# Patient Record
Sex: Female | Born: 1935 | Race: White | Hispanic: No | Smoking: Never smoker
Health system: Southern US, Community
[De-identification: ages and names within clinical notes are randomized; demographics above are authoritative.]

## PROBLEM LIST (undated history)

## (undated) DIAGNOSIS — F32A Depression, unspecified: Secondary | ICD-10-CM

## (undated) DIAGNOSIS — E785 Hyperlipidemia, unspecified: Secondary | ICD-10-CM

## (undated) DIAGNOSIS — F329 Major depressive disorder, single episode, unspecified: Secondary | ICD-10-CM

## (undated) DIAGNOSIS — I10 Essential (primary) hypertension: Secondary | ICD-10-CM

## (undated) DIAGNOSIS — S72002A Fracture of unspecified part of neck of left femur, initial encounter for closed fracture: Secondary | ICD-10-CM

## (undated) DIAGNOSIS — E119 Type 2 diabetes mellitus without complications: Secondary | ICD-10-CM

## (undated) DIAGNOSIS — F0390 Unspecified dementia without behavioral disturbance: Secondary | ICD-10-CM

## (undated) DIAGNOSIS — I509 Heart failure, unspecified: Secondary | ICD-10-CM

## (undated) DIAGNOSIS — D649 Anemia, unspecified: Secondary | ICD-10-CM

## (undated) DIAGNOSIS — I4891 Unspecified atrial fibrillation: Secondary | ICD-10-CM

## (undated) HISTORY — DX: Type 2 diabetes mellitus without complications: E11.9

## (undated) HISTORY — DX: Fracture of unspecified part of neck of left femur, initial encounter for closed fracture: S72.002A

## (undated) HISTORY — DX: Major depressive disorder, single episode, unspecified: F32.9

## (undated) HISTORY — DX: Unspecified atrial fibrillation: I48.91

## (undated) HISTORY — PX: ABDOMINAL HYSTERECTOMY: SHX81

## (undated) HISTORY — PX: INTERTROCHANTERIC HIP FRACTURE SURGERY: SHX681

## (undated) HISTORY — DX: Anemia, unspecified: D64.9

## (undated) HISTORY — DX: Hyperlipidemia, unspecified: E78.5

## (undated) HISTORY — DX: Depression, unspecified: F32.A

---

## 2001-09-23 HISTORY — PX: COLONOSCOPY: SHX174

## 2004-05-25 ENCOUNTER — Ambulatory Visit (HOSPITAL_COMMUNITY): Admission: RE | Admit: 2004-05-25 | Discharge: 2004-05-25 | Payer: Self-pay | Admitting: Family Medicine

## 2004-09-23 DIAGNOSIS — S72002A Fracture of unspecified part of neck of left femur, initial encounter for closed fracture: Secondary | ICD-10-CM

## 2004-09-23 HISTORY — DX: Fracture of unspecified part of neck of left femur, initial encounter for closed fracture: S72.002A

## 2005-05-28 ENCOUNTER — Encounter: Payer: Self-pay | Admitting: Emergency Medicine

## 2005-05-29 ENCOUNTER — Ambulatory Visit: Payer: Self-pay | Admitting: Physical Medicine & Rehabilitation

## 2005-05-29 ENCOUNTER — Inpatient Hospital Stay (HOSPITAL_COMMUNITY): Admission: EM | Admit: 2005-05-29 | Discharge: 2005-06-05 | Payer: Self-pay | Admitting: Emergency Medicine

## 2005-06-05 ENCOUNTER — Inpatient Hospital Stay
Admission: RE | Admit: 2005-06-05 | Discharge: 2005-06-13 | Payer: Self-pay | Admitting: Physical Medicine & Rehabilitation

## 2006-03-06 ENCOUNTER — Ambulatory Visit (HOSPITAL_COMMUNITY): Admission: RE | Admit: 2006-03-06 | Discharge: 2006-03-06 | Payer: Self-pay | Admitting: Family Medicine

## 2011-06-06 ENCOUNTER — Encounter: Payer: Self-pay | Admitting: *Deleted

## 2011-06-06 ENCOUNTER — Emergency Department (HOSPITAL_COMMUNITY)
Admission: EM | Admit: 2011-06-06 | Discharge: 2011-06-07 | Disposition: A | Discharge status: Home / Self Care | Payer: Medicare Other | Attending: Emergency Medicine | Admitting: Emergency Medicine

## 2011-06-06 DIAGNOSIS — I10 Essential (primary) hypertension: Secondary | ICD-10-CM | POA: Insufficient documentation

## 2011-06-06 DIAGNOSIS — E119 Type 2 diabetes mellitus without complications: Secondary | ICD-10-CM | POA: Insufficient documentation

## 2011-06-06 DIAGNOSIS — L0390 Cellulitis, unspecified: Secondary | ICD-10-CM

## 2011-06-06 DIAGNOSIS — T148 Other injury of unspecified body region: Secondary | ICD-10-CM | POA: Insufficient documentation

## 2011-06-06 DIAGNOSIS — W57XXXA Bitten or stung by nonvenomous insect and other nonvenomous arthropods, initial encounter: Secondary | ICD-10-CM | POA: Insufficient documentation

## 2011-06-06 HISTORY — DX: Essential (primary) hypertension: I10

## 2011-06-06 MED ORDER — DOXYCYCLINE HYCLATE 100 MG PO TABS
100.0000 mg | ORAL_TABLET | Freq: Once | ORAL | Status: AC
  Administered 2011-06-06: 100 mg via ORAL
  Filled 2011-06-06: qty 1

## 2011-06-06 MED ORDER — DOXYCYCLINE HYCLATE 100 MG PO CAPS: 100.0000 mg | ORAL_CAPSULE | Freq: Two times a day (BID) | ORAL | Status: AC

## 2011-06-06 MED ORDER — ACETAMINOPHEN 500 MG PO TABS
1000.0000 mg | ORAL_TABLET | Freq: Once | ORAL | Status: AC
  Administered 2011-06-07: 1000 mg via ORAL
  Filled 2011-06-06: qty 2

## 2011-06-06 NOTE — ED Provider Notes (Signed)
Medical screening examination/treatment/procedure(s) were conducted as a shared visit with non-physician practitioner(s) and myself.  I personally evaluated the patient during the encounter  Several days of worsening erythema to the right antecubital fossa, right shoulder. This is warm, associated with fevers and associated with insect bites. Symptoms are mild, persistent nothing makes better or worse  Physical exam: Well appearing, no distress, erythematous rash consistent with cellulitis to the right antecubital fossa and the right posterior shoulder. Lungs and heart was normal exam, mentation normal, gait normal  Assessment, cellulitis of multiple sites, benign-appearing without any toxic appearance,  Plan: Antibiotics, outlined area with marker, followup with family doctor in one to 2 days.  Vida Roller, MD 06/07/11 0000

## 2011-06-06 NOTE — ED Provider Notes (Signed)
History     CSN: [id number] Arrival date & time: 06/06/2011 11:25 PM   Chief Complaint  Patient presents with  . Rash     (Include location/radiation/quality/duration/timing/severity/associated sxs/prior treatment) HPI Comments: Pt describes deer ticks biting her.  Estimates size ~ 1/16th inch.  Patient is a 77 y.o. female presenting with rash. The history is provided by the spouse, a relative and the patient. No language interpreter was used.  Rash  This is a new problem. The current episode started yesterday. The problem has been gradually worsening. The problem is associated with an insect bite/sting (tick bites). There has been no fever. The rash is present on the right arm and back (also midline upper thoracic area.). The patient is experiencing no pain. Associated symptoms include itching. She has tried nothing for the symptoms.     Past Medical History  Diagnosis Date  . Hypertension   . Diabetes mellitus      Past Surgical History  Procedure Date  . Abdominal hysterectomy   . Hip fracture surgery     History reviewed. No pertinent family history.  History  Substance Use Topics  . Smoking status: Never Smoker   . Smokeless tobacco: Not on file  . Alcohol Use: No    OB History    Grav Para Term Preterm Abortions TAB SAB Ect Mult Living                  Review of Systems  Constitutional: Negative for fever and chills.  Skin: Positive for itching and rash.  All other systems reviewed and are negative.    Allergies  Codeine and Merthiolate (new formula)  Home Medications   Current Outpatient Rx  Name Route Sig Dispense Refill  . AMLODIPINE BESYLATE 10 MG PO TABS Oral Take 10 mg by mouth daily.      . ASPIRIN 81 MG PO TABS Oral Take 81 mg by mouth daily.      . ATORVASTATIN CALCIUM 40 MG PO TABS Oral Take 40 mg by mouth daily.      . GLYBURIDE-METFORMIN 5-500 MG PO TABS Oral Take 1 tablet by mouth daily with breakfast.      . PAROXETINE HCL 20 MG PO  TABS Oral Take 20 mg by mouth every morning.      Marland Kitchen RAMIPRIL 10 MG PO TABS Oral Take 10 mg by mouth daily.      Marland Kitchen SITAGLIPTIN PHOSPHATE 100 MG PO TABS Oral Take 100 mg by mouth daily.        Physical Exam    BP 159/76  Pulse 81  Temp(Src) 100.3 F (37.9 C) (Oral)  Resp 18  Wt 225 lb (102.059 kg)  SpO2 96%  Physical Exam  Nursing note and vitals reviewed. Constitutional: She is oriented to person, place, and time. Vital signs are normal. She appears well-developed and well-nourished. No distress.  HENT:  Head: Normocephalic and atraumatic.  Right Ear: External ear normal.  Left Ear: External ear normal.  Nose: Nose normal.  Mouth/Throat: No oropharyngeal exudate.  Eyes: Conjunctivae and EOM are normal. Pupils are equal, round, and reactive to light. Right eye exhibits no discharge. Left eye exhibits no discharge. No scleral icterus.  Neck: Normal range of motion. Neck supple. No JVD present. No tracheal deviation present. No thyromegaly present.  Cardiovascular: Normal rate, regular rhythm, normal heart sounds, intact distal pulses and normal pulses.  Exam reveals no gallop and no friction rub.   No murmur heard. Pulmonary/Chest: Effort normal and  breath sounds normal. No stridor. No respiratory distress. She has no wheezes. She has no rales. She exhibits no tenderness.  Abdominal: Soft. Normal appearance and bowel sounds are normal. She exhibits no distension and no mass. There is no tenderness. There is no rebound and no guarding.  Musculoskeletal: Normal range of motion. She exhibits no edema and no tenderness.  Lymphadenopathy:    She has no cervical adenopathy.  Neurological: She is alert and oriented to person, place, and time. She has normal reflexes. No cranial nerve deficit. Coordination normal. GCS eye subscore is 4. GCS verbal subscore is 5. GCS motor subscore is 6.  Reflex Scores:      Tricep reflexes are 2+ on the right side and 2+ on the left side.      Bicep reflexes  are 2+ on the right side and 2+ on the left side.      Brachioradialis reflexes are 2+ on the right side and 2+ on the left side.      Patellar reflexes are 2+ on the right side and 2+ on the left side.      Achilles reflexes are 2+ on the right side and 2+ on the left side. Skin: Skin is warm and dry. Rash noted. No petechiae noted. Rash is macular. Rash is not pustular and not urticarial. She is not diaphoretic. There is erythema.     Psychiatric: She has a normal mood and affect. Her speech is normal and behavior is normal. Judgment and thought content normal. Cognition and memory are normal.    ED Course  Procedures  No results found for this or any previous visit. No results found.   No diagnosis found.   MDM        Worthy Rancher, PA 06/06/11 [phone]

## 2011-06-06 NOTE — ED Notes (Signed)
Rash to rt upper arm and rt scapular region

## 2012-06-18 ENCOUNTER — Encounter: Payer: Self-pay | Admitting: *Deleted

## 2012-06-18 ENCOUNTER — Encounter: Payer: Self-pay | Admitting: Cardiology

## 2012-06-18 ENCOUNTER — Ambulatory Visit (INDEPENDENT_AMBULATORY_CARE_PROVIDER_SITE_OTHER): Payer: Medicare Other | Admitting: Cardiology

## 2012-06-18 VITALS — BP 143/82 | HR 87 | Ht 66.0 in | Wt 199.1 lb

## 2012-06-18 DIAGNOSIS — E119 Type 2 diabetes mellitus without complications: Secondary | ICD-10-CM | POA: Insufficient documentation

## 2012-06-18 DIAGNOSIS — E785 Hyperlipidemia, unspecified: Secondary | ICD-10-CM

## 2012-06-18 DIAGNOSIS — I10 Essential (primary) hypertension: Secondary | ICD-10-CM | POA: Insufficient documentation

## 2012-06-18 DIAGNOSIS — I4891 Unspecified atrial fibrillation: Secondary | ICD-10-CM

## 2012-06-18 DIAGNOSIS — R079 Chest pain, unspecified: Secondary | ICD-10-CM

## 2012-06-18 MED ORDER — METOPROLOL TARTRATE 25 MG PO TABS: 25.0000 mg | ORAL_TABLET | Freq: Two times a day (BID) | ORAL | Status: DC

## 2012-06-18 NOTE — Progress Notes (Signed)
Patient ID: Glenda Dickson, female   DOB: [birth year], 78 y.o.   MRN: [id number]  HPI: Initial Cardiology evaluation performed at the kind request of Dr. Renard Matter for evaluation of atrial arrhythmias associated with decreased exercise tolerance, dyspnea and chest discomfort.  The patient has enjoyed generally good health, but noted malaise, weakness and exercise intolerance a few weeks ago prompting assessment by Dr. Renard Matter.  EKG showed predominantly atrial fibrillation.  Symptoms had resolved by the time she was seen in the office, and she has felt fine since.  On her best days, ambulation is quite limited due to exercise related fatigue.  She has been overweight for some time, but weight has been decreasing since she started on a diabetic diet.  She has no known cardiac or pulmonary disease, has not previously been evaluated by cardiologist and has not undergone any significant cardiac testing.  Current Outpatient Prescriptions on File Prior to Visit  Medication Sig Dispense Refill  . amLODipine (NORVASC) 10 MG tablet Take 10 mg by mouth daily.        Marland Kitchen aspirin 81 MG tablet Take 81 mg by mouth daily.        Marland Kitchen atorvastatin (LIPITOR) 40 MG tablet Take 40 mg by mouth daily.        . Calcium Citrate-Vitamin D (CITRACAL + D PO) Take by mouth. D3      . glyBURIDE-metformin (GLUCOVANCE) 5-500 MG per tablet Take 1 tablet by mouth 2 (two) times daily with a meal. Pt. Takes 2 Tablets in the morning and 2 tablets at night      . PARoxetine (PAXIL) 20 MG tablet Take 20 mg by mouth every morning.        . ramipril (ALTACE) 10 MG tablet Take 10 mg by mouth daily.        Marland Kitchen tolterodine (DETROL LA) 4 MG 24 hr capsule Take 4 mg by mouth daily.      . metoprolol tartrate (LOPRESSOR) 25 MG tablet Take 1 tablet (25 mg total) by mouth 2 (two) times daily.  60 tablet  11   Allergies  Allergen Reactions  . Benzalkonium Chloride-Alcohol   . Codeine     Past Medical History  Diagnosis Date  . Hypertension   . Diabetes  mellitus type II     no insulin  . Fracture of left hip 2006    intertrochanteric; ORIF  . Hyperlipidemia   . Depression   . Atrial fibrillation     Past Surgical History  Procedure Date  . Abdominal hysterectomy   . Intertrochanteric hip fracture surgery     Fixation    Family History  Problem Relation Age of Onset  . Heart attack Father   . Hypertension Father   . Hypertension Mother   . Heart failure Mother   . Hypertension Sister     Atrial Fib  . Hypertension Brother     4/6 brothers with htn    History   Social History  . Marital Status: Married    Spouse Name: N/A    Number of Children: N/A  . Years of Education: N/A   Occupational History  . Not on file.   Social History Main Topics  . Smoking status: Never Smoker   . Smokeless tobacco: Never Used  . Alcohol Use: No  . Drug Use: No  . Sexually Active: Not on file   Other Topics Concern  . Not on file   Social History Narrative  . No narrative on  file    ROS: Lack of energy, intermittent diarrhea, negative colonoscopy in 2003, occasional diplopia and dizziness, insomnia, cold intolerance, easy bruising.  All other systems reviewed and are negative.  PHYSICAL EXAM: BP 143/82  Pulse 87  Ht 5\' 6"  (1.676 m)  Wt 90.32 kg (199 lb 1.9 oz)  BMI 32.14 kg/m2  SpO2 98% ;  Repeat blood pressure-155/70 General-Well-developed; no acute distress; vague Body Habitus-Moderately overweight HEENT-[place]/AT; PERRL; EOM intact; conjunctiva and lids nl Neck-No JVD; no carotid bruits Endocrine-No thyromegaly Lungs-Clear lung fields; resonant percussion; normal I-to-E ratio Cardiovascular- normal PMI; normal S1 and S2; regular rhythm Abdomen-BS normal; soft and non-tender without masses or organomegaly Musculoskeletal-No deformities, cyanosis or clubbing Neurologic-Nl cranial nerves; symmetric strength and tone; slow and slightly halting speech Skin- Warm, no significant lesions Extremities-Nl distal pulses; no  edema  EKG:  Tracing performed 06/02/2012 obtained and reviewed.  SVT, likely atrial fibrillation, interrupted by occasional sinus beats; nonspecific ST-T wave abnormalities; delayed R-wave progression; voltage criteria for LVH.   Rhythm Strip:  Normal sinus rhythm  ASSESSMENT AND PLAN:  [place] Bing, MD 06/18/2012 2:38 PM

## 2012-06-18 NOTE — Assessment & Plan Note (Signed)
Blood pressure suboptimal in the office today and not routinely checked at home.  Metoprolol will be added both for better control blood pressure and for control of atrial arrhythmias.  Patient will collect additional blood pressure measurements at a local pharmacy, which will be reviewed at her next visit.

## 2012-06-18 NOTE — Patient Instructions (Addendum)
Your physician recommends that you schedule a follow-up appointment in: 3 weeks  Your physician has requested that you have a lexiscan myoview. For further information please visit [email or web address] Please follow instruction sheet, as given.  Your physician has requested that you have an echocardiogram. Echocardiography is a painless test that uses sound waves to create images of your heart. It provides your doctor with information about the size and shape of your heart and how well your heart's chambers and valves are working. This procedure takes approximately one hour. There are no restrictions for this procedure.  Your physician recommends that you return for lab work in: Within the week  Your physician has recommended you make the following change in your medication:  1 - START Metoprolol 25 mg twice a day  Your physician has requested that you regularly monitor and record your blood pressure readings at home. Please use the same machine at the same time of day to check your readings and record them to bring to your follow-up visit.

## 2012-06-18 NOTE — Progress Notes (Deleted)
Name: Glenda Dickson    DOB: [birth year]  Age: 78 y.o.  MR#: [id number]       PCP:  Alice Reichert, MD      Insurance: @PAYORNAME @   CC:   No chief complaint on file.   VS BP 186/84  Pulse 80  Ht 5\' 6"  (1.676 m)  Wt 199 lb 1.9 oz (90.32 kg)  BMI 32.14 kg/m2  SpO2 98%  Weights Current Weight  06/18/12 199 lb 1.9 oz (90.32 kg)  06/06/11 225 lb (102.059 kg)    Blood Pressure  BP Readings from Last 3 Encounters:  06/18/12 186/84  06/06/11 159/76     Admit date:  (Not on file) Last encounter with RMR:  Visit date not found   Allergy Allergies  Allergen Reactions  . Benzalkonium Chloride-Alcohol   . Codeine     Current Outpatient Prescriptions  Medication Sig Dispense Refill  . amLODipine (NORVASC) 10 MG tablet Take 10 mg by mouth daily.        Marland Kitchen aspirin 81 MG tablet Take 81 mg by mouth daily.        Marland Kitchen atorvastatin (LIPITOR) 40 MG tablet Take 40 mg by mouth daily.        . Calcium Citrate-Vitamin D (CITRACAL + D PO) Take by mouth. D3      . glyBURIDE-metformin (GLUCOVANCE) 5-500 MG per tablet Take 1 tablet by mouth 2 (two) times daily with a meal. Pt. Takes 2 Tablets in the morning and 2 tablets at night      . Multiple Vitamin (MULTIVITAMIN) tablet Take 1 tablet by mouth daily.      Marland Kitchen PARoxetine (PAXIL) 20 MG tablet Take 20 mg by mouth every morning.        . ramipril (ALTACE) 10 MG tablet Take 10 mg by mouth daily.        Marland Kitchen tolterodine (DETROL LA) 4 MG 24 hr capsule Take 4 mg by mouth daily.        Discontinued Meds:    Medications Discontinued During This Encounter  Medication Reason  . sitaGLIPtin (JANUVIA) 100 MG tablet Error    Patient Active Problem List  Diagnosis  . Hypertension  . Diabetes mellitus type II  . Hyperlipidemia  . Atrial fibrillation    LABS No results found for any previous visit.   Results for this Opt Visit:    No results found for this or any previous visit.  EKG No orders found for this or any previous visit.   Prior  Assessment and Plan Problem List as of 06/18/2012            Cardiology Problems   Hypertension   Hyperlipidemia   Atrial fibrillation     Other   Diabetes mellitus type II       Imaging: No results found.   FRS Calculation: Score not calculated

## 2012-06-18 NOTE — Assessment & Plan Note (Addendum)
Newly diagnosed atrial arrhythmias.  In the absence of a sustained and prolonged event, I am not inclined to start anticoagulation at present.  She can continue low-dose aspirin.  No aspect of her presentation suggests a significant underlying cause for AF.  An echocardiogram and TSH level will be obtained as well as basic laboratory studies.  Beta blocker will be added to her antihypertensive medications in an attempt to decrease the likelihood of atrial arrhythmias.  Since she developed chest discomfort somewhat worrisome for ischemia around the time of her arrhythmia, we will proceed with a pharmacologic stress nuclear study as well.

## 2012-06-18 NOTE — Assessment & Plan Note (Addendum)
No prior lipid profile available.  One will be obtained.

## 2012-06-23 ENCOUNTER — Ambulatory Visit (HOSPITAL_COMMUNITY)
Admission: RE | Admit: 2012-06-23 | Discharge: 2012-06-23 | Disposition: A | Discharge status: Home / Self Care | Payer: Medicare Other | Source: Ambulatory Visit | Attending: Cardiology | Admitting: Cardiology

## 2012-06-23 ENCOUNTER — Encounter (HOSPITAL_COMMUNITY)
Admission: RE | Admit: 2012-06-23 | Discharge: 2012-06-23 | Disposition: A | Discharge status: Home / Self Care | Payer: Medicare Other | Source: Ambulatory Visit | Attending: Cardiology | Admitting: Cardiology

## 2012-06-23 ENCOUNTER — Encounter (HOSPITAL_COMMUNITY): Payer: Self-pay | Admitting: Cardiology

## 2012-06-23 ENCOUNTER — Encounter (HOSPITAL_COMMUNITY): Payer: Self-pay

## 2012-06-23 ENCOUNTER — Encounter: Payer: Self-pay | Admitting: *Deleted

## 2012-06-23 DIAGNOSIS — R079 Chest pain, unspecified: Secondary | ICD-10-CM

## 2012-06-23 DIAGNOSIS — I4891 Unspecified atrial fibrillation: Secondary | ICD-10-CM

## 2012-06-23 DIAGNOSIS — I369 Nonrheumatic tricuspid valve disorder, unspecified: Secondary | ICD-10-CM

## 2012-06-23 DIAGNOSIS — I10 Essential (primary) hypertension: Secondary | ICD-10-CM | POA: Insufficient documentation

## 2012-06-23 DIAGNOSIS — I2589 Other forms of chronic ischemic heart disease: Secondary | ICD-10-CM

## 2012-06-23 DIAGNOSIS — R0609 Other forms of dyspnea: Secondary | ICD-10-CM | POA: Insufficient documentation

## 2012-06-23 DIAGNOSIS — E119 Type 2 diabetes mellitus without complications: Secondary | ICD-10-CM | POA: Insufficient documentation

## 2012-06-23 DIAGNOSIS — E785 Hyperlipidemia, unspecified: Secondary | ICD-10-CM | POA: Insufficient documentation

## 2012-06-23 DIAGNOSIS — R0989 Other specified symptoms and signs involving the circulatory and respiratory systems: Secondary | ICD-10-CM | POA: Insufficient documentation

## 2012-06-23 MED ORDER — REGADENOSON 0.4 MG/5ML IV SOLN
INTRAVENOUS | Status: AC
  Administered 2012-06-23: 0.4 mg via INTRAVENOUS
  Filled 2012-06-23: qty 5

## 2012-06-23 MED ORDER — SODIUM CHLORIDE 0.9 % IJ SOLN
INTRAMUSCULAR | Status: AC
  Administered 2012-06-23: 10 mL via INTRAVENOUS
  Filled 2012-06-23: qty 10

## 2012-06-23 MED ORDER — TECHNETIUM TC 99M SESTAMIBI - CARDIOLITE
10.0000 | Freq: Once | INTRAVENOUS | Status: AC | PRN
  Administered 2012-06-23: 10 via INTRAVENOUS

## 2012-06-23 MED ORDER — TECHNETIUM TC 99M SESTAMIBI - CARDIOLITE
30.0000 | Freq: Once | INTRAVENOUS | Status: AC | PRN
  Administered 2012-06-23: 32 via INTRAVENOUS

## 2012-06-23 NOTE — Progress Notes (Signed)
*  PRELIMINARY RESULTS* Echocardiogram 2D Echocardiogram has been performed.  Glenda Dickson M 06/23/2012, 11:50 AM

## 2012-06-23 NOTE — Progress Notes (Signed)
Stress Lab Nurses Notes - [facility]  KAEDEN DEPAZ 06/23/2012 Reason for doing test: Afib, chest pain & Dyspnea Type of test: Marlane Hatcher Nurse performing test: Parke Poisson, RN Nuclear Medicine Tech: Lou Cal Echo Tech: Not Applicable MD performing test: Ival Bible Family MD: [facility] Test explained and consent signed: yes IV started: 22g jelco, Saline lock flushed, No redness or edema and Saline lock started in radiology Symptoms: dizziness ( "Funny feeling in head) Treatment/Intervention: None Reason test stopped: protocol completed After recovery IV was: Discontinued via X-ray tech and No redness or edema Patient to return to Nuc. Med at : 12:00 Patient discharged: Home Patient's Condition upon discharge was: stable Comments; During test BP 113/44 & HR 85.  Recovery BP 128/48 7 HR 78.  Symptoms resolved in recovery. Erskine Speed T

## 2012-06-25 ENCOUNTER — Other Ambulatory Visit: Payer: Self-pay | Admitting: Cardiology

## 2012-06-25 LAB — COMPREHENSIVE METABOLIC PANEL
AST: 11 U/L (ref 0–37)
Albumin: 4.1 g/dL (ref 3.5–5.2)
Calcium: 9.5 mg/dL (ref 8.4–10.5)
Potassium: 4.2 mEq/L (ref 3.5–5.3)
Total Bilirubin: 0.9 mg/dL (ref 0.3–1.2)
Total Protein: 6.6 g/dL (ref 6.0–8.3)

## 2012-06-25 LAB — CBC
HCT: 33.7 % — ABNORMAL LOW (ref 36.0–46.0)
Hemoglobin: 11.2 g/dL — ABNORMAL LOW (ref 12.0–15.0)
MCV: 84 fL (ref 78.0–100.0)
Platelets: 165 10*3/uL (ref 150–400)
RBC: 4.01 MIL/uL (ref 3.87–5.11)
WBC: 5 10*3/uL (ref 4.0–10.5)

## 2012-06-25 LAB — LIPID PANEL
Total CHOL/HDL Ratio: 3.7 Ratio
Triglycerides: 105 mg/dL (ref <150)

## 2012-06-26 ENCOUNTER — Encounter: Payer: Self-pay | Admitting: *Deleted

## 2012-06-26 ENCOUNTER — Encounter: Payer: Self-pay | Admitting: Cardiology

## 2012-06-26 DIAGNOSIS — D649 Anemia, unspecified: Secondary | ICD-10-CM

## 2012-06-26 HISTORY — DX: Anemia, unspecified: D64.9

## 2012-07-20 ENCOUNTER — Ambulatory Visit (INDEPENDENT_AMBULATORY_CARE_PROVIDER_SITE_OTHER): Payer: Medicare Other | Admitting: Cardiology

## 2012-07-20 ENCOUNTER — Encounter: Payer: Self-pay | Admitting: Cardiology

## 2012-07-20 VITALS — BP 185/78 | HR 72 | Ht 66.0 in | Wt 202.4 lb

## 2012-07-20 DIAGNOSIS — I10 Essential (primary) hypertension: Secondary | ICD-10-CM

## 2012-07-20 DIAGNOSIS — R0989 Other specified symptoms and signs involving the circulatory and respiratory systems: Secondary | ICD-10-CM

## 2012-07-20 DIAGNOSIS — I4891 Unspecified atrial fibrillation: Secondary | ICD-10-CM

## 2012-07-20 DIAGNOSIS — D649 Anemia, unspecified: Secondary | ICD-10-CM

## 2012-07-20 DIAGNOSIS — I679 Cerebrovascular disease, unspecified: Secondary | ICD-10-CM | POA: Insufficient documentation

## 2012-07-20 MED ORDER — METOPROLOL TARTRATE 25 MG PO TABS: 25.0000 mg | ORAL_TABLET | Freq: Two times a day (BID) | ORAL | Status: DC

## 2012-07-20 MED ORDER — CHLORTHALIDONE 25 MG PO TABS: 12.5000 mg | ORAL_TABLET | Freq: Every day | ORAL | Status: DC

## 2012-07-20 MED ORDER — METOPROLOL TARTRATE 50 MG PO TABS: 25.0000 mg | ORAL_TABLET | Freq: Two times a day (BID) | ORAL | Status: DC

## 2012-07-20 NOTE — Patient Instructions (Addendum)
Your physician recommends that you schedule a follow-up appointment in: 1 month  Your physician has requested that you regularly monitor and record your blood pressure readings at home. Please use the same machine at the same time of day to check your readings and record them to bring to your follow-up visit.  Your physician has requested that you have a carotid duplex. This test is an ultrasound of the carotid arteries in your neck. It looks at blood flow through these arteries that supply the brain with blood. Allow one hour for this exam. There are no restrictions or special instructions.  Your physician recommends that you return for lab work in: 3 weeks (you will receive a letter)  Your physician has recommended you make the following change in your medication:  1 - Chlorthalidone 12.5 mg daily

## 2012-07-20 NOTE — Progress Notes (Signed)
Patient ID: CHALESE Dickson, female   DOB: 1927-11-11, 78 y.o.   MRN: [id number]  HPI: Scheduled return visit for this nice woman with paroxysmal atrial fibrillation.  Since her initial episode, she has had no recurrent symptoms suggestive of arrhythmia.  She remains physically inactive and admits that she has not been perfectly compliant with her medication.  She has easy bruising, which she now notes creates more of a reddish color than deep purple.  Prior to Admission medications   Medication Sig Start Date End Date Taking? Authorizing Provider  amLODipine (NORVASC) 10 MG tablet Take 10 mg by mouth daily.     Yes Historical Provider, MD  aspirin 81 MG tablet Take 81 mg by mouth daily.     Yes Historical Provider, MD  atorvastatin (LIPITOR) 40 MG tablet Take 40 mg by mouth daily.     Yes Historical Provider, MD  Calcium Citrate-Vitamin D (CITRACAL + D PO) Take by mouth. D3   Yes Historical Provider, MD  glyBURIDE-metformin (GLUCOVANCE) 5-500 MG per tablet Take 1 tablet by mouth 2 (two) times daily with a meal. Pt. Takes 2 Tablets in the morning and 2 tablets at night   Yes Historical Provider, MD  metoprolol tartrate (LOPRESSOR) 25 MG tablet Take 1 tablet (25 mg total) by mouth 2 (two) times daily. 06/18/12  Yes Kathlen Brunswick, MD  Multiple Vitamin (MULTIVITAMIN) tablet Take 1 tablet by mouth daily.   Yes Historical Provider, MD  PARoxetine (PAXIL) 20 MG tablet Take 20 mg by mouth every morning.     Yes Historical Provider, MD  ramipril (ALTACE) 10 MG tablet Take 10 mg by mouth daily.     Yes Historical Provider, MD  tolterodine (DETROL LA) 4 MG 24 hr capsule Take 4 mg by mouth daily.   Yes Historical Provider, MD   Allergies  Allergen Reactions  . Benzalkonium Chloride-Alcohol   . Codeine      Past medical history, social history, and family history reviewed and updated.  ROS: Denies palpitations, lightheadedness or syncope.  No chest discomfort or dyspnea with a sedentary lifestyle.  All  other systems reviewed and are negative.  PHYSICAL EXAM: BP 185/78  Pulse 72  Ht 5\' 6"  (1.676 m)  Wt 91.808 kg (202 lb 6.4 oz)  BMI 32.67 kg/m2  General-Well developed; no acute distress Body habitus-Overweight Neck-No JVD; Left carotid bruit Lungs-clear lung fields; resonant to percussion Cardiovascular-normal PMI; normal S1 and S2; regular rhythm; grade 1/6 basilar systolic ejection murmur Abdomen-normal bowel sounds; soft and non-tender without masses or organomegaly Musculoskeletal-No deformities, no cyanosis or clubbing Neurologic-Normal cranial nerves; symmetric strength and tone Skin-Warm, no significant lesions Extremities-distal pulses intact; 1+ edema  ASSESSMENT AND PLAN:   Bing, MD 07/20/2012 2:14 PM

## 2012-07-20 NOTE — Assessment & Plan Note (Addendum)
Optimal control of hypertension will be pursued aggressively.  Chlorthalidone 12.5 mg q.d will be added to her medical regime.  Family is reluctant to advance drugs more decisively, as she failed to take her medications this morning.  Improved compliance emphasized.  Home blood pressure recordings requested.  Electrolytes and renal function will be monitored

## 2012-07-20 NOTE — Assessment & Plan Note (Addendum)
Mild anemia; Dr. Renard Matter may wish to pursue additional testing, if not previously obtained.

## 2012-07-20 NOTE — Progress Notes (Deleted)
Name: Glenda Dickson    DOB: 05-30-1928  Age: 78 y.o.  MR#: [id number]       PCP:  Alice Reichert, MD      Insurance: @PAYORNAME @   CC:   No chief complaint on file.  MEDICATION LIST  VS BP 185/78  Pulse 72  Ht 5\' 6"  (1.676 m)  Wt 202 lb 6.4 oz (91.808 kg)  BMI 32.67 kg/m2  Weights Current Weight  07/20/12 202 lb 6.4 oz (91.808 kg)  06/18/12 199 lb 1.9 oz (90.32 kg)  06/06/11 225 lb (102.059 kg)    Blood Pressure  BP Readings from Last 3 Encounters:  07/20/12 185/78  06/18/12 143/82  06/06/11 159/76     Admit date:  (Not on file) Last encounter with RMR:  06/26/2012   Allergy Allergies  Allergen Reactions  . Benzalkonium Chloride-Alcohol   . Codeine     Current Outpatient Prescriptions  Medication Sig Dispense Refill  . amLODipine (NORVASC) 10 MG tablet Take 10 mg by mouth daily.        Marland Kitchen aspirin 81 MG tablet Take 81 mg by mouth daily.        Marland Kitchen atorvastatin (LIPITOR) 40 MG tablet Take 40 mg by mouth daily.        . Calcium Citrate-Vitamin D (CITRACAL + D PO) Take by mouth. D3      . glyBURIDE-metformin (GLUCOVANCE) 5-500 MG per tablet Take 1 tablet by mouth 2 (two) times daily with a meal. Pt. Takes 2 Tablets in the morning and 2 tablets at night      . metoprolol tartrate (LOPRESSOR) 25 MG tablet Take 1 tablet (25 mg total) by mouth 2 (two) times daily.  60 tablet  11  . Multiple Vitamin (MULTIVITAMIN) tablet Take 1 tablet by mouth daily.      Marland Kitchen PARoxetine (PAXIL) 20 MG tablet Take 20 mg by mouth every morning.        . ramipril (ALTACE) 10 MG tablet Take 10 mg by mouth daily.        Marland Kitchen tolterodine (DETROL LA) 4 MG 24 hr capsule Take 4 mg by mouth daily.        Discontinued Meds:   There are no discontinued medications.  Patient Active Problem List  Diagnosis  . Hypertension  . Diabetes mellitus type II  . Hyperlipidemia  . Atrial fibrillation  . Anemia    LABS Orders Only on 06/25/2012  Component Date Value  . Sodium 06/25/2012 142   . Potassium  06/25/2012 4.2   . Chloride 06/25/2012 105   . CO2 06/25/2012 25   . Glucose, Bld 06/25/2012 78   . BUN 06/25/2012 32*  . Creat 06/25/2012 1.20*  . Total Bilirubin 06/25/2012 0.9   . Alkaline Phosphatase 06/25/2012 82   . AST 06/25/2012 11   . ALT 06/25/2012 8   . Total Protein 06/25/2012 6.6   . Albumin 06/25/2012 4.1   . Calcium 06/25/2012 9.5   Office Visit on 06/18/2012  Component Date Value  . WBC 06/25/2012 5.0   . RBC 06/25/2012 4.01   . Hemoglobin 06/25/2012 11.2*  . HCT 06/25/2012 33.7*  . MCV 06/25/2012 84.0   . MCH 06/25/2012 27.9   . MCHC 06/25/2012 33.2   . RDW 06/25/2012 14.8   . Platelets 06/25/2012 165   . TSH 06/25/2012 5.046*  . Cholesterol 06/25/2012 156   . Triglycerides 06/25/2012 105   . HDL 06/25/2012 42   . Total CHOL/HDL Ratio 06/25/2012 3.7   .  VLDL 06/25/2012 21   . LDL Cholesterol 06/25/2012 93      Results for this Opt Visit:     Results for orders placed in visit on 06/25/12  COMPREHENSIVE METABOLIC PANEL      Component Value Range   Sodium 142  135 - 145 mEq/L   Potassium 4.2  3.5 - 5.3 mEq/L   Chloride 105  96 - 112 mEq/L   CO2 25  19 - 32 mEq/L   Glucose, Bld 78  70 - 99 mg/dL   BUN 32 (*) 6 - 23 mg/dL   Creat 1.61 (*) 0.96 - 1.10 mg/dL   Total Bilirubin 0.9  0.3 - 1.2 mg/dL   Alkaline Phosphatase 82  39 - 117 U/L   AST 11  0 - 37 U/L   ALT 8  0 - 35 U/L   Total Protein 6.6  6.0 - 8.3 g/dL   Albumin 4.1  3.5 - 5.2 g/dL   Calcium 9.5  8.4 - 04.5 mg/dL    EKG No orders found for this or any previous visit.   Prior Assessment and Plan Problem List as of 07/20/2012            Cardiology Problems   Hypertension   Last Assessment & Plan Note   06/18/2012 Office Visit Signed 06/18/2012  2:49 PM by Kathlen Brunswick, MD    Blood pressure suboptimal in the office today and not routinely checked at home.  Metoprolol will be added both for better control blood pressure and for control of atrial arrhythmias.  Patient will collect  additional blood pressure measurements at a local pharmacy, which will be reviewed at her next visit.    Hyperlipidemia   Last Assessment & Plan Note   06/18/2012 Office Visit Addendum 06/18/2012  2:47 PM by Kathlen Brunswick, MD    No prior lipid profile available.  One will be obtained.    Atrial fibrillation   Last Assessment & Plan Note   06/18/2012 Office Visit Addendum 06/23/2012  9:21 PM by Kathlen Brunswick, MD    Newly diagnosed atrial arrhythmias.  In the absence of a sustained and prolonged event, I am not inclined to start anticoagulation at present.  She can continue low-dose aspirin.  No aspect of her presentation suggests a significant underlying cause for AF.  An echocardiogram and TSH level will be obtained as well as basic laboratory studies.  Beta blocker will be added to her antihypertensive medications in an attempt to decrease the likelihood of atrial arrhythmias.  Since she developed chest discomfort somewhat worrisome for ischemia around the time of her arrhythmia, we will proceed with a pharmacologic stress nuclear study as well.      Other   Diabetes mellitus type II   Anemia       Imaging: Nm Myocar Single W/spect W/wall Motion And Ef  06/23/2012  Ordering Physician: Danielson Bing  Reading Physician: Jonelle Sidle  Clinical Data: 78 year old woman with history of hypertension, diabetes, hyperlipidemia, and atrial fibrillation, now referred for the assessment of underlying ischemia.  NUCLEAR MEDICINE STRESS MYOVIEW STUDY WITH SPECT AND LEFT VENTRICULAR EJECTION FRACTION  Radionuclide Data: One-day rest/stress protocol performed with 10/30 mCi of Tc-63m Myoview.  Stress Data: Lexican bolus was given in standard fashion.  Heart rate increased from 68 beats per minute up to 85 beats per minute, and blood pressure remained stable 139/66.  The patient tolerated infusion well.  There were no diagnostic ST-segment abnormalities. Rare PVC  was noted.  No arrhythmias.  EKG:  Baseline tracing shows sinus rhythm at 66 beats per minute, decreased R-wave progression.  Scintigraphic Data: Analysis of the raw perfusion data finds significant breast attenuation.  Tomographic views were obtained using the short axis, vertical long axis, and horizontal long axis planes.  There are mild to moderate intensity defects noted within the apical anteroseptal wall as well as mid to basal inferolateral wall.  These regions are fixed, actually more prominent at rest, and suggestive of attenuation artifact rather than scar.  No large ischemic zones are noted.  Gated imaging reveals an EDV of 80, ESV of 26, T I D ratio 0.96, and LVEF of 68%.  IMPRESSION: Low risk Lexiscan Myoview.  No diagnostic ST-segment abnormalities were noted.  There is evidence of soft tissue/breast attenuation affecting the apical anteroseptal wall and mid to basal inferolateral wall, without definite evidence of scar or ischemia. LVEF is 68%.   Original Report Authenticated By: Sena Hitch Calculation: Score not calculated

## 2012-07-20 NOTE — Assessment & Plan Note (Addendum)
No predisposing factors to atrial fibrillation other than age and presence of hypertension.  Although she has multiple risk factors for thromboembolism, I am not inclined to commit her to this for one brief documented episode of atrial fibrillation.  Improved control of hypertension may reduce the likelihood of recurrence.

## 2012-07-20 NOTE — Assessment & Plan Note (Signed)
Carotid bruit present.  Carotid ultrasound study will be performed.

## 2012-07-24 ENCOUNTER — Ambulatory Visit (HOSPITAL_COMMUNITY)
Admission: RE | Admit: 2012-07-24 | Discharge: 2012-07-24 | Disposition: A | Discharge status: Home / Self Care | Payer: Medicare Other | Source: Ambulatory Visit | Attending: Cardiology | Admitting: Cardiology

## 2012-07-24 DIAGNOSIS — R0989 Other specified symptoms and signs involving the circulatory and respiratory systems: Secondary | ICD-10-CM | POA: Insufficient documentation

## 2012-07-24 DIAGNOSIS — I6529 Occlusion and stenosis of unspecified carotid artery: Secondary | ICD-10-CM | POA: Insufficient documentation

## 2012-07-24 DIAGNOSIS — I658 Occlusion and stenosis of other precerebral arteries: Secondary | ICD-10-CM | POA: Insufficient documentation

## 2012-08-24 ENCOUNTER — Ambulatory Visit (INDEPENDENT_AMBULATORY_CARE_PROVIDER_SITE_OTHER): Payer: Medicare Other | Admitting: Cardiology

## 2012-08-24 ENCOUNTER — Encounter: Payer: Self-pay | Admitting: Cardiology

## 2012-08-24 VITALS — BP 160/78 | HR 72 | Ht 66.0 in | Wt 202.0 lb

## 2012-08-24 DIAGNOSIS — I4891 Unspecified atrial fibrillation: Secondary | ICD-10-CM

## 2012-08-24 DIAGNOSIS — N189 Chronic kidney disease, unspecified: Secondary | ICD-10-CM

## 2012-08-24 DIAGNOSIS — D649 Anemia, unspecified: Secondary | ICD-10-CM

## 2012-08-24 DIAGNOSIS — N184 Chronic kidney disease, stage 4 (severe): Secondary | ICD-10-CM | POA: Insufficient documentation

## 2012-08-24 DIAGNOSIS — I10 Essential (primary) hypertension: Secondary | ICD-10-CM

## 2012-08-24 DIAGNOSIS — I679 Cerebrovascular disease, unspecified: Secondary | ICD-10-CM

## 2012-08-24 DIAGNOSIS — E785 Hyperlipidemia, unspecified: Secondary | ICD-10-CM

## 2012-08-24 NOTE — Progress Notes (Deleted)
Name: Glenda Dickson    DOB: Sep 20, 1928  Age: 78 y.o.  MR#: [id number]       PCP:  Alice Reichert, MD      Insurance: @PAYORNAME @   CC:   No chief complaint on file.  MEDICATION BOTTLES BP LIST CUS 07/24/12  VS BP 160/78  Pulse 72  Ht 5\' 6"  (1.676 m)  Wt 202 lb (91.627 kg)  BMI 32.60 kg/m2  SpO2 94%  Weights Current Weight  08/24/12 202 lb (91.627 kg)  07/20/12 202 lb 6.4 oz (91.808 kg)  06/18/12 199 lb 1.9 oz (90.32 kg)    Blood Pressure  BP Readings from Last 3 Encounters:  08/24/12 160/78  07/20/12 185/78  06/18/12 143/82     Admit date:  (Not on file) Last encounter with RMR:  07/20/2012   Allergy Allergies  Allergen Reactions  . Benzalkonium Chloride-Alcohol   . Codeine     Current Outpatient Prescriptions  Medication Sig Dispense Refill  . amLODipine (NORVASC) 10 MG tablet Take 10 mg by mouth daily.        Marland Kitchen aspirin 81 MG tablet Take 81 mg by mouth daily.        Marland Kitchen atorvastatin (LIPITOR) 40 MG tablet Take 40 mg by mouth daily.        . Calcium Citrate-Vitamin D (CITRACAL + D PO) Take by mouth. D3      . chlorthalidone (HYGROTON) 25 MG tablet Take 0.5 tablets (12.5 mg total) by mouth daily.  30 tablet  6  . glyBURIDE-metformin (GLUCOVANCE) 5-500 MG per tablet Take 1 tablet by mouth 2 (two) times daily with a meal. Pt. Takes 2 Tablets in the morning and 2 tablets at night      . metoprolol tartrate (LOPRESSOR) 25 MG tablet Take 1 tablet (25 mg total) by mouth 2 (two) times daily.  60 tablet  11  . Multiple Vitamin (MULTIVITAMIN) tablet Take 1 tablet by mouth daily.      Marland Kitchen PARoxetine (PAXIL) 20 MG tablet Take 20 mg by mouth every morning.        . ramipril (ALTACE) 10 MG tablet Take 10 mg by mouth daily.        Marland Kitchen tolterodine (DETROL LA) 4 MG 24 hr capsule Take 4 mg by mouth daily.        Discontinued Meds:   There are no discontinued medications.  Patient Active Problem List  Diagnosis  . Hypertension  . Diabetes mellitus type II  . Hyperlipidemia  .  Atrial fibrillation  . Anemia  . Cerebrovascular disease    LABS Orders Only on 06/25/2012  Component Date Value  . Sodium 06/25/2012 142   . Potassium 06/25/2012 4.2   . Chloride 06/25/2012 105   . CO2 06/25/2012 25   . Glucose, Bld 06/25/2012 78   . BUN 06/25/2012 32*  . Creat 06/25/2012 1.20*  . Total Bilirubin 06/25/2012 0.9   . Alkaline Phosphatase 06/25/2012 82   . AST 06/25/2012 11   . ALT 06/25/2012 8   . Total Protein 06/25/2012 6.6   . Albumin 06/25/2012 4.1   . Calcium 06/25/2012 9.5   Office Visit on 06/18/2012  Component Date Value  . WBC 06/25/2012 5.0   . RBC 06/25/2012 4.01   . Hemoglobin 06/25/2012 11.2*  . HCT 06/25/2012 33.7*  . MCV 06/25/2012 84.0   . MCH 06/25/2012 27.9   . MCHC 06/25/2012 33.2   . RDW 06/25/2012 14.8   . Platelets 06/25/2012 165   .  TSH 06/25/2012 5.046*  . Cholesterol 06/25/2012 156   . Triglycerides 06/25/2012 105   . HDL 06/25/2012 42   . Total CHOL/HDL Ratio 06/25/2012 3.7   . VLDL 06/25/2012 21   . LDL Cholesterol 06/25/2012 93      Results for this Opt Visit:     Results for orders placed in visit on 06/25/12  COMPREHENSIVE METABOLIC PANEL      Component Value Range   Sodium 142  135 - 145 mEq/L   Potassium 4.2  3.5 - 5.3 mEq/L   Chloride 105  96 - 112 mEq/L   CO2 25  19 - 32 mEq/L   Glucose, Bld 78  70 - 99 mg/dL   BUN 32 (*) 6 - 23 mg/dL   Creat 1.61 (*) 0.96 - 1.10 mg/dL   Total Bilirubin 0.9  0.3 - 1.2 mg/dL   Alkaline Phosphatase 82  39 - 117 U/L   AST 11  0 - 37 U/L   ALT 8  0 - 35 U/L   Total Protein 6.6  6.0 - 8.3 g/dL   Albumin 4.1  3.5 - 5.2 g/dL   Calcium 9.5  8.4 - 04.5 mg/dL    EKG No orders found for this or any previous visit.   Prior Assessment and Plan Problem List as of 08/24/2012            Cardiology Problems   Hypertension   Last Assessment & Plan Note   07/20/2012 Office Visit Addendum 07/25/2012 12:19 PM by Kathlen Brunswick, MD    Optimal control of hypertension will be pursued  aggressively.  Chlorthalidone 12.5 mg q.d will be added to her medical regime.  Family is reluctant to advance drugs more decisively, as she failed to take her medications this morning.  Improved compliance emphasized.  Home blood pressure recordings requested.  Electrolytes and renal function will be monitored    Hyperlipidemia   Last Assessment & Plan Note   06/18/2012 Office Visit Addendum 06/18/2012  2:47 PM by Kathlen Brunswick, MD    No prior lipid profile available.  One will be obtained.    Atrial fibrillation   Last Assessment & Plan Note   07/20/2012 Office Visit Addendum 07/25/2012 12:19 PM by Kathlen Brunswick, MD    No predisposing factors to atrial fibrillation other than age and presence of hypertension.  Although she has multiple risk factors for thromboembolism, I am not inclined to commit her to this for one brief documented episode of atrial fibrillation.  Improved control of hypertension may reduce the likelihood of recurrence.    Cerebrovascular disease   Last Assessment & Plan Note   07/20/2012 Office Visit Signed 07/20/2012  7:57 PM by Kathlen Brunswick, MD    Carotid bruit present.  Carotid ultrasound study will be performed.      Other   Diabetes mellitus type II   Anemia   Last Assessment & Plan Note   07/20/2012 Office Visit Addendum 07/20/2012  8:05 PM by Kathlen Brunswick, MD    Mild anemia; Dr. Renard Matter may wish to pursue additional testing, if not previously obtained.        Imaging: No results found.   FRS Calculation: Score not calculated

## 2012-08-24 NOTE — Patient Instructions (Addendum)
Your physician recommends that you schedule a follow-up appointment in: 9 months  Stool Cards x 3 and return asap  Your physician has requested that you regularly monitor and record your blood pressure readings at home. Please use the same machine at the same time of day to check your readings and record them to bring to your follow-up visit.  Call us if upper number is > 140  Your physician recommends that you return for lab work in: 1 month (you will receive a letter)

## 2012-08-24 NOTE — Assessment & Plan Note (Signed)
No symptoms to suggest recurrent atrial fibrillation.  Current management does not include chronic anticoagulation.

## 2012-08-24 NOTE — Assessment & Plan Note (Addendum)
Patient has significant anemia.  Stool for Hemoccult testing will be obtained as well as repeat CBC and iron studies.  The patient advised to schedule a return visit to Dr. Renard Matter for additional assessment and treatment.  Colonoscopy might be worthwhile, as she appears to be overdue for testing, to identify occult disease of the colon.

## 2012-08-24 NOTE — Progress Notes (Signed)
Patient ID: Glenda Dickson, female   DOB: 1928-05-20, 78 y.o.   MRN: [id number]  HPI: Scheduled return visit for this lovely older woman with paroxysmal atrial fibrillation, hypertension and hyperlipidemia.  Since her last visit, she has been generally well.  She was unwilling to start treatment with chlorthalidone, preferring to obtain additional baseline blood pressure measurements.  She has had no problem with anticoagulation.  Colonoscopy was performed many years ago.  She did not antihypertensive medication yesterday, she was staying with relatives and did not have her medication.  A log of BP results shows approximately half of 8 BP measurements with systolics between 140-150.    Prior to Admission medications   Medication Sig Start Date End Date Taking? Authorizing Provider  amLODipine (NORVASC) 10 MG tablet Take 10 mg by mouth daily.     Yes Historical Provider, MD  aspirin 81 MG tablet Take 81 mg by mouth daily.     Yes Historical Provider, MD  atorvastatin (LIPITOR) 40 MG tablet Take 40 mg by mouth daily.     Yes Historical Provider, MD  Calcium Citrate-Vitamin D (CITRACAL + D PO) Take by mouth. D3   Yes Historical Provider, MD  chlorthalidone (HYGROTON) 25 MG tablet Take 0.5 tablets (12.5 mg total) by mouth daily. 07/20/12  Yes Kathlen Brunswick, MD  glyBURIDE-metformin (GLUCOVANCE) 5-500 MG per tablet Take 1 tablet by mouth 2 (two) times daily with a meal. Pt. Takes 2 Tablets in the morning and 2 tablets at night   Yes Historical Provider, MD  metoprolol tartrate (LOPRESSOR) 25 MG tablet Take 1 tablet (25 mg total) by mouth 2 (two) times daily. 07/20/12 07/20/13 Yes Kathlen Brunswick, MD  Multiple Vitamin (MULTIVITAMIN) tablet Take 1 tablet by mouth daily.   Yes Historical Provider, MD  PARoxetine (PAXIL) 20 MG tablet Take 20 mg by mouth every morning.     Yes Historical Provider, MD  ramipril (ALTACE) 10 MG tablet Take 10 mg by mouth daily.     Yes Historical Provider, MD  tolterodine  (DETROL LA) 4 MG 24 hr capsule Take 4 mg by mouth daily.   Yes Historical Provider, MD   Allergies  Allergen Reactions  . Benzalkonium Chloride-Alcohol   . Codeine   Past medical history, social history, and family history reviewed and updated.  ROS: Denies chest pain, dyspnea, orthopnea, PND, lightheadedness or syncope.  No hematemesis, hematochezia or melena.  All other systems reviewed and are negative.  PHYSICAL EXAM: BP 160/78  Pulse 72  Ht 5\' 6"  (1.676 m)  Wt 91.627 kg (202 lb)  BMI 32.60 kg/m2  SpO2 94% ; repeat blood pressure 165/75 in the right arm sitting General-Well developed; no acute distress Body habitus-proportionate weight and height Neck-No JVD; Right carotid bruit Lungs-clear lung fields; resonant to percussion Cardiovascular-normal PMI; normal S1 and S2; + S4, grade 1/6 systolic ejection murmur at the cardiac base Abdomen-normal bowel sounds; soft and non-tender without masses or organomegaly Musculoskeletal-No deformities, no cyanosis or clubbing Neurologic-Normal cranial nerves; symmetric strength and tone Skin-Warm, no significant lesions Extremities-distal pulses intact; 1+ edema on the left, 1/2+ on right  ASSESSMENT AND PLAN:  [place] Bing, MD 08/24/2012 2:05 PM

## 2012-08-24 NOTE — Assessment & Plan Note (Signed)
Good lipid profile on moderate dose atorvastatin, which will be continued.

## 2012-08-24 NOTE — Assessment & Plan Note (Signed)
Blood pressure control is suboptimal.  Patient will begin a thiazide diuretic as previously ordered and attempt to monitor blood pressure.  She will report systolic greater than 140 If these occur more than occasionally.

## 2012-08-24 NOTE — Assessment & Plan Note (Addendum)
Cerebrovascular disease is mild and asymptomatic, but there has been progression.  Aspirin and lipid lowering therapy represent adequate treatment.

## 2012-08-25 ENCOUNTER — Encounter: Payer: Self-pay | Admitting: *Deleted

## 2012-09-09 ENCOUNTER — Ambulatory Visit (INDEPENDENT_AMBULATORY_CARE_PROVIDER_SITE_OTHER): Payer: Medicare Other | Admitting: *Deleted

## 2012-09-09 DIAGNOSIS — I4891 Unspecified atrial fibrillation: Secondary | ICD-10-CM

## 2012-09-29 ENCOUNTER — Telehealth: Payer: Self-pay | Admitting: Cardiology

## 2012-09-29 NOTE — Telephone Encounter (Signed)
Patient has questions regarding lab work. / tgs

## 2012-09-29 NOTE — Telephone Encounter (Signed)
Verified lab work date

## 2012-10-01 ENCOUNTER — Other Ambulatory Visit: Payer: Self-pay | Admitting: Cardiology

## 2012-10-01 LAB — IRON AND TIBC
TIBC: 330 ug/dL (ref 250–470)
UIBC: 272 ug/dL (ref 125–400)

## 2012-10-01 LAB — CBC
HCT: 30.6 % — ABNORMAL LOW (ref 36.0–46.0)
Hemoglobin: 10.4 g/dL — ABNORMAL LOW (ref 12.0–15.0)
MCH: 29 pg (ref 26.0–34.0)
MCHC: 34 g/dL (ref 30.0–36.0)
MCV: 85.2 fL (ref 78.0–100.0)
Platelets: 146 10*3/uL — ABNORMAL LOW (ref 150–400)
RBC: 3.59 MIL/uL — ABNORMAL LOW (ref 3.87–5.11)
RDW: 16 % — ABNORMAL HIGH (ref 11.5–15.5)
WBC: 5.3 10*3/uL (ref 4.0–10.5)

## 2012-10-01 LAB — FERRITIN: Ferritin: 83 ng/mL (ref 10–291)

## 2012-10-01 LAB — BASIC METABOLIC PANEL
CO2: 28 mEq/L (ref 19–32)
Chloride: 103 mEq/L (ref 96–112)
Sodium: 142 mEq/L (ref 135–145)

## 2012-10-04 ENCOUNTER — Encounter: Payer: Self-pay | Admitting: Cardiology

## 2012-10-05 ENCOUNTER — Encounter: Payer: Self-pay | Admitting: *Deleted

## 2012-10-05 ENCOUNTER — Other Ambulatory Visit: Payer: Self-pay | Admitting: *Deleted

## 2012-10-05 MED ORDER — METOPROLOL TARTRATE 25 MG PO TABS: 25.0000 mg | ORAL_TABLET | Freq: Two times a day (BID) | ORAL | Status: DC

## 2013-05-18 ENCOUNTER — Ambulatory Visit (INDEPENDENT_AMBULATORY_CARE_PROVIDER_SITE_OTHER): Payer: Medicare Other | Admitting: Cardiovascular Disease

## 2013-05-18 ENCOUNTER — Encounter: Payer: Self-pay | Admitting: Cardiovascular Disease

## 2013-05-18 VITALS — BP 149/72 | HR 52 | Ht 66.0 in | Wt 205.0 lb

## 2013-05-18 DIAGNOSIS — I10 Essential (primary) hypertension: Secondary | ICD-10-CM

## 2013-05-18 DIAGNOSIS — E785 Hyperlipidemia, unspecified: Secondary | ICD-10-CM

## 2013-05-18 DIAGNOSIS — I679 Cerebrovascular disease, unspecified: Secondary | ICD-10-CM

## 2013-05-18 DIAGNOSIS — I4891 Unspecified atrial fibrillation: Secondary | ICD-10-CM

## 2013-05-18 MED ORDER — METOPROLOL TARTRATE 25 MG PO TABS: 12.5000 mg | ORAL_TABLET | Freq: Two times a day (BID) | ORAL | Status: DC

## 2013-05-18 NOTE — Progress Notes (Signed)
Patient ID: Glenda Dickson, female   DOB: 1927-10-26, 79 y.o.   MRN: [id number]    SUBJECTIVE: Glenda Dickson has a h/o paroxysmal atrial fibrillation, HTN, and hyperlipidemia. According to previous office visits, she had not had any recurrences for a long period of time, and is thus not on chronic anticoagulation. She has a h/o anemia as well. She's been having inner ear problems which have been causing her some dizziness for the past 2-3 weeks. She uses a cane to walk. She's here with her daugher, Glenda Dickson.  She denies chest pain and palpitations. She's been having indigestion which is unusual for her. Her daughter was concerned about this. This episode lasted an hour. She took Ranitidine which helped. Her daughter gave her some mint and a glass of Coke. She occasionally gets short of breath.  She does not exercise much.  She lost her husband in 2006, and they had been married since she was 71. She has a son and a daughter and 4 grandchildren.  BP: 149/72 Pulse: 52    PHYSICAL EXAM General: NAD Neck: No JVD, no thyromegaly or thyroid nodule.  Lungs: Clear to auscultation bilaterally with normal respiratory effort. CV: Nondisplaced PMI.  Heart regular rhythm, bradycardic, normal S1/S2, no S3/S4, soft I/VI ejection systolic murmur at RUSB.  No peripheral edema.  No carotid bruit.  Normal pedal pulses.  Abdomen: Soft, nontender, no hepatosplenomegaly, no distention.  Neurologic: Alert and oriented x 3.  Psych: Normal affect. Extremities: No clubbing or cyanosis.     LABS: Basic Metabolic Panel: No results found for this basename: NA, K, CL, CO2, GLUCOSE, BUN, CREATININE, CALCIUM, MG, PHOS,  in the last 72 hours Liver Function Tests: No results found for this basename: AST, ALT, ALKPHOS, BILITOT, PROT, ALBUMIN,  in the last 72 hours No results found for this basename: LIPASE, AMYLASE,  in the last 72 hours CBC: No results found for this basename: WBC, NEUTROABS, HGB, HCT, MCV, PLT,  in  the last 72 hours Cardiac Enzymes: No results found for this basename: CKTOTAL, CKMB, CKMBINDEX, TROPONINI,  in the last 72 hours BNP: No components found with this basename: POCBNP,  D-Dimer: No results found for this basename: DDIMER,  in the last 72 hours Hemoglobin A1C: No results found for this basename: HGBA1C,  in the last 72 hours Fasting Lipid Panel: No results found for this basename: CHOL, HDL, LDLCALC, TRIG, CHOLHDL, LDLDIRECT,  in the last 72 hours Thyroid Function Tests: No results found for this basename: TSH, T4TOTAL, FREET3, T3FREE, THYROIDAB,  in the last 72 hours Anemia Panel: No results found for this basename: VITAMINB12, FOLATE, FERRITIN, TIBC, IRON, RETICCTPCT,  in the last 72 hours  ECG: sinus rhythm, 60 bpm    ASSESSMENT AND PLAN: 1. Paroxysmal atrial fibrillation: bradycardic today. Will reduce Metoprolol to 12.5 mg bid. 2. HTN: slightly elevated but given her age and most recent guidelines, this is reasonable. She is on HCTZ 12.5 mg bid and Metoprolol 25 mg bid, as well as Amlodipine 10 mg daily and Ramipril 10 mg daily. 3. Hyperlipidemia: will obtain results of her lipid panel, checked by PCP.   Prentice Docker, M.D., F.A.C.C.

## 2013-05-18 NOTE — Patient Instructions (Addendum)
Your physician recommends that you schedule a follow-up appointment in: 1 year You will receive a reminder letter two months in advance reminding you to call and schedule your appointment. If you don't receive this letter, please contact our office.  Your physician has recommended you make the following change in your medication:  1. Decreased Metoprolol to 12.5 mg twice a day.

## 2013-10-03 ENCOUNTER — Emergency Department (HOSPITAL_COMMUNITY)
Admission: EM | Admit: 2013-10-03 | Discharge: 2013-10-04 | Disposition: A | Discharge status: Home / Self Care | Payer: Medicare Other | Attending: Emergency Medicine | Admitting: Emergency Medicine

## 2013-10-03 ENCOUNTER — Encounter (HOSPITAL_COMMUNITY): Payer: Self-pay | Admitting: Emergency Medicine

## 2013-10-03 DIAGNOSIS — Z862 Personal history of diseases of the blood and blood-forming organs and certain disorders involving the immune mechanism: Secondary | ICD-10-CM | POA: Insufficient documentation

## 2013-10-03 DIAGNOSIS — Z8781 Personal history of (healed) traumatic fracture: Secondary | ICD-10-CM | POA: Insufficient documentation

## 2013-10-03 DIAGNOSIS — E119 Type 2 diabetes mellitus without complications: Secondary | ICD-10-CM | POA: Diagnosis not present

## 2013-10-03 DIAGNOSIS — F3289 Other specified depressive episodes: Secondary | ICD-10-CM | POA: Insufficient documentation

## 2013-10-03 DIAGNOSIS — Z79899 Other long term (current) drug therapy: Secondary | ICD-10-CM | POA: Insufficient documentation

## 2013-10-03 DIAGNOSIS — Z7982 Long term (current) use of aspirin: Secondary | ICD-10-CM | POA: Diagnosis not present

## 2013-10-03 DIAGNOSIS — I10 Essential (primary) hypertension: Secondary | ICD-10-CM | POA: Diagnosis not present

## 2013-10-03 DIAGNOSIS — J111 Influenza due to unidentified influenza virus with other respiratory manifestations: Secondary | ICD-10-CM | POA: Insufficient documentation

## 2013-10-03 DIAGNOSIS — F329 Major depressive disorder, single episode, unspecified: Secondary | ICD-10-CM | POA: Diagnosis not present

## 2013-10-03 DIAGNOSIS — R69 Illness, unspecified: Secondary | ICD-10-CM

## 2013-10-03 DIAGNOSIS — R1110 Vomiting, unspecified: Secondary | ICD-10-CM | POA: Diagnosis not present

## 2013-10-03 DIAGNOSIS — E785 Hyperlipidemia, unspecified: Secondary | ICD-10-CM | POA: Diagnosis not present

## 2013-10-03 LAB — GLUCOSE, CAPILLARY: Glucose-Capillary: 170 mg/dL — ABNORMAL HIGH (ref 70–99)

## 2013-10-03 MED ORDER — ONDANSETRON 4 MG PO TBDP: 4.0000 mg | ORAL_TABLET | Freq: Three times a day (TID) | ORAL | Status: DC | PRN

## 2013-10-03 MED ORDER — KETOROLAC TROMETHAMINE 30 MG/ML IJ SOLN
30.0000 mg | Freq: Once | INTRAMUSCULAR | Status: AC
  Administered 2013-10-03: 30 mg via INTRAVENOUS
  Filled 2013-10-03: qty 1

## 2013-10-03 MED ORDER — NAPROXEN 500 MG PO TABS: 500.0000 mg | ORAL_TABLET | Freq: Two times a day (BID) | ORAL | Status: DC

## 2013-10-03 MED ORDER — ACETAMINOPHEN 325 MG PO TABS
650.0000 mg | ORAL_TABLET | Freq: Once | ORAL | Status: AC
  Administered 2013-10-03: 650 mg via ORAL
  Filled 2013-10-03: qty 2

## 2013-10-03 MED ORDER — SODIUM CHLORIDE 0.9 % IV BOLUS (SEPSIS)
500.0000 mL | Freq: Once | INTRAVENOUS | Status: AC
  Administered 2013-10-03: 500 mL via INTRAVENOUS

## 2013-10-03 MED ORDER — OSELTAMIVIR PHOSPHATE 75 MG PO CAPS: 75.0000 mg | ORAL_CAPSULE | Freq: Two times a day (BID) | ORAL | Status: DC

## 2013-10-03 MED ORDER — ONDANSETRON HCL 4 MG/2ML IJ SOLN
4.0000 mg | Freq: Once | INTRAMUSCULAR | Status: AC
  Administered 2013-10-03: 4 mg via INTRAVENOUS
  Filled 2013-10-03: qty 2

## 2013-10-03 NOTE — Discharge Instructions (Signed)
Take the following medications:  #1 Tamiflu one pill twice a day for the next 5 days  #2 Naprosyn 1 pill twice a day as needed for body aches  #3 Zofran 1 pill every 6-8 hours as needed for nausea or vomiting.  You do have a fever, this fever can be treated by alternating Tylenol and either Naprosyn or Motrin. Please read the attached instructions regarding your flu like illness

## 2013-10-03 NOTE — ED Notes (Signed)
Patient complaining of body aches and vomiting starting approximately 2-3 hours ago.

## 2013-10-03 NOTE — ED Provider Notes (Signed)
CSN: [id number]     Arrival date & time 10/03/13  2236 History   First MD Initiated Contact with Patient 10/03/13 2254     Chief Complaint  Patient presents with  . Generalized Body Aches  . Emesis   (Consider location/radiation/quality/duration/timing/severity/associated sxs/prior Treatment) HPI Comments: 80 year old female, presents with a one day complaint of fevers chills and body aches. This was gradual onset, earlier in the day, has been persistent, mild to moderate, now is associated with vomiting that started several hours ago. Her first symptom after the fevers and chills was a nonproductive cough which has been persistent throughout the afternoon as well has diffuse body myalgias. She has had no medication prior to arrival. She did not get a flu shot this year, she does have diabetes and hypertension but does not get frequent infections and has no sick contacts. She denies dysuria, diarrhea, rash, swelling, chest pain, abdominal pain or back pain. She has no headache, no runny or stuffy nose and no sore throat. She describes the cough as nonproductive  The history is provided by the patient and a relative.    Past Medical History  Diagnosis Date  . Hypertension   . Diabetes mellitus type II     no insulin  . Fracture of left hip 2006    intertrochanteric; ORIF  . Hyperlipidemia   . Depression   . Atrial fibrillation   . Anemia, normocytic normochromic 06/26/2012    H&H-11.2/33.7, normal MCV in 06/2012    Past Surgical History  Procedure Laterality Date  . Abdominal hysterectomy    . Intertrochanteric hip fracture surgery      Fixation  . Colonoscopy  2003    Negative screening study.   Family History  Problem Relation Age of Onset  . Heart attack Father   . Hypertension Father   . Hypertension Mother   . Heart failure Mother   . Hypertension Sister     Atrial Fib  . Hypertension Brother     4/6 brothers with htn   History  Substance Use Topics  . Smoking  status: Never Smoker   . Smokeless tobacco: Never Used  . Alcohol Use: No   OB History   Grav Para Term Preterm Abortions TAB SAB Ect Mult Living                 Review of Systems  All other systems reviewed and are negative.    Allergies  Benzalkonium chloride-alcohol and Codeine  Home Medications   Current Outpatient Rx  Name  Route  Sig  Dispense  Refill  . amLODipine (NORVASC) 10 MG tablet   Oral   Take 10 mg by mouth daily.           Marland Kitchen. aspirin 81 MG tablet   Oral   Take 81 mg by mouth daily.           Marland Kitchen. atorvastatin (LIPITOR) 40 MG tablet   Oral   Take 40 mg by mouth daily.           . Calcium Citrate-Vitamin D (CITRACAL + D PO)   Oral   Take by mouth. D3         . chlorthalidone (HYGROTON) 25 MG tablet   Oral   Take 0.5 tablets (12.5 mg total) by mouth daily.   30 tablet   6   . glyBURIDE-metformin (GLUCOVANCE) 5-500 MG per tablet   Oral   Take 1 tablet by mouth 2 (two) times daily with  a meal. Pt. Takes 2 Tablets in the morning and 2 tablets at night         . hydrOXYzine (ATARAX/VISTARIL) 50 MG tablet               . metoprolol tartrate (LOPRESSOR) 25 MG tablet   Oral   Take 0.5 tablets (12.5 mg total) by mouth 2 (two) times daily.   60 tablet   11   . Multiple Vitamin (MULTIVITAMIN) tablet   Oral   Take 1 tablet by mouth daily.         . naproxen (NAPROSYN) 500 MG tablet   Oral   Take 1 tablet (500 mg total) by mouth 2 (two) times daily with a meal.   20 tablet   0   . ondansetron (ZOFRAN ODT) 4 MG disintegrating tablet   Oral   Take 1 tablet (4 mg total) by mouth every 8 (eight) hours as needed for nausea.   10 tablet   0   . ondansetron (ZOFRAN) 4 MG tablet               . oseltamivir (TAMIFLU) 75 MG capsule   Oral   Take 1 capsule (75 mg total) by mouth every 12 (twelve) hours.   10 capsule   0   . PARoxetine (PAXIL) 20 MG tablet   Oral   Take 20 mg by mouth every morning.           . ramipril  (ALTACE) 10 MG tablet   Oral   Take 10 mg by mouth daily.           Marland Kitchen tolterodine (DETROL LA) 4 MG 24 hr capsule   Oral   Take 4 mg by mouth daily.          BP 182/57  Pulse 97  Temp(Src) 100.7 F (38.2 C) (Oral)  Resp 16  Ht 5' 5.5" (1.664 m)  Wt 180 lb (81.647 kg)  BMI 29.49 kg/m2  SpO2 100% Physical Exam  Nursing note and vitals reviewed. Constitutional: She appears well-developed and well-nourished. No distress.  HENT:  Head: Normocephalic and atraumatic.  Mouth/Throat: No oropharyngeal exudate.  Slight erythema in the posterior pharynx, mucous membranes are moist, tympanic membranes are clear bilaterally, nasal passages are clear, no swollen turbinates or discharge  Eyes: Conjunctivae and EOM are normal. Pupils are equal, round, and reactive to light. Right eye exhibits no discharge. Left eye exhibits no discharge. No scleral icterus.  Neck: Normal range of motion. Neck supple. No JVD present. No thyromegaly present.  No lymphadenopathy  Cardiovascular: Normal rate, regular rhythm, normal heart sounds and intact distal pulses.  Exam reveals no gallop and no friction rub.   No murmur heard. Normal heart rate, normal pulses at the radial arteries bilaterally, normal capillary refill and no JVD  Pulmonary/Chest: Effort normal and breath sounds normal. No respiratory distress. She has no wheezes. She has no rales.  Clear lung sounds, speaks in full sentences, no respiratory distress and no increased work of breathing  Abdominal: Soft. Bowel sounds are normal. She exhibits no distension and no mass. There is no tenderness.  Musculoskeletal: Normal range of motion. She exhibits no edema and no tenderness.  Lymphadenopathy:    She has no cervical adenopathy.  Neurological: She is alert. Coordination normal.  Skin: Skin is warm and dry. No rash noted. No erythema.  Psychiatric: She has a normal mood and affect. Her behavior is normal.    ED Course  Procedures (  including  critical care time) Labs Review Labs Reviewed  GLUCOSE, CAPILLARY - Abnormal; Notable for the following:    Glucose-Capillary 170 (*)    All other components within normal limits   Imaging Review No results found.  EKG Interpretation   None       MDM   1. Influenza-like illness    The patient is febrile, she is not hypoxic, coughs occasionally during the exam but has no rales or wheezing. I suspect that with her myalgias, upper respiratory symptoms with a fever she likely has the flu, she has not vomited here, she will be given Zofran, a small IV fluid bolus and will be discharged with Tamiflu prescription. Tylenol for fever. The patient is in agreement, family members at the bedside expressed understanding to the indications for return.  CBG 170, meds given as below, family given Tamiflu Rx should they become symptomatic  Meds given in ED:  Medications  sodium chloride 0.9 % bolus 500 mL (500 mLs Intravenous New Bag/Given 10/03/13 2314)  ondansetron (ZOFRAN) injection 4 mg (4 mg Intravenous Given 10/03/13 2315)  ketorolac (TORADOL) 30 MG/ML injection 30 mg (30 mg Intravenous Given 10/03/13 2315)  acetaminophen (TYLENOL) tablet 650 mg (650 mg Oral Given 10/03/13 2324)    New Prescriptions   NAPROXEN (NAPROSYN) 500 MG TABLET    Take 1 tablet (500 mg total) by mouth 2 (two) times daily with a meal.   ONDANSETRON (ZOFRAN ODT) 4 MG DISINTEGRATING TABLET    Take 1 tablet (4 mg total) by mouth every 8 (eight) hours as needed for nausea.   OSELTAMIVIR (TAMIFLU) 75 MG CAPSULE    Take 1 capsule (75 mg total) by mouth every 12 (twelve) hours.        Vida Roller, MD 10/03/13 [phone]

## 2013-10-06 DIAGNOSIS — E161 Other hypoglycemia: Secondary | ICD-10-CM | POA: Diagnosis not present

## 2013-10-06 DIAGNOSIS — R7301 Impaired fasting glucose: Secondary | ICD-10-CM | POA: Diagnosis not present

## 2014-03-15 DIAGNOSIS — E785 Hyperlipidemia, unspecified: Secondary | ICD-10-CM | POA: Diagnosis not present

## 2014-03-15 DIAGNOSIS — E119 Type 2 diabetes mellitus without complications: Secondary | ICD-10-CM | POA: Diagnosis not present

## 2014-03-15 DIAGNOSIS — Z79899 Other long term (current) drug therapy: Secondary | ICD-10-CM | POA: Diagnosis not present

## 2014-05-28 ENCOUNTER — Encounter (HOSPITAL_COMMUNITY): Payer: Self-pay | Admitting: Emergency Medicine

## 2014-05-28 ENCOUNTER — Emergency Department (HOSPITAL_COMMUNITY): Payer: Medicare Other

## 2014-05-28 ENCOUNTER — Emergency Department (HOSPITAL_COMMUNITY)
Admission: EM | Admit: 2014-05-28 | Discharge: 2014-05-28 | Disposition: A | Discharge status: Home / Self Care | Payer: Medicare Other | Attending: Emergency Medicine | Admitting: Emergency Medicine

## 2014-05-28 DIAGNOSIS — Y9389 Activity, other specified: Secondary | ICD-10-CM | POA: Insufficient documentation

## 2014-05-28 DIAGNOSIS — I10 Essential (primary) hypertension: Secondary | ICD-10-CM | POA: Diagnosis not present

## 2014-05-28 DIAGNOSIS — Y9289 Other specified places as the place of occurrence of the external cause: Secondary | ICD-10-CM | POA: Insufficient documentation

## 2014-05-28 DIAGNOSIS — Z7982 Long term (current) use of aspirin: Secondary | ICD-10-CM | POA: Insufficient documentation

## 2014-05-28 DIAGNOSIS — Z8781 Personal history of (healed) traumatic fracture: Secondary | ICD-10-CM | POA: Insufficient documentation

## 2014-05-28 DIAGNOSIS — E119 Type 2 diabetes mellitus without complications: Secondary | ICD-10-CM | POA: Diagnosis not present

## 2014-05-28 DIAGNOSIS — S298XXA Other specified injuries of thorax, initial encounter: Secondary | ICD-10-CM | POA: Insufficient documentation

## 2014-05-28 DIAGNOSIS — W1809XA Striking against other object with subsequent fall, initial encounter: Secondary | ICD-10-CM | POA: Diagnosis not present

## 2014-05-28 DIAGNOSIS — R0781 Pleurodynia: Secondary | ICD-10-CM

## 2014-05-28 DIAGNOSIS — Z862 Personal history of diseases of the blood and blood-forming organs and certain disorders involving the immune mechanism: Secondary | ICD-10-CM | POA: Diagnosis not present

## 2014-05-28 DIAGNOSIS — E785 Hyperlipidemia, unspecified: Secondary | ICD-10-CM | POA: Diagnosis not present

## 2014-05-28 DIAGNOSIS — Z79899 Other long term (current) drug therapy: Secondary | ICD-10-CM | POA: Diagnosis not present

## 2014-05-28 DIAGNOSIS — R079 Chest pain, unspecified: Secondary | ICD-10-CM | POA: Diagnosis not present

## 2014-05-28 DIAGNOSIS — IMO0002 Reserved for concepts with insufficient information to code with codable children: Secondary | ICD-10-CM | POA: Diagnosis not present

## 2014-05-28 MED ORDER — NYSTATIN 100000 UNIT/GM EX CREA: TOPICAL_CREAM | CUTANEOUS | Status: DC

## 2014-05-28 MED ORDER — TRAMADOL HCL 50 MG PO TABS: 50.0000 mg | ORAL_TABLET | Freq: Four times a day (QID) | ORAL | Status: DC | PRN

## 2014-05-28 MED ORDER — NAPROXEN 500 MG PO TABS: 500.0000 mg | ORAL_TABLET | Freq: Two times a day (BID) | ORAL | Status: DC

## 2014-05-28 NOTE — ED Provider Notes (Signed)
Pt fell last week, no focal injuries, healed well and then today when trying to get off the commode she felt a pop in the L ribs.  On exam has some ttp in the L ribs, normal lung sounds, xrays without frx - mild effusion - pt informed  Incentive spirometry nsaids Home with f/u - pt in agreement.  Medical screening examination/treatment/procedure(s) were conducted as a shared visit with non-physician practitioner(s) and myself.  I personally evaluated the patient during the encounter.  Clinical Impression: pain in left rib      Vida Roller, MD 05/29/14 [phone]

## 2014-05-28 NOTE — ED Provider Notes (Signed)
CSN: [id number]     Arrival date & time 05/28/14  1231 History  This chart was scribed for non-physician practitioner working with Vida Roller, MD, by Roxy Cedar ED Scribe. This patient was seen in room APA19/APA19 and the patient's care was started at 12:55 PM   Chief Complaint  Patient presents with  . Fall   Patient is a 80 y.o. female presenting with fall. The history is provided by the patient. No language interpreter was used.  Fall This is a new problem. The current episode started yesterday. Associated symptoms include shortness of breath. Pertinent negatives include no chest pain and no abdominal pain. The symptoms are aggravated by walking, standing and exertion. The symptoms are relieved by relaxation and rest.   HPI Comments: Glenda Dickson is a 80 y.o. female who presents to the Emergency Department complaining of sharp, stabbing pain in her left ribs directly under her left breast due to a fall that occurred yesterday. Patient initially fell backwards on her patio and landed on her back and hit her head 1 week ago. Patient had another fall while getting into the shower yesterday. Patient was in the process of getting up off the toilet and getting into the shower. She pulled on the hand rails for support to get up when she felt onset of pain to left rib area under her left breast. She states she sat back down on the toilet and was not able to stand up again for an hour. She states she "felt something move as if she had pulled a muscle". She states that she went and sat on a chair afterwards and felt like she "couldn't get back up again". Patient states she feels mild relief of pain when sitting. Patient also complains of associated nausea that began yesterday. Patient denies vomiting, abdominal pain, fevers, chills or cough. Patient states she currently takes Caltrate tablets.  Past Medical History  Diagnosis Date  . Hypertension   . Diabetes mellitus type II     no insulin  .  Fracture of left hip 2006    intertrochanteric; ORIF  . Hyperlipidemia   . Depression   . Atrial fibrillation   . Anemia, normocytic normochromic 06/26/2012    H&H-11.2/33.7, normal MCV in 06/2012    Past Surgical History  Procedure Laterality Date  . Abdominal hysterectomy    . Intertrochanteric hip fracture surgery      Fixation  . Colonoscopy  2003    Negative screening study.   Family History  Problem Relation Age of Onset  . Heart attack Father   . Hypertension Father   . Hypertension Mother   . Heart failure Mother   . Hypertension Sister     Atrial Fib  . Hypertension Brother     4/6 brothers with htn   History  Substance Use Topics  . Smoking status: Never Smoker   . Smokeless tobacco: Never Used  . Alcohol Use: No   OB History   Grav Para Term Preterm Abortions TAB SAB Ect Mult Living                 Review of Systems  Constitutional: Negative for fever and chills.  HENT: Negative for rhinorrhea and sore throat.   Eyes: Negative for visual disturbance.  Respiratory: Positive for shortness of breath. Negative for cough.   Cardiovascular: Negative for chest pain.  Gastrointestinal: Positive for nausea. Negative for vomiting and abdominal pain.  Genitourinary: Negative for dysuria.  Musculoskeletal: Positive for  back pain.       Pain in left flank below breast  Skin: Positive for rash.  Neurological: Negative for dizziness, syncope and numbness.  Psychiatric/Behavioral: Negative for confusion.  All other systems reviewed and are negative.  Allergies  Benzalkonium chloride-alcohol and Codeine  Home Medications   Prior to Admission medications   Medication Sig Start Date End Date Taking? Authorizing Provider  amLODipine (NORVASC) 10 MG tablet Take 10 mg by mouth daily.      Historical Provider, MD  aspirin 81 MG tablet Take 81 mg by mouth daily.      Historical Provider, MD  atorvastatin (LIPITOR) 40 MG tablet Take 40 mg by mouth daily.      Historical  Provider, MD  Calcium Citrate-Vitamin D (CITRACAL + D PO) Take by mouth. D3    Historical Provider, MD  chlorthalidone (HYGROTON) 25 MG tablet Take 0.5 tablets (12.5 mg total) by mouth daily. 07/20/12   Kathlen Brunswick, MD  glyBURIDE-metformin (GLUCOVANCE) 5-500 MG per tablet Take 1 tablet by mouth 2 (two) times daily with a meal. Pt. Takes 2 Tablets in the morning and 2 tablets at night    Historical Provider, MD  hydrOXYzine (ATARAX/VISTARIL) 50 MG tablet  04/15/13   Historical Provider, MD  Multiple Vitamin (MULTIVITAMIN) tablet Take 1 tablet by mouth daily.    Historical Provider, MD  naproxen (NAPROSYN) 500 MG tablet Take 1 tablet (500 mg total) by mouth 2 (two) times daily with a meal. 10/03/13   Vida Roller, MD  ondansetron (ZOFRAN ODT) 4 MG disintegrating tablet Take 1 tablet (4 mg total) by mouth every 8 (eight) hours as needed for nausea. 10/03/13   Vida Roller, MD  ondansetron [facility]) 4 MG tablet  04/15/13   Historical Provider, MD  oseltamivir (TAMIFLU) 75 MG capsule Take 1 capsule (75 mg total) by mouth every 12 (twelve) hours. 10/03/13   Vida Roller, MD  PARoxetine (PAXIL) 20 MG tablet Take 20 mg by mouth every morning.      Historical Provider, MD  ramipril (ALTACE) 10 MG tablet Take 10 mg by mouth daily.      Historical Provider, MD  tolterodine (DETROL LA) 4 MG 24 hr capsule Take 4 mg by mouth daily.    Historical Provider, MD   Triage Vitals: BP 164/64  Pulse 70  Temp(Src) 99.3 F (37.4 C) (Oral)  Resp 20  Ht  (1.676 m)  Wt 180 lb (81.647 kg)  BMI 29.07 kg/m2  SpO2 100%  Physical Exam  Nursing note and vitals reviewed. Constitutional: She appears well-developed and well-nourished. No distress.  HENT:  Head: Normocephalic and atraumatic.  Mouth/Throat: Oropharynx is clear and moist. No oropharyngeal exudate.  Eyes: Conjunctivae and EOM are normal. Pupils are equal, round, and reactive to light. Right eye exhibits no discharge. Left eye exhibits no discharge.  No scleral icterus.  Neck: Normal range of motion. Neck supple. No JVD present. No thyromegaly present.  Cardiovascular: Normal rate, regular rhythm, S1 normal, S2 normal, normal heart sounds and intact distal pulses.  Exam reveals no gallop and no friction rub.   No murmur heard. Pulmonary/Chest: Effort normal and breath sounds normal. No respiratory distress. She has no decreased breath sounds. She has no wheezes. She has no rales.    Abdominal: Soft. Bowel sounds are normal. She exhibits no distension and no mass. There is no tenderness.  Musculoskeletal: Normal range of motion. She exhibits no edema and no tenderness.  Cervical back: Normal. She exhibits no bony tenderness.       Thoracic back: Normal. She exhibits no bony tenderness.       Lumbar back: Normal. She exhibits no bony tenderness.  No bondy tenderness in cervical pine, thoracic spine, or lumbar spine.  Lymphadenopathy:    She has no cervical adenopathy.  Neurological: She is alert. Coordination normal.  Skin: Skin is warm and dry. Rash noted. There is erythema.  0.5 cm brown-colored mole on upper back with quarter of upper border is darker than the rest of it.  Excoriated reddened skin under left breast.  Psychiatric: She has a normal mood and affect. Her behavior is normal.   ED Course  Procedures (including critical care time)  DIAGNOSTIC STUDIES: Oxygen Saturation is 100% on RA, normal by my interpretation.    COORDINATION OF CARE: 12:53 PM- Discussed plans to order diagnostic CXR. Will give topical cream for yeast infection under left breast. Pt advised of plan for treatment and pt agrees.  Labs Review Labs Reviewed - No data to display  Imaging Review Dg Ribs Unilateral W/chest Left  05/28/2014   CLINICAL DATA:  Left rib pain.  Fall 6 days ago.  EXAM: LEFT RIBS AND CHEST - 3+ VIEW  COMPARISON:  05/28/2005 chest radiograph  FINDINGS: Trace blunting of the left costophrenic angle. No pleural effusion or  pulmonary contusion. Atherosclerotic aortic arch.  Mildly enlarged cardiopericardial silhouette.  The BB marker is over the patient' s left anterior costochondral junction. No well-defined rib fracture is radiographically apparent.  IMPRESSION: 1. Trace left pleural effusion could be a secondary sign of occult rib fracture, but I do not observe obvious rib discontinuity. 2. Mild cardiomegaly.   Electronically Signed   By: Herbie Baltimore M.D.   On: 05/28/2014 13:45     EKG Interpretation None      MDM   Final diagnoses:  Rib pain on left side   TTP over left anterior and lateral ribs.  Xray of left ribs, does not show obvious fracture, but some trace pleural effusion.  Pt given incentive spirometer with instructions and return demonstration in the ED.  Pt appears safe to go home.  Discharge instructions include symptomatic management of pain, incentive spirometer use and follow-up with primary care provider.  Return precautions provided.  Pt and family agreeable with plan.      Filed Vitals:   05/28/14 1241  BP: 164/64  Pulse: 70  Temp: 99.3 F (37.4 C)  TempSrc: Oral  Resp: 20  Height:  (1.676 m)  Weight: 180 lb (81.647 kg)  SpO2: 100%   Meds given in ED:  Medications - No data to display  New Prescriptions   NAPROXEN (NAPROSYN) 500 MG TABLET    Take 1 tablet (500 mg total) by mouth 2 (two) times daily with a meal.   NYSTATIN CREAM (MYCOSTATIN)    Apply to affected area 2 times daily   TRAMADOL (ULTRAM) 50 MG TABLET    Take 1 tablet (50 mg total) by mouth every 6 (six) hours as needed.     I personally performed the services described in this documentation, which was scribed in my presence. The recorded information has been reviewed and is accurate.       Harle Battiest, NP 05/28/14 1501

## 2014-05-28 NOTE — ED Notes (Signed)
Patient with no complaints at this time. Respirations even and unlabored. Skin warm/dry. Discharge instructions reviewed with patient at this time. Patient given opportunity to voice concerns/ask questions. Patient discharged at this time and left Emergency Department with steady gait.   

## 2014-05-28 NOTE — ED Notes (Signed)
PT fell backwards on a wooden deck and hit the back of her head and upper back. PT c/o left rib pain today. PT denies any SOB.

## 2014-05-28 NOTE — Discharge Instructions (Signed)
xrays without fracture - small amount of fluid in your chest - have your doctor recheck you in one week  Use the breathing machine every 30 minutes  Please call your doctor for a followup appointment within 24-48 hours. When you talk to your doctor please let them know that you were seen in the emergency department and have them acquire all of your records so that they can discuss the findings with you and formulate a treatment plan to fully care for your new and ongoing problems.

## 2014-05-29 NOTE — ED Provider Notes (Signed)
Medical screening examination/treatment/procedure(s) were conducted as a shared visit with non-physician practitioner(s) and myself.  I personally evaluated the patient during the encounter  Please see my separate respective documentation pertaining to this patient encounter   Vida Roller, MD 05/29/14 [phone]

## 2014-10-04 DIAGNOSIS — E785 Hyperlipidemia, unspecified: Secondary | ICD-10-CM | POA: Diagnosis not present

## 2014-10-04 DIAGNOSIS — Z79899 Other long term (current) drug therapy: Secondary | ICD-10-CM | POA: Diagnosis not present

## 2014-10-04 DIAGNOSIS — E1121 Type 2 diabetes mellitus with diabetic nephropathy: Secondary | ICD-10-CM | POA: Diagnosis not present

## 2014-11-23 DIAGNOSIS — E1121 Type 2 diabetes mellitus with diabetic nephropathy: Secondary | ICD-10-CM | POA: Diagnosis not present

## 2014-11-23 DIAGNOSIS — E119 Type 2 diabetes mellitus without complications: Secondary | ICD-10-CM | POA: Diagnosis not present

## 2014-11-23 DIAGNOSIS — Z79899 Other long term (current) drug therapy: Secondary | ICD-10-CM | POA: Diagnosis not present

## 2015-07-18 DIAGNOSIS — E785 Hyperlipidemia, unspecified: Secondary | ICD-10-CM | POA: Diagnosis not present

## 2015-07-18 DIAGNOSIS — Z79899 Other long term (current) drug therapy: Secondary | ICD-10-CM | POA: Diagnosis not present

## 2015-07-18 DIAGNOSIS — E119 Type 2 diabetes mellitus without complications: Secondary | ICD-10-CM | POA: Diagnosis not present

## 2015-09-13 DIAGNOSIS — E119 Type 2 diabetes mellitus without complications: Secondary | ICD-10-CM | POA: Diagnosis not present

## 2015-09-13 DIAGNOSIS — H43813 Vitreous degeneration, bilateral: Secondary | ICD-10-CM | POA: Diagnosis not present

## 2015-10-23 ENCOUNTER — Other Ambulatory Visit (HOSPITAL_COMMUNITY): Payer: Self-pay | Admitting: Internal Medicine

## 2015-10-23 ENCOUNTER — Ambulatory Visit (HOSPITAL_COMMUNITY)
Admission: RE | Admit: 2015-10-23 | Discharge: 2015-10-23 | Disposition: A | Discharge status: Home / Self Care | Payer: Medicare Other | Source: Ambulatory Visit | Attending: Internal Medicine | Admitting: Internal Medicine

## 2015-10-23 DIAGNOSIS — I10 Essential (primary) hypertension: Secondary | ICD-10-CM | POA: Diagnosis not present

## 2015-10-23 DIAGNOSIS — R0600 Dyspnea, unspecified: Secondary | ICD-10-CM | POA: Diagnosis not present

## 2015-10-23 DIAGNOSIS — E782 Mixed hyperlipidemia: Secondary | ICD-10-CM | POA: Diagnosis not present

## 2015-10-23 DIAGNOSIS — R0602 Shortness of breath: Secondary | ICD-10-CM | POA: Diagnosis not present

## 2015-10-23 DIAGNOSIS — E119 Type 2 diabetes mellitus without complications: Secondary | ICD-10-CM | POA: Diagnosis not present

## 2015-10-23 LAB — COMPREHENSIVE METABOLIC PANEL
ALT: 13 U/L — ABNORMAL LOW (ref 14–54)
ANION GAP: 11 (ref 5–15)
AST: 18 U/L (ref 15–41)
Albumin: 3.8 g/dL (ref 3.5–5.0)
Alkaline Phosphatase: 85 U/L (ref 38–126)
BUN: 42 mg/dL — ABNORMAL HIGH (ref 6–20)
CALCIUM: 9.2 mg/dL (ref 8.9–10.3)
CHLORIDE: 109 mmol/L (ref 101–111)
CO2: 23 mmol/L (ref 22–32)
Creatinine, Ser: 1.79 mg/dL — ABNORMAL HIGH (ref 0.44–1.00)
GFR, EST AFRICAN AMERICAN: 29 mL/min — AB (ref >60)
GFR, EST NON AFRICAN AMERICAN: 25 mL/min — AB (ref >60)
Glucose, Bld: 144 mg/dL — ABNORMAL HIGH (ref 65–99)
Potassium: 4.4 mmol/L (ref 3.5–5.1)
Sodium: 143 mmol/L (ref 135–145)
Total Bilirubin: 1.9 mg/dL — ABNORMAL HIGH (ref 0.3–1.2)
Total Protein: 6.5 g/dL (ref 6.5–8.1)

## 2015-10-23 LAB — LIPID PANEL
CHOLESTEROL: 101 mg/dL (ref 0–200)
HDL: 30 mg/dL — AB (ref >40)
LDL CALC: 52 mg/dL (ref 0–99)
TRIGLYCERIDES: 96 mg/dL (ref <150)
Total CHOL/HDL Ratio: 3.4 RATIO
VLDL: 19 mg/dL (ref 0–40)

## 2015-10-23 LAB — CBC WITH DIFFERENTIAL/PLATELET
BASOS PCT: 0 %
Basophils Absolute: 0 10*3/uL (ref 0.0–0.1)
Eosinophils Absolute: 0.1 10*3/uL (ref 0.0–0.7)
Eosinophils Relative: 1 %
HCT: 31.8 % — ABNORMAL LOW (ref 36.0–46.0)
Hemoglobin: 10.2 g/dL — ABNORMAL LOW (ref 12.0–15.0)
LYMPHS PCT: 17 %
Lymphs Abs: 1 10*3/uL (ref 0.7–4.0)
MCH: 29.8 pg (ref 26.0–34.0)
MCHC: 32.1 g/dL (ref 30.0–36.0)
MCV: 93 fL (ref 78.0–100.0)
MONO ABS: 0.4 10*3/uL (ref 0.1–1.0)
MONOS PCT: 6 %
Neutro Abs: 4.6 10*3/uL (ref 1.7–7.7)
Neutrophils Relative %: 76 %
Platelets: 148 10*3/uL — ABNORMAL LOW (ref 150–400)
RBC: 3.42 MIL/uL — ABNORMAL LOW (ref 3.87–5.11)
RDW: 16.2 % — ABNORMAL HIGH (ref 11.5–15.5)
WBC: 6 10*3/uL (ref 4.0–10.5)

## 2015-10-24 ENCOUNTER — Other Ambulatory Visit (HOSPITAL_COMMUNITY)
Admission: RE | Admit: 2015-10-24 | Discharge: 2015-10-24 | Disposition: A | Discharge status: Home / Self Care | Payer: Medicare Other | Source: Ambulatory Visit | Attending: Internal Medicine | Admitting: Internal Medicine

## 2015-10-26 DIAGNOSIS — R0600 Dyspnea, unspecified: Secondary | ICD-10-CM | POA: Diagnosis not present

## 2015-10-26 DIAGNOSIS — I480 Paroxysmal atrial fibrillation: Secondary | ICD-10-CM | POA: Diagnosis not present

## 2015-10-30 ENCOUNTER — Ambulatory Visit (INDEPENDENT_AMBULATORY_CARE_PROVIDER_SITE_OTHER): Payer: Medicare Other | Admitting: Cardiovascular Disease

## 2015-10-30 VITALS — BP 122/84 | HR 79 | Ht 65.0 in | Wt 215.0 lb

## 2015-10-30 DIAGNOSIS — E785 Hyperlipidemia, unspecified: Secondary | ICD-10-CM

## 2015-10-30 DIAGNOSIS — R0609 Other forms of dyspnea: Secondary | ICD-10-CM | POA: Diagnosis not present

## 2015-10-30 DIAGNOSIS — R600 Localized edema: Secondary | ICD-10-CM

## 2015-10-30 DIAGNOSIS — I10 Essential (primary) hypertension: Secondary | ICD-10-CM

## 2015-10-30 DIAGNOSIS — I4891 Unspecified atrial fibrillation: Secondary | ICD-10-CM | POA: Diagnosis not present

## 2015-10-30 DIAGNOSIS — Z7189 Other specified counseling: Secondary | ICD-10-CM

## 2015-10-30 MED ORDER — METOPROLOL TARTRATE 25 MG PO TABS: 25.0000 mg | ORAL_TABLET | Freq: Two times a day (BID) | ORAL | Status: DC

## 2015-10-30 MED ORDER — WARFARIN SODIUM 2.5 MG PO TABS: 2.5000 mg | ORAL_TABLET | Freq: Every day | ORAL | Status: DC

## 2015-10-30 MED ORDER — FUROSEMIDE 20 MG PO TABS: 20.0000 mg | ORAL_TABLET | Freq: Every day | ORAL | Status: DC

## 2015-10-30 NOTE — Patient Instructions (Addendum)
Your physician recommends that you schedule a follow-up appointment in: 3 weeks with Dr Purvis Sheffield    STOP Aspirin   STOP Coreg   START Lopressor 25 mg twice a day   START Warfarin (Coumadin) 2.5 mg at dinner.See Vashti Hey RN,Coumadin Nurse on Monday   START Lasix 20 mg daily  GET labs on Thursday 11/02/15   Your physician has requested that you have an echocardiogram. Echocardiography is a painless test that uses sound waves to create images of your heart. It provides your doctor with information about the size and shape of your heart and how well your heart's chambers and valves are working. This procedure takes approximately one hour. There are no restrictions for this procedure.       Thank you for choosing [facility] !

## 2015-10-30 NOTE — Progress Notes (Signed)
Patient ID: Glenda Dickson, female   DOB: 1927-11-18, 82 y.o.   MRN: [id number]      SUBJECTIVE: Glenda Dickson is an 82 yr old woman who presents for the evaluation of dyspnea. I have not seen her since 04/2013. She has a history of paroxysmal atrial fibrillation, hypertension, and hyperlipidemia. According to previous office visits, she had not had any recurrences for a long period of time, and was not placed on chronic anticoagulation. She has a history of anemia as well.  Echocardiogram on 06/23/12 showed normal LV systolic function, EF 60-65%, mild LVH, and grade 1 diastolic dysfunction.  She has been more short of breath with minimal exertion since Christmas. Describes significant dyspnea when walking from bedroom to bathroom. No exertional chest tightness. Has had progressive abdominal distention and leg swelling as well. Denies syncope. Denies hematochezia, melena, and hematuria.  ECG today shows rapid atrial fibrillation, HR 106 bpm, with a nonspecific T wave abnormality.  Chest xray on 10/13/15 showed atelectasis with "tiny bilateral effusions".  1/20 labs: BUN 42, creatinine 1.79, Hgb 10.2, LDL 52, TC 101.   Here with daughter and son in law.   Review of Systems: As per "subjective", otherwise negative.  Allergies  Allergen Reactions  . Benzalkonium Chloride-Alcohol   . Codeine     Current Outpatient Prescriptions  Medication Sig Dispense Refill  . amLODipine (NORVASC) 10 MG tablet Take 10 mg by mouth daily.    Marland Kitchen atorvastatin (LIPITOR) 40 MG tablet Take 40 mg by mouth daily.      . Calcium Citrate-Vitamin D (CITRACAL + D PO) Take 1 tablet by mouth daily. D3    . carvedilol (COREG) 3.125 MG tablet TK 1 T PO BID FOR HYPERTENSION  0  . GLIPIZIDE XL 5 MG 24 hr tablet TK 1 T PO  QPM WITH EVENING MEAL  2  . Multiple Vitamin (MULTIVITAMIN) tablet Take 1 tablet by mouth daily.    Marland Kitchen PARoxetine (PAXIL) 20 MG tablet Take 20 mg by mouth every morning.      . ramipril (ALTACE) 5 MG  capsule Take 5 mg by mouth daily.    Marland Kitchen aspirin EC 81 MG tablet Take 81 mg by mouth daily.    Marland Kitchen FLOVENT HFA 110 MCG/ACT inhaler INHALE 1 PUFF PO BID  0  . levofloxacin (LEVAQUIN) 500 MG tablet TK 1 T PO D  0   No current facility-administered medications for this visit.    Past Medical History  Diagnosis Date  . Hypertension   . Diabetes mellitus type II     no insulin  . Fracture of left hip 2006    intertrochanteric; ORIF  . Hyperlipidemia   . Depression   . Atrial fibrillation   . Anemia, normocytic normochromic 06/26/2012    H&H-11.2/33.7, normal MCV in 06/2012     Past Surgical History  Procedure Laterality Date  . Abdominal hysterectomy    . Intertrochanteric hip fracture surgery      Fixation  . Colonoscopy  2003    Negative screening study.    Social History   Social History  . Marital Status: Widowed    Spouse Name: N/A  . Number of Children: N/A  . Years of Education: N/A   Occupational History  . Not on file.   Social History Main Topics  . Smoking status: Never Smoker   . Smokeless tobacco: Never Used  . Alcohol Use: No  . Drug Use: No  . Sexual Activity: Not on file  Other Topics Concern  . Not on file   Social History Narrative  . No narrative on file     Filed Vitals:   10/30/15 1140  BP: 122/84  Pulse: 79  Height:  (1.651 m)  Weight: 215 lb (97.523 kg)  SpO2: 95%    PHYSICAL EXAM General: NAD HEENT: Normal. Neck: No JVD, no thyromegaly. Lungs: Clear to auscultation bilaterally with normal respiratory effort. CV: Tachycardic, irregular rhythm, normal S1/S2, no S3, no murmur. 1+ pitting pretibial edema bilaterally.   Abdomen: Soft, nontender.  Neurologic: Alert and oriented.  Psych: Normal affect. Skin: Normal. Musculoskeletal: No gross deformities.  ECG: Most recent ECG reviewed.      ASSESSMENT AND PLAN: 1. Exertional dyspnea with rapid atrial fibrillation: Will switch Coreg to metoprolol tartrate 25 mg bid for  more optimal HR control. CHA2DS2Vasc score 6, thus high thromboembolic risk. Given CKD, will start warfarin and enroll in anticoagulation clinic for INR monitoring. Informed about monitoring for development of bleeding problems. I will order a 2-D echocardiogram with Doppler to evaluate cardiac structure, function, and regional wall motion.  2. Bilateral leg edema: Likely related to rapid atrial fibrillation and diastolic heart failure. Will start Lasix 20 mg daily. I will order a 2-D echocardiogram with Doppler to evaluate cardiac structure, function, and regional wall motion. Will check BMET 2/9.  3. Essential HTN: Controlled. Med changes as noted above.  4. Hyperlipidemia: Lipids reviewed above. Contineu Lipitor 40 mg.  Dispo: f/u 3 weeks.   Time spent: 40 minutes, of which greater than 50% was spent reviewing symptoms, relevant blood tests and studies, and discussing management plan with the patient.   Prentice Docker, M.D., F.A.C.C.

## 2015-11-01 DIAGNOSIS — D509 Iron deficiency anemia, unspecified: Secondary | ICD-10-CM | POA: Diagnosis not present

## 2015-11-01 DIAGNOSIS — I10 Essential (primary) hypertension: Secondary | ICD-10-CM | POA: Diagnosis not present

## 2015-11-01 DIAGNOSIS — D649 Anemia, unspecified: Secondary | ICD-10-CM | POA: Diagnosis not present

## 2015-11-01 DIAGNOSIS — I480 Paroxysmal atrial fibrillation: Secondary | ICD-10-CM | POA: Diagnosis not present

## 2015-11-01 DIAGNOSIS — E785 Hyperlipidemia, unspecified: Secondary | ICD-10-CM | POA: Diagnosis not present

## 2015-11-01 DIAGNOSIS — E119 Type 2 diabetes mellitus without complications: Secondary | ICD-10-CM | POA: Diagnosis not present

## 2015-11-01 DIAGNOSIS — E782 Mixed hyperlipidemia: Secondary | ICD-10-CM | POA: Diagnosis not present

## 2015-11-02 ENCOUNTER — Ambulatory Visit (HOSPITAL_COMMUNITY): Payer: Medicare Other

## 2015-11-02 ENCOUNTER — Other Ambulatory Visit (HOSPITAL_COMMUNITY): Payer: PRIVATE HEALTH INSURANCE

## 2015-11-06 ENCOUNTER — Ambulatory Visit (INDEPENDENT_AMBULATORY_CARE_PROVIDER_SITE_OTHER): Payer: Medicare Other | Admitting: *Deleted

## 2015-11-06 ENCOUNTER — Ambulatory Visit (HOSPITAL_COMMUNITY)
Admission: RE | Admit: 2015-11-06 | Discharge: 2015-11-06 | Disposition: A | Discharge status: Home / Self Care | Payer: Medicare Other | Source: Ambulatory Visit | Attending: Cardiovascular Disease | Admitting: Cardiovascular Disease

## 2015-11-06 DIAGNOSIS — I5189 Other ill-defined heart diseases: Secondary | ICD-10-CM | POA: Insufficient documentation

## 2015-11-06 DIAGNOSIS — I358 Other nonrheumatic aortic valve disorders: Secondary | ICD-10-CM | POA: Diagnosis not present

## 2015-11-06 DIAGNOSIS — Z5181 Encounter for therapeutic drug level monitoring: Secondary | ICD-10-CM | POA: Diagnosis not present

## 2015-11-06 DIAGNOSIS — I4891 Unspecified atrial fibrillation: Secondary | ICD-10-CM | POA: Diagnosis not present

## 2015-11-06 DIAGNOSIS — I10 Essential (primary) hypertension: Secondary | ICD-10-CM | POA: Diagnosis not present

## 2015-11-06 DIAGNOSIS — E785 Hyperlipidemia, unspecified: Secondary | ICD-10-CM | POA: Insufficient documentation

## 2015-11-06 DIAGNOSIS — I071 Rheumatic tricuspid insufficiency: Secondary | ICD-10-CM | POA: Insufficient documentation

## 2015-11-06 DIAGNOSIS — I517 Cardiomegaly: Secondary | ICD-10-CM | POA: Diagnosis not present

## 2015-11-06 DIAGNOSIS — E119 Type 2 diabetes mellitus without complications: Secondary | ICD-10-CM | POA: Insufficient documentation

## 2015-11-06 DIAGNOSIS — I340 Nonrheumatic mitral (valve) insufficiency: Secondary | ICD-10-CM | POA: Insufficient documentation

## 2015-11-06 DIAGNOSIS — Z7189 Other specified counseling: Secondary | ICD-10-CM | POA: Insufficient documentation

## 2015-11-06 DIAGNOSIS — I313 Pericardial effusion (noninflammatory): Secondary | ICD-10-CM | POA: Insufficient documentation

## 2015-11-06 DIAGNOSIS — I480 Paroxysmal atrial fibrillation: Secondary | ICD-10-CM

## 2015-11-06 LAB — POCT INR: INR: 1.7

## 2015-11-13 ENCOUNTER — Ambulatory Visit (INDEPENDENT_AMBULATORY_CARE_PROVIDER_SITE_OTHER): Payer: Medicare Other | Admitting: *Deleted

## 2015-11-13 DIAGNOSIS — Z5181 Encounter for therapeutic drug level monitoring: Secondary | ICD-10-CM

## 2015-11-13 DIAGNOSIS — I480 Paroxysmal atrial fibrillation: Secondary | ICD-10-CM

## 2015-11-13 DIAGNOSIS — I4891 Unspecified atrial fibrillation: Secondary | ICD-10-CM | POA: Diagnosis not present

## 2015-11-13 LAB — POCT INR: INR: 1.9

## 2015-11-14 DIAGNOSIS — R062 Wheezing: Secondary | ICD-10-CM | POA: Diagnosis not present

## 2015-11-14 DIAGNOSIS — R0602 Shortness of breath: Secondary | ICD-10-CM | POA: Diagnosis not present

## 2015-11-22 ENCOUNTER — Ambulatory Visit (INDEPENDENT_AMBULATORY_CARE_PROVIDER_SITE_OTHER): Payer: Medicare Other | Admitting: *Deleted

## 2015-11-22 DIAGNOSIS — I4891 Unspecified atrial fibrillation: Secondary | ICD-10-CM | POA: Diagnosis not present

## 2015-11-22 DIAGNOSIS — Z5181 Encounter for therapeutic drug level monitoring: Secondary | ICD-10-CM

## 2015-11-22 DIAGNOSIS — I480 Paroxysmal atrial fibrillation: Secondary | ICD-10-CM

## 2015-11-22 LAB — POCT INR: INR: 3.2

## 2015-11-22 MED ORDER — RAMIPRIL 5 MG PO CAPS: 5.0000 mg | ORAL_CAPSULE | Freq: Every day | ORAL | Status: DC

## 2015-11-27 ENCOUNTER — Ambulatory Visit (INDEPENDENT_AMBULATORY_CARE_PROVIDER_SITE_OTHER): Payer: Medicare Other | Admitting: Cardiovascular Disease

## 2015-11-27 ENCOUNTER — Encounter: Payer: Self-pay | Admitting: Cardiovascular Disease

## 2015-11-27 VITALS — BP 122/68 | HR 110 | Ht 65.0 in | Wt 203.0 lb

## 2015-11-27 DIAGNOSIS — R600 Localized edema: Secondary | ICD-10-CM | POA: Diagnosis not present

## 2015-11-27 DIAGNOSIS — R0609 Other forms of dyspnea: Secondary | ICD-10-CM

## 2015-11-27 DIAGNOSIS — I10 Essential (primary) hypertension: Secondary | ICD-10-CM | POA: Diagnosis not present

## 2015-11-27 DIAGNOSIS — E785 Hyperlipidemia, unspecified: Secondary | ICD-10-CM

## 2015-11-27 DIAGNOSIS — I4891 Unspecified atrial fibrillation: Secondary | ICD-10-CM

## 2015-11-27 MED ORDER — METOPROLOL TARTRATE 50 MG PO TABS: 50.0000 mg | ORAL_TABLET | Freq: Two times a day (BID) | ORAL | Status: DC

## 2015-11-27 NOTE — Patient Instructions (Signed)
Your physician recommends that you schedule a follow-up appointment in: 3 months with Dr Purvis SheffieldKoneswaran    INCREASE Metoprolol to 50 mg twice a day    If you need a refill on your cardiac medications before your next appointment, please call your pharmacy.     Thank you for choosing [facility] !

## 2015-11-27 NOTE — Progress Notes (Signed)
Patient ID: Glenda Dickson, female   DOB: 07-20-1928, 82 y.o.   MRN: [id number]      SUBJECTIVE: The patient presents for follow up of atrial fibrillation. Echocardiogram on 11/06/15 demonstrated normal left ventricular systolic function, LVEF 50-55%, mild left atrial enlargement , and mild to moderate tricuspid regurgitation.  She is dealing with an upper respiratory infection and is going to see her PCP after seeing me. She feels better with respect to her dyspnea than when she first saw me.   Review of Systems: As per "subjective", otherwise negative.  Allergies  Allergen Reactions  . Benzalkonium Chloride-Alcohol   . Codeine     Current Outpatient Prescriptions  Medication Sig Dispense Refill  . amLODipine (NORVASC) 10 MG tablet Take 10 mg by mouth daily.    Marland Kitchen atorvastatin (LIPITOR) 40 MG tablet Take 40 mg by mouth daily.      . Calcium Citrate-Vitamin D (CITRACAL + D PO) Take 1 tablet by mouth daily. D3    . FLOVENT HFA 110 MCG/ACT inhaler INHALE 1 PUFF PO BID  0  . furosemide (LASIX) 20 MG tablet Take 1 tablet (20 mg total) by mouth daily. 90 tablet 3  . GLIPIZIDE XL 5 MG 24 hr tablet TK 1 T PO  QPM WITH EVENING MEAL  2  . metoprolol tartrate (LOPRESSOR) 25 MG tablet Take 1 tablet (25 mg total) by mouth 2 (two) times daily. 180 tablet 3  . Multiple Vitamin (MULTIVITAMIN) tablet Take 1 tablet by mouth daily.    Marland Kitchen PARoxetine (PAXIL) 20 MG tablet Take 20 mg by mouth every morning.      . ramipril (ALTACE) 5 MG capsule Take 1 capsule (5 mg total) by mouth daily. 30 capsule 3  . warfarin (COUMADIN) 2.5 MG tablet Take 1 tablet (2.5 mg total) by mouth daily. 60 tablet 3   No current facility-administered medications for this visit.    Past Medical History  Diagnosis Date  . Hypertension   . Diabetes mellitus type II     no insulin  . Fracture of left hip (HCC) 2006    intertrochanteric; ORIF  . Hyperlipidemia   . Depression   . Atrial fibrillation (HCC)   . Anemia,  normocytic normochromic 06/26/2012    H&H-11.2/33.7, normal MCV in 06/2012     Past Surgical History  Procedure Laterality Date  . Abdominal hysterectomy    . Intertrochanteric hip fracture surgery      Fixation  . Colonoscopy  2003    Negative screening study.    Social History   Social History  . Marital Status: Widowed    Spouse Name: N/A  . Number of Children: N/A  . Years of Education: N/A   Occupational History  . Not on file.   Social History Main Topics  . Smoking status: Never Smoker   . Smokeless tobacco: Never Used  . Alcohol Use: No  . Drug Use: No  . Sexual Activity: Not on file   Other Topics Concern  . Not on file   Social History Narrative     Filed Vitals:   11/27/15 1341  BP: 122/68  Pulse: 110  Height:  (1.651 m)  Weight: 203 lb (92.08 kg)  SpO2: 95%    PHYSICAL EXAM General: NAD HEENT: Normal. Neck: No JVD, no thyromegaly. Lungs: Clear to auscultation bilaterally with normal respiratory effort. CV: Tachycardic, irregular rhythm, normal S1/S2, no S3, no murmur. Trivial pretibial edema bilaterally.  Abdomen: Soft, nontender.  Neurologic: Alert  and oriented.  Psych: Normal affect. Skin: Normal. Musculoskeletal: No gross deformities.  ECG: Most recent ECG reviewed.      ASSESSMENT AND PLAN: 1. Exertional dyspnea with rapid atrial fibrillation: Symptomatically improved with suboptimal HR control. Will increase metoprolol tartrate to 50 mg bid. CHA2DS2Vasc score 6, thus high thromboembolic risk. Given CKD, will continue warfarin. 2-D echocardiogram reviewed above.  2. Chronic diastolic heart failure/bilateral leg edema: Euvolemic and likely related to rapid atrial fibrillation. Will continue Lasix 20 mg daily.   3. Essential HTN: Controlled. Monitor given increase in metoprolol dose.  4. Hyperlipidemia: Lipids previously reviewed. Continue Lipitor 40 mg.  Dispo: f/u 3 months.  Prentice DockerSuresh Koneswaran, M.D., F.A.C.C.

## 2015-12-04 DIAGNOSIS — E46 Unspecified protein-calorie malnutrition: Secondary | ICD-10-CM | POA: Diagnosis not present

## 2015-12-04 DIAGNOSIS — E119 Type 2 diabetes mellitus without complications: Secondary | ICD-10-CM | POA: Diagnosis not present

## 2015-12-04 DIAGNOSIS — I10 Essential (primary) hypertension: Secondary | ICD-10-CM | POA: Diagnosis not present

## 2015-12-04 DIAGNOSIS — D509 Iron deficiency anemia, unspecified: Secondary | ICD-10-CM | POA: Diagnosis not present

## 2015-12-04 DIAGNOSIS — R944 Abnormal results of kidney function studies: Secondary | ICD-10-CM | POA: Diagnosis not present

## 2015-12-04 DIAGNOSIS — I482 Chronic atrial fibrillation: Secondary | ICD-10-CM | POA: Diagnosis not present

## 2015-12-04 DIAGNOSIS — E782 Mixed hyperlipidemia: Secondary | ICD-10-CM | POA: Diagnosis not present

## 2015-12-20 ENCOUNTER — Ambulatory Visit (INDEPENDENT_AMBULATORY_CARE_PROVIDER_SITE_OTHER): Payer: Medicare Other | Admitting: *Deleted

## 2015-12-20 DIAGNOSIS — I480 Paroxysmal atrial fibrillation: Secondary | ICD-10-CM | POA: Diagnosis not present

## 2015-12-20 DIAGNOSIS — Z5181 Encounter for therapeutic drug level monitoring: Secondary | ICD-10-CM | POA: Diagnosis not present

## 2015-12-20 DIAGNOSIS — I4891 Unspecified atrial fibrillation: Secondary | ICD-10-CM | POA: Diagnosis not present

## 2015-12-20 LAB — POCT INR: INR: 1.3

## 2015-12-27 ENCOUNTER — Ambulatory Visit (INDEPENDENT_AMBULATORY_CARE_PROVIDER_SITE_OTHER): Payer: Medicare Other | Admitting: *Deleted

## 2015-12-27 DIAGNOSIS — I480 Paroxysmal atrial fibrillation: Secondary | ICD-10-CM | POA: Diagnosis not present

## 2015-12-27 DIAGNOSIS — Z5181 Encounter for therapeutic drug level monitoring: Secondary | ICD-10-CM

## 2015-12-27 DIAGNOSIS — I4891 Unspecified atrial fibrillation: Secondary | ICD-10-CM

## 2015-12-27 LAB — POCT INR: INR: 1.9

## 2016-01-10 ENCOUNTER — Ambulatory Visit (INDEPENDENT_AMBULATORY_CARE_PROVIDER_SITE_OTHER): Payer: Medicare Other | Admitting: *Deleted

## 2016-01-10 DIAGNOSIS — Z5181 Encounter for therapeutic drug level monitoring: Secondary | ICD-10-CM

## 2016-01-10 DIAGNOSIS — I480 Paroxysmal atrial fibrillation: Secondary | ICD-10-CM

## 2016-01-10 DIAGNOSIS — I4891 Unspecified atrial fibrillation: Secondary | ICD-10-CM | POA: Diagnosis not present

## 2016-01-10 LAB — POCT INR: INR: 1.8

## 2016-01-24 ENCOUNTER — Ambulatory Visit (INDEPENDENT_AMBULATORY_CARE_PROVIDER_SITE_OTHER): Payer: Medicare Other | Admitting: *Deleted

## 2016-01-24 ENCOUNTER — Telehealth: Payer: Self-pay | Admitting: *Deleted

## 2016-01-24 DIAGNOSIS — R000 Tachycardia, unspecified: Secondary | ICD-10-CM

## 2016-01-24 DIAGNOSIS — I480 Paroxysmal atrial fibrillation: Secondary | ICD-10-CM | POA: Diagnosis not present

## 2016-01-24 DIAGNOSIS — I4891 Unspecified atrial fibrillation: Secondary | ICD-10-CM

## 2016-01-24 DIAGNOSIS — Z5181 Encounter for therapeutic drug level monitoring: Secondary | ICD-10-CM

## 2016-01-24 LAB — POCT INR: INR: 2.6

## 2016-01-24 NOTE — Telephone Encounter (Signed)
Agree with daily weights. When I evaluated her in March, HR was elevated and I increased metoprolol dose. Can have her wear a 24 hour Holter to monitor HR to see if she continues to have tachycardia, which can also contribute to shortness of breath.

## 2016-01-24 NOTE — Telephone Encounter (Signed)
Patient in office today for INR check. Pt c/o SOB. Pt states she has SOB when walking. Pt denies SOB when at rest. Son states pt has been c/o SOB since Dec 2016. He states that it has not changed from then. Patient was given daily wt log and told to weight daily. Please advise.

## 2016-01-25 NOTE — Telephone Encounter (Signed)
Patient notified and voiced understanding.

## 2016-01-29 ENCOUNTER — Ambulatory Visit (HOSPITAL_COMMUNITY)
Admission: RE | Admit: 2016-01-29 | Discharge: 2016-01-29 | Disposition: A | Discharge status: Home / Self Care | Payer: Medicare Other | Source: Ambulatory Visit | Attending: Cardiovascular Disease | Admitting: Cardiovascular Disease

## 2016-01-29 DIAGNOSIS — R000 Tachycardia, unspecified: Secondary | ICD-10-CM | POA: Insufficient documentation

## 2016-02-01 ENCOUNTER — Telehealth: Payer: Self-pay

## 2016-02-01 NOTE — Telephone Encounter (Signed)
PT was calling back to be sure she heard the results correctly from her holter monitor. I reviewed them with her again and she asked me to mail her a copy so that she could let her children see them.

## 2016-02-12 ENCOUNTER — Ambulatory Visit (INDEPENDENT_AMBULATORY_CARE_PROVIDER_SITE_OTHER): Payer: Medicare Other | Admitting: *Deleted

## 2016-02-12 DIAGNOSIS — Z5181 Encounter for therapeutic drug level monitoring: Secondary | ICD-10-CM

## 2016-02-12 DIAGNOSIS — I480 Paroxysmal atrial fibrillation: Secondary | ICD-10-CM | POA: Diagnosis not present

## 2016-02-12 DIAGNOSIS — I4891 Unspecified atrial fibrillation: Secondary | ICD-10-CM | POA: Diagnosis not present

## 2016-02-12 LAB — POCT INR: INR: 2.7

## 2016-02-28 ENCOUNTER — Ambulatory Visit (INDEPENDENT_AMBULATORY_CARE_PROVIDER_SITE_OTHER): Payer: Medicare Other | Admitting: Cardiovascular Disease

## 2016-02-28 ENCOUNTER — Encounter: Payer: Self-pay | Admitting: Cardiovascular Disease

## 2016-02-28 VITALS — BP 136/76 | HR 65 | Ht 67.0 in | Wt 204.0 lb

## 2016-02-28 DIAGNOSIS — I10 Essential (primary) hypertension: Secondary | ICD-10-CM

## 2016-02-28 DIAGNOSIS — R0609 Other forms of dyspnea: Secondary | ICD-10-CM

## 2016-02-28 DIAGNOSIS — I4891 Unspecified atrial fibrillation: Secondary | ICD-10-CM | POA: Diagnosis not present

## 2016-02-28 DIAGNOSIS — R600 Localized edema: Secondary | ICD-10-CM

## 2016-02-28 DIAGNOSIS — E785 Hyperlipidemia, unspecified: Secondary | ICD-10-CM | POA: Diagnosis not present

## 2016-02-28 NOTE — Progress Notes (Signed)
Patient ID: Glenda SowSarah T Viana, female   DOB: 05/15/1928, 82 y.o.   MRN: [id number]      SUBJECTIVE: The patient presents for follow up of atrial fibrillation. Echocardiogram on 11/06/15 demonstrated normal left ventricular systolic function, LVEF 50-55%, mild left atrial enlargement , and mild to moderate tricuspid regurgitation.  Her daughter-in-law tells me that she does not get much activity. The patient says she knows she should walk more. She has chronic exertional dyspnea which has improved since I increased her metoprolol at her last visit. Her daughter in law also feels that the patient's symptoms have improved.   Review of Systems: As per "subjective", otherwise negative.  Allergies  Allergen Reactions  . Benzalkonium Chloride-Alcohol   . Codeine     Current Outpatient Prescriptions  Medication Sig Dispense Refill  . amLODipine (NORVASC) 10 MG tablet Take 10 mg by mouth daily.    Marland Kitchen. atorvastatin (LIPITOR) 40 MG tablet Take 40 mg by mouth daily.      . Calcium Citrate-Vitamin D (CITRACAL + D PO) Take 1 tablet by mouth daily. D3    . furosemide (LASIX) 20 MG tablet Take 1 tablet (20 mg total) by mouth daily. 90 tablet 3  . GLIPIZIDE XL 5 MG 24 hr tablet TK 1 T PO  QPM WITH EVENING MEAL  2  . metoprolol (LOPRESSOR) 50 MG tablet Take 1 tablet (50 mg total) by mouth 2 (two) times daily. 180 tablet 3  . Multiple Vitamin (MULTIVITAMIN) tablet Take 1 tablet by mouth daily.    Marland Kitchen. PARoxetine (PAXIL) 20 MG tablet Take 20 mg by mouth every morning.      . ramipril (ALTACE) 5 MG capsule Take 1 capsule (5 mg total) by mouth daily. 30 capsule 3  . warfarin (COUMADIN) 2.5 MG tablet Take 1 tablet (2.5 mg total) by mouth daily. 60 tablet 3   No current facility-administered medications for this visit.    Past Medical History  Diagnosis Date  . Hypertension   . Diabetes mellitus type II     no insulin  . Fracture of left hip (HCC) 2006    intertrochanteric; ORIF  . Hyperlipidemia   .  Depression   . Atrial fibrillation (HCC)   . Anemia, normocytic normochromic 06/26/2012    H&H-11.2/33.7, normal MCV in 06/2012     Past Surgical History  Procedure Laterality Date  . Abdominal hysterectomy    . Intertrochanteric hip fracture surgery      Fixation  . Colonoscopy  2003    Negative screening study.    Social History   Social History  . Marital Status: Widowed    Spouse Name: N/A  . Number of Children: N/A  . Years of Education: N/A   Occupational History  . Not on file.   Social History Main Topics  . Smoking status: Never Smoker   . Smokeless tobacco: Never Used  . Alcohol Use: No  . Drug Use: No  . Sexual Activity: Not on file   Other Topics Concern  . Not on file   Social History Narrative     Filed Vitals:   02/28/16 1100  BP: 136/76  Pulse: 65  Height: 5\' 7"  (1.702 m)  Weight: 204 lb (92.534 kg)  SpO2: 90%    PHYSICAL EXAM General: NAD HEENT: Normal. Neck: No JVD, no thyromegaly. Lungs: Clear to auscultation bilaterally with normal respiratory effort. CV: Regular rate, irregular rhythm, normal S1/S2, no S3, no murmur. Trivial to 1+ pitting pretibial edema bilaterally.  Abdomen: Soft, nontender.  Neurologic: Alert and oriented.  Psych: Normal affect. Skin: Normal. Musculoskeletal: No gross deformities.   ECG: Most recent ECG reviewed.      ASSESSMENT AND PLAN: 1. Permanent atrial fibrillation: Stable. Continue metoprolol tartrate 50 mg bid. CHA2DS2Vasc score 6, thus high thromboembolic risk. Given CKD, will continue warfarin.   2. Chronic diastolic heart failure/bilateral leg edema: Euvolemic and likely related to atrial fibrillation. Will continue Lasix 20 mg daily. Instructed to take an extra 20 mg as needed  3. Essential HTN: Controlled. No changes.  4. Hyperlipidemia: Continue Lipitor 40 mg.  Dispo: f/u 6 months.   Prentice Docker, M.D., F.A.C.C.

## 2016-02-28 NOTE — Patient Instructions (Signed)
Medication Instructions:  YOU MAY TAKE AN EXTRA 20 MG OF LASIX DAILY AS NEEDED FOR LEG SWELLING   Labwork: NONE  Testing/Procedures: NONE  Follow-Up: Your physician wants you to follow-up in: 6 MONTHS .  You will receive a reminder letter in the mail two months in advance. If you don't receive a letter, please call our office to schedule the follow-up appointment.   Any Other Special Instructions Will Be Listed Below (If Applicable).     If you need a refill on your cardiac medications before your next appointment, please call your pharmacy.

## 2016-03-11 ENCOUNTER — Ambulatory Visit (INDEPENDENT_AMBULATORY_CARE_PROVIDER_SITE_OTHER): Payer: Medicare Other | Admitting: *Deleted

## 2016-03-11 DIAGNOSIS — Z5181 Encounter for therapeutic drug level monitoring: Secondary | ICD-10-CM | POA: Diagnosis not present

## 2016-03-11 DIAGNOSIS — I4891 Unspecified atrial fibrillation: Secondary | ICD-10-CM | POA: Diagnosis not present

## 2016-03-11 DIAGNOSIS — D509 Iron deficiency anemia, unspecified: Secondary | ICD-10-CM | POA: Diagnosis not present

## 2016-03-11 DIAGNOSIS — I480 Paroxysmal atrial fibrillation: Secondary | ICD-10-CM | POA: Diagnosis not present

## 2016-03-11 DIAGNOSIS — E782 Mixed hyperlipidemia: Secondary | ICD-10-CM | POA: Diagnosis not present

## 2016-03-11 DIAGNOSIS — E119 Type 2 diabetes mellitus without complications: Secondary | ICD-10-CM | POA: Diagnosis not present

## 2016-03-11 LAB — POCT INR: INR: 1.7

## 2016-03-13 DIAGNOSIS — R0602 Shortness of breath: Secondary | ICD-10-CM | POA: Diagnosis not present

## 2016-03-13 DIAGNOSIS — E441 Mild protein-calorie malnutrition: Secondary | ICD-10-CM | POA: Diagnosis not present

## 2016-03-13 DIAGNOSIS — E782 Mixed hyperlipidemia: Secondary | ICD-10-CM | POA: Diagnosis not present

## 2016-03-13 DIAGNOSIS — E119 Type 2 diabetes mellitus without complications: Secondary | ICD-10-CM | POA: Diagnosis not present

## 2016-03-13 DIAGNOSIS — I10 Essential (primary) hypertension: Secondary | ICD-10-CM | POA: Diagnosis not present

## 2016-03-13 DIAGNOSIS — E876 Hypokalemia: Secondary | ICD-10-CM | POA: Diagnosis not present

## 2016-03-13 DIAGNOSIS — R944 Abnormal results of kidney function studies: Secondary | ICD-10-CM | POA: Diagnosis not present

## 2016-04-08 ENCOUNTER — Ambulatory Visit (INDEPENDENT_AMBULATORY_CARE_PROVIDER_SITE_OTHER): Payer: Medicare Other | Admitting: *Deleted

## 2016-04-08 ENCOUNTER — Encounter: Payer: Self-pay | Admitting: *Deleted

## 2016-04-08 DIAGNOSIS — I10 Essential (primary) hypertension: Secondary | ICD-10-CM | POA: Diagnosis not present

## 2016-04-08 DIAGNOSIS — I4891 Unspecified atrial fibrillation: Secondary | ICD-10-CM | POA: Diagnosis not present

## 2016-04-08 DIAGNOSIS — Z5181 Encounter for therapeutic drug level monitoring: Secondary | ICD-10-CM

## 2016-04-08 DIAGNOSIS — I480 Paroxysmal atrial fibrillation: Secondary | ICD-10-CM

## 2016-04-08 DIAGNOSIS — D649 Anemia, unspecified: Secondary | ICD-10-CM | POA: Diagnosis not present

## 2016-04-08 DIAGNOSIS — E119 Type 2 diabetes mellitus without complications: Secondary | ICD-10-CM | POA: Diagnosis not present

## 2016-04-08 DIAGNOSIS — R0602 Shortness of breath: Secondary | ICD-10-CM | POA: Diagnosis not present

## 2016-04-08 DIAGNOSIS — D509 Iron deficiency anemia, unspecified: Secondary | ICD-10-CM | POA: Diagnosis not present

## 2016-04-08 DIAGNOSIS — R944 Abnormal results of kidney function studies: Secondary | ICD-10-CM | POA: Diagnosis not present

## 2016-04-08 DIAGNOSIS — R5383 Other fatigue: Secondary | ICD-10-CM | POA: Diagnosis not present

## 2016-04-08 DIAGNOSIS — E876 Hypokalemia: Secondary | ICD-10-CM | POA: Diagnosis not present

## 2016-04-08 DIAGNOSIS — I482 Chronic atrial fibrillation: Secondary | ICD-10-CM | POA: Diagnosis not present

## 2016-04-08 LAB — POCT INR: INR: 3.3

## 2016-04-15 ENCOUNTER — Other Ambulatory Visit (HOSPITAL_COMMUNITY): Payer: Self-pay | Admitting: Internal Medicine

## 2016-04-15 DIAGNOSIS — K224 Dyskinesia of esophagus: Secondary | ICD-10-CM

## 2016-04-17 ENCOUNTER — Other Ambulatory Visit: Payer: Self-pay

## 2016-04-17 DIAGNOSIS — E782 Mixed hyperlipidemia: Secondary | ICD-10-CM | POA: Diagnosis not present

## 2016-04-17 DIAGNOSIS — D509 Iron deficiency anemia, unspecified: Secondary | ICD-10-CM | POA: Diagnosis not present

## 2016-04-17 DIAGNOSIS — Z9181 History of falling: Secondary | ICD-10-CM | POA: Diagnosis not present

## 2016-04-17 DIAGNOSIS — M6281 Muscle weakness (generalized): Secondary | ICD-10-CM | POA: Diagnosis not present

## 2016-04-17 DIAGNOSIS — I482 Chronic atrial fibrillation: Secondary | ICD-10-CM | POA: Diagnosis not present

## 2016-04-17 DIAGNOSIS — E119 Type 2 diabetes mellitus without complications: Secondary | ICD-10-CM | POA: Diagnosis not present

## 2016-04-17 DIAGNOSIS — Z7901 Long term (current) use of anticoagulants: Secondary | ICD-10-CM | POA: Diagnosis not present

## 2016-04-17 DIAGNOSIS — I10 Essential (primary) hypertension: Secondary | ICD-10-CM | POA: Diagnosis not present

## 2016-04-17 DIAGNOSIS — Z7984 Long term (current) use of oral hypoglycemic drugs: Secondary | ICD-10-CM | POA: Diagnosis not present

## 2016-04-17 DIAGNOSIS — Z7409 Other reduced mobility: Secondary | ICD-10-CM | POA: Diagnosis not present

## 2016-04-17 MED ORDER — RAMIPRIL 5 MG PO CAPS: 5.0000 mg | ORAL_CAPSULE | Freq: Every day | ORAL | 3 refills | Status: DC

## 2016-04-22 ENCOUNTER — Ambulatory Visit (HOSPITAL_COMMUNITY): Payer: PRIVATE HEALTH INSURANCE

## 2016-04-25 DIAGNOSIS — E119 Type 2 diabetes mellitus without complications: Secondary | ICD-10-CM | POA: Diagnosis not present

## 2016-04-25 DIAGNOSIS — Z7409 Other reduced mobility: Secondary | ICD-10-CM | POA: Diagnosis not present

## 2016-04-25 DIAGNOSIS — M6281 Muscle weakness (generalized): Secondary | ICD-10-CM | POA: Diagnosis not present

## 2016-04-25 DIAGNOSIS — I10 Essential (primary) hypertension: Secondary | ICD-10-CM | POA: Diagnosis not present

## 2016-04-25 DIAGNOSIS — Z7901 Long term (current) use of anticoagulants: Secondary | ICD-10-CM | POA: Diagnosis not present

## 2016-04-25 DIAGNOSIS — I482 Chronic atrial fibrillation: Secondary | ICD-10-CM | POA: Diagnosis not present

## 2016-05-02 DIAGNOSIS — E119 Type 2 diabetes mellitus without complications: Secondary | ICD-10-CM | POA: Diagnosis not present

## 2016-05-02 DIAGNOSIS — I10 Essential (primary) hypertension: Secondary | ICD-10-CM | POA: Diagnosis not present

## 2016-05-02 DIAGNOSIS — I482 Chronic atrial fibrillation: Secondary | ICD-10-CM | POA: Diagnosis not present

## 2016-05-02 DIAGNOSIS — M6281 Muscle weakness (generalized): Secondary | ICD-10-CM | POA: Diagnosis not present

## 2016-05-02 DIAGNOSIS — Z7901 Long term (current) use of anticoagulants: Secondary | ICD-10-CM | POA: Diagnosis not present

## 2016-05-02 DIAGNOSIS — Z7409 Other reduced mobility: Secondary | ICD-10-CM | POA: Diagnosis not present

## 2016-05-03 DIAGNOSIS — I482 Chronic atrial fibrillation: Secondary | ICD-10-CM | POA: Diagnosis not present

## 2016-05-03 DIAGNOSIS — M6281 Muscle weakness (generalized): Secondary | ICD-10-CM | POA: Diagnosis not present

## 2016-05-03 DIAGNOSIS — E119 Type 2 diabetes mellitus without complications: Secondary | ICD-10-CM | POA: Diagnosis not present

## 2016-05-03 DIAGNOSIS — Z7901 Long term (current) use of anticoagulants: Secondary | ICD-10-CM | POA: Diagnosis not present

## 2016-05-03 DIAGNOSIS — I10 Essential (primary) hypertension: Secondary | ICD-10-CM | POA: Diagnosis not present

## 2016-05-03 DIAGNOSIS — Z7409 Other reduced mobility: Secondary | ICD-10-CM | POA: Diagnosis not present

## 2016-05-06 ENCOUNTER — Ambulatory Visit (INDEPENDENT_AMBULATORY_CARE_PROVIDER_SITE_OTHER): Payer: Medicare Other | Admitting: *Deleted

## 2016-05-06 DIAGNOSIS — I480 Paroxysmal atrial fibrillation: Secondary | ICD-10-CM

## 2016-05-06 DIAGNOSIS — Z5181 Encounter for therapeutic drug level monitoring: Secondary | ICD-10-CM

## 2016-05-06 DIAGNOSIS — I4891 Unspecified atrial fibrillation: Secondary | ICD-10-CM | POA: Diagnosis not present

## 2016-05-06 LAB — POCT INR: INR: 6

## 2016-05-07 DIAGNOSIS — I10 Essential (primary) hypertension: Secondary | ICD-10-CM | POA: Diagnosis not present

## 2016-05-07 DIAGNOSIS — E119 Type 2 diabetes mellitus without complications: Secondary | ICD-10-CM | POA: Diagnosis not present

## 2016-05-07 DIAGNOSIS — Z7409 Other reduced mobility: Secondary | ICD-10-CM | POA: Diagnosis not present

## 2016-05-07 DIAGNOSIS — M6281 Muscle weakness (generalized): Secondary | ICD-10-CM | POA: Diagnosis not present

## 2016-05-07 DIAGNOSIS — I482 Chronic atrial fibrillation: Secondary | ICD-10-CM | POA: Diagnosis not present

## 2016-05-07 DIAGNOSIS — Z7901 Long term (current) use of anticoagulants: Secondary | ICD-10-CM | POA: Diagnosis not present

## 2016-05-08 DIAGNOSIS — I10 Essential (primary) hypertension: Secondary | ICD-10-CM | POA: Diagnosis not present

## 2016-05-08 DIAGNOSIS — I482 Chronic atrial fibrillation: Secondary | ICD-10-CM | POA: Diagnosis not present

## 2016-05-08 DIAGNOSIS — Z7901 Long term (current) use of anticoagulants: Secondary | ICD-10-CM | POA: Diagnosis not present

## 2016-05-08 DIAGNOSIS — Z7409 Other reduced mobility: Secondary | ICD-10-CM | POA: Diagnosis not present

## 2016-05-08 DIAGNOSIS — M6281 Muscle weakness (generalized): Secondary | ICD-10-CM | POA: Diagnosis not present

## 2016-05-08 DIAGNOSIS — E119 Type 2 diabetes mellitus without complications: Secondary | ICD-10-CM | POA: Diagnosis not present

## 2016-05-10 DIAGNOSIS — Z7409 Other reduced mobility: Secondary | ICD-10-CM | POA: Diagnosis not present

## 2016-05-10 DIAGNOSIS — Z7901 Long term (current) use of anticoagulants: Secondary | ICD-10-CM | POA: Diagnosis not present

## 2016-05-10 DIAGNOSIS — M6281 Muscle weakness (generalized): Secondary | ICD-10-CM | POA: Diagnosis not present

## 2016-05-10 DIAGNOSIS — E119 Type 2 diabetes mellitus without complications: Secondary | ICD-10-CM | POA: Diagnosis not present

## 2016-05-10 DIAGNOSIS — I482 Chronic atrial fibrillation: Secondary | ICD-10-CM | POA: Diagnosis not present

## 2016-05-10 DIAGNOSIS — I10 Essential (primary) hypertension: Secondary | ICD-10-CM | POA: Diagnosis not present

## 2016-05-14 DIAGNOSIS — E119 Type 2 diabetes mellitus without complications: Secondary | ICD-10-CM | POA: Diagnosis not present

## 2016-05-14 DIAGNOSIS — M6281 Muscle weakness (generalized): Secondary | ICD-10-CM | POA: Diagnosis not present

## 2016-05-14 DIAGNOSIS — I482 Chronic atrial fibrillation: Secondary | ICD-10-CM | POA: Diagnosis not present

## 2016-05-14 DIAGNOSIS — Z7901 Long term (current) use of anticoagulants: Secondary | ICD-10-CM | POA: Diagnosis not present

## 2016-05-14 DIAGNOSIS — Z7409 Other reduced mobility: Secondary | ICD-10-CM | POA: Diagnosis not present

## 2016-05-14 DIAGNOSIS — I10 Essential (primary) hypertension: Secondary | ICD-10-CM | POA: Diagnosis not present

## 2016-05-16 DIAGNOSIS — Z7409 Other reduced mobility: Secondary | ICD-10-CM | POA: Diagnosis not present

## 2016-05-16 DIAGNOSIS — I10 Essential (primary) hypertension: Secondary | ICD-10-CM | POA: Diagnosis not present

## 2016-05-16 DIAGNOSIS — M6281 Muscle weakness (generalized): Secondary | ICD-10-CM | POA: Diagnosis not present

## 2016-05-16 DIAGNOSIS — I482 Chronic atrial fibrillation: Secondary | ICD-10-CM | POA: Diagnosis not present

## 2016-05-16 DIAGNOSIS — Z7901 Long term (current) use of anticoagulants: Secondary | ICD-10-CM | POA: Diagnosis not present

## 2016-05-16 DIAGNOSIS — E119 Type 2 diabetes mellitus without complications: Secondary | ICD-10-CM | POA: Diagnosis not present

## 2016-05-17 DIAGNOSIS — Z7901 Long term (current) use of anticoagulants: Secondary | ICD-10-CM | POA: Diagnosis not present

## 2016-05-17 DIAGNOSIS — Z7409 Other reduced mobility: Secondary | ICD-10-CM | POA: Diagnosis not present

## 2016-05-17 DIAGNOSIS — I10 Essential (primary) hypertension: Secondary | ICD-10-CM | POA: Diagnosis not present

## 2016-05-17 DIAGNOSIS — E119 Type 2 diabetes mellitus without complications: Secondary | ICD-10-CM | POA: Diagnosis not present

## 2016-05-17 DIAGNOSIS — I482 Chronic atrial fibrillation: Secondary | ICD-10-CM | POA: Diagnosis not present

## 2016-05-17 DIAGNOSIS — M6281 Muscle weakness (generalized): Secondary | ICD-10-CM | POA: Diagnosis not present

## 2016-05-20 ENCOUNTER — Ambulatory Visit (INDEPENDENT_AMBULATORY_CARE_PROVIDER_SITE_OTHER): Payer: Medicare Other | Admitting: *Deleted

## 2016-05-20 DIAGNOSIS — Z5181 Encounter for therapeutic drug level monitoring: Secondary | ICD-10-CM | POA: Diagnosis not present

## 2016-05-20 DIAGNOSIS — I4891 Unspecified atrial fibrillation: Secondary | ICD-10-CM

## 2016-05-20 DIAGNOSIS — I480 Paroxysmal atrial fibrillation: Secondary | ICD-10-CM | POA: Diagnosis not present

## 2016-05-20 LAB — POCT INR: INR: 1.9

## 2016-05-21 DIAGNOSIS — E119 Type 2 diabetes mellitus without complications: Secondary | ICD-10-CM | POA: Diagnosis not present

## 2016-05-21 DIAGNOSIS — Z7901 Long term (current) use of anticoagulants: Secondary | ICD-10-CM | POA: Diagnosis not present

## 2016-05-21 DIAGNOSIS — M6281 Muscle weakness (generalized): Secondary | ICD-10-CM | POA: Diagnosis not present

## 2016-05-21 DIAGNOSIS — I482 Chronic atrial fibrillation: Secondary | ICD-10-CM | POA: Diagnosis not present

## 2016-05-21 DIAGNOSIS — Z7409 Other reduced mobility: Secondary | ICD-10-CM | POA: Diagnosis not present

## 2016-05-21 DIAGNOSIS — I10 Essential (primary) hypertension: Secondary | ICD-10-CM | POA: Diagnosis not present

## 2016-05-28 DIAGNOSIS — I10 Essential (primary) hypertension: Secondary | ICD-10-CM | POA: Diagnosis not present

## 2016-05-28 DIAGNOSIS — E119 Type 2 diabetes mellitus without complications: Secondary | ICD-10-CM | POA: Diagnosis not present

## 2016-05-28 DIAGNOSIS — I482 Chronic atrial fibrillation: Secondary | ICD-10-CM | POA: Diagnosis not present

## 2016-05-28 DIAGNOSIS — Z7901 Long term (current) use of anticoagulants: Secondary | ICD-10-CM | POA: Diagnosis not present

## 2016-05-28 DIAGNOSIS — Z7409 Other reduced mobility: Secondary | ICD-10-CM | POA: Diagnosis not present

## 2016-05-28 DIAGNOSIS — M6281 Muscle weakness (generalized): Secondary | ICD-10-CM | POA: Diagnosis not present

## 2016-06-03 DIAGNOSIS — E119 Type 2 diabetes mellitus without complications: Secondary | ICD-10-CM | POA: Diagnosis not present

## 2016-06-03 DIAGNOSIS — M6281 Muscle weakness (generalized): Secondary | ICD-10-CM | POA: Diagnosis not present

## 2016-06-03 DIAGNOSIS — Z7901 Long term (current) use of anticoagulants: Secondary | ICD-10-CM | POA: Diagnosis not present

## 2016-06-03 DIAGNOSIS — I482 Chronic atrial fibrillation: Secondary | ICD-10-CM | POA: Diagnosis not present

## 2016-06-03 DIAGNOSIS — I10 Essential (primary) hypertension: Secondary | ICD-10-CM | POA: Diagnosis not present

## 2016-06-03 DIAGNOSIS — Z7409 Other reduced mobility: Secondary | ICD-10-CM | POA: Diagnosis not present

## 2016-06-06 DIAGNOSIS — I10 Essential (primary) hypertension: Secondary | ICD-10-CM | POA: Diagnosis not present

## 2016-06-06 DIAGNOSIS — Z7901 Long term (current) use of anticoagulants: Secondary | ICD-10-CM | POA: Diagnosis not present

## 2016-06-06 DIAGNOSIS — M6281 Muscle weakness (generalized): Secondary | ICD-10-CM | POA: Diagnosis not present

## 2016-06-06 DIAGNOSIS — E119 Type 2 diabetes mellitus without complications: Secondary | ICD-10-CM | POA: Diagnosis not present

## 2016-06-06 DIAGNOSIS — Z7409 Other reduced mobility: Secondary | ICD-10-CM | POA: Diagnosis not present

## 2016-06-06 DIAGNOSIS — I482 Chronic atrial fibrillation: Secondary | ICD-10-CM | POA: Diagnosis not present

## 2016-06-10 ENCOUNTER — Other Ambulatory Visit: Payer: Self-pay | Admitting: Cardiovascular Disease

## 2016-06-11 DIAGNOSIS — E119 Type 2 diabetes mellitus without complications: Secondary | ICD-10-CM | POA: Diagnosis not present

## 2016-06-11 DIAGNOSIS — Z7409 Other reduced mobility: Secondary | ICD-10-CM | POA: Diagnosis not present

## 2016-06-11 DIAGNOSIS — M6281 Muscle weakness (generalized): Secondary | ICD-10-CM | POA: Diagnosis not present

## 2016-06-11 DIAGNOSIS — I482 Chronic atrial fibrillation: Secondary | ICD-10-CM | POA: Diagnosis not present

## 2016-06-11 DIAGNOSIS — Z7901 Long term (current) use of anticoagulants: Secondary | ICD-10-CM | POA: Diagnosis not present

## 2016-06-11 DIAGNOSIS — I10 Essential (primary) hypertension: Secondary | ICD-10-CM | POA: Diagnosis not present

## 2016-06-12 ENCOUNTER — Ambulatory Visit (INDEPENDENT_AMBULATORY_CARE_PROVIDER_SITE_OTHER): Payer: Medicare Other | Admitting: *Deleted

## 2016-06-12 DIAGNOSIS — Z5181 Encounter for therapeutic drug level monitoring: Secondary | ICD-10-CM

## 2016-06-12 DIAGNOSIS — I4891 Unspecified atrial fibrillation: Secondary | ICD-10-CM

## 2016-06-12 DIAGNOSIS — I480 Paroxysmal atrial fibrillation: Secondary | ICD-10-CM | POA: Diagnosis not present

## 2016-06-12 LAB — POCT INR: INR: 1.4

## 2016-06-13 DIAGNOSIS — M6281 Muscle weakness (generalized): Secondary | ICD-10-CM | POA: Diagnosis not present

## 2016-06-13 DIAGNOSIS — E119 Type 2 diabetes mellitus without complications: Secondary | ICD-10-CM | POA: Diagnosis not present

## 2016-06-13 DIAGNOSIS — I482 Chronic atrial fibrillation: Secondary | ICD-10-CM | POA: Diagnosis not present

## 2016-06-13 DIAGNOSIS — Z7409 Other reduced mobility: Secondary | ICD-10-CM | POA: Diagnosis not present

## 2016-06-13 DIAGNOSIS — Z7901 Long term (current) use of anticoagulants: Secondary | ICD-10-CM | POA: Diagnosis not present

## 2016-06-13 DIAGNOSIS — I10 Essential (primary) hypertension: Secondary | ICD-10-CM | POA: Diagnosis not present

## 2016-06-16 DIAGNOSIS — Z7984 Long term (current) use of oral hypoglycemic drugs: Secondary | ICD-10-CM | POA: Diagnosis not present

## 2016-06-16 DIAGNOSIS — I10 Essential (primary) hypertension: Secondary | ICD-10-CM | POA: Diagnosis not present

## 2016-06-16 DIAGNOSIS — M6281 Muscle weakness (generalized): Secondary | ICD-10-CM | POA: Diagnosis not present

## 2016-06-16 DIAGNOSIS — I482 Chronic atrial fibrillation: Secondary | ICD-10-CM | POA: Diagnosis not present

## 2016-06-16 DIAGNOSIS — Z7901 Long term (current) use of anticoagulants: Secondary | ICD-10-CM | POA: Diagnosis not present

## 2016-06-16 DIAGNOSIS — E119 Type 2 diabetes mellitus without complications: Secondary | ICD-10-CM | POA: Diagnosis not present

## 2016-06-17 DIAGNOSIS — I10 Essential (primary) hypertension: Secondary | ICD-10-CM | POA: Diagnosis not present

## 2016-06-17 DIAGNOSIS — E119 Type 2 diabetes mellitus without complications: Secondary | ICD-10-CM | POA: Diagnosis not present

## 2016-06-17 DIAGNOSIS — Z7901 Long term (current) use of anticoagulants: Secondary | ICD-10-CM | POA: Diagnosis not present

## 2016-06-17 DIAGNOSIS — M6281 Muscle weakness (generalized): Secondary | ICD-10-CM | POA: Diagnosis not present

## 2016-06-17 DIAGNOSIS — Z7984 Long term (current) use of oral hypoglycemic drugs: Secondary | ICD-10-CM | POA: Diagnosis not present

## 2016-06-17 DIAGNOSIS — I482 Chronic atrial fibrillation: Secondary | ICD-10-CM | POA: Diagnosis not present

## 2016-06-20 DIAGNOSIS — I482 Chronic atrial fibrillation: Secondary | ICD-10-CM | POA: Diagnosis not present

## 2016-06-20 DIAGNOSIS — I10 Essential (primary) hypertension: Secondary | ICD-10-CM | POA: Diagnosis not present

## 2016-06-20 DIAGNOSIS — E119 Type 2 diabetes mellitus without complications: Secondary | ICD-10-CM | POA: Diagnosis not present

## 2016-06-20 DIAGNOSIS — Z7901 Long term (current) use of anticoagulants: Secondary | ICD-10-CM | POA: Diagnosis not present

## 2016-06-20 DIAGNOSIS — M6281 Muscle weakness (generalized): Secondary | ICD-10-CM | POA: Diagnosis not present

## 2016-06-20 DIAGNOSIS — Z7984 Long term (current) use of oral hypoglycemic drugs: Secondary | ICD-10-CM | POA: Diagnosis not present

## 2016-06-25 DIAGNOSIS — M6281 Muscle weakness (generalized): Secondary | ICD-10-CM | POA: Diagnosis not present

## 2016-06-25 DIAGNOSIS — Z7984 Long term (current) use of oral hypoglycemic drugs: Secondary | ICD-10-CM | POA: Diagnosis not present

## 2016-06-25 DIAGNOSIS — I10 Essential (primary) hypertension: Secondary | ICD-10-CM | POA: Diagnosis not present

## 2016-06-25 DIAGNOSIS — Z7901 Long term (current) use of anticoagulants: Secondary | ICD-10-CM | POA: Diagnosis not present

## 2016-06-25 DIAGNOSIS — I482 Chronic atrial fibrillation: Secondary | ICD-10-CM | POA: Diagnosis not present

## 2016-06-25 DIAGNOSIS — E119 Type 2 diabetes mellitus without complications: Secondary | ICD-10-CM | POA: Diagnosis not present

## 2016-06-26 ENCOUNTER — Ambulatory Visit (INDEPENDENT_AMBULATORY_CARE_PROVIDER_SITE_OTHER): Payer: Medicare Other | Admitting: *Deleted

## 2016-06-26 DIAGNOSIS — Z5181 Encounter for therapeutic drug level monitoring: Secondary | ICD-10-CM | POA: Diagnosis not present

## 2016-06-26 DIAGNOSIS — I4891 Unspecified atrial fibrillation: Secondary | ICD-10-CM

## 2016-06-26 DIAGNOSIS — I480 Paroxysmal atrial fibrillation: Secondary | ICD-10-CM

## 2016-06-26 LAB — POCT INR: INR: 1.8

## 2016-07-04 DIAGNOSIS — I10 Essential (primary) hypertension: Secondary | ICD-10-CM | POA: Diagnosis not present

## 2016-07-04 DIAGNOSIS — I482 Chronic atrial fibrillation: Secondary | ICD-10-CM | POA: Diagnosis not present

## 2016-07-04 DIAGNOSIS — E119 Type 2 diabetes mellitus without complications: Secondary | ICD-10-CM | POA: Diagnosis not present

## 2016-07-04 DIAGNOSIS — Z7901 Long term (current) use of anticoagulants: Secondary | ICD-10-CM | POA: Diagnosis not present

## 2016-07-04 DIAGNOSIS — M6281 Muscle weakness (generalized): Secondary | ICD-10-CM | POA: Diagnosis not present

## 2016-07-04 DIAGNOSIS — Z7984 Long term (current) use of oral hypoglycemic drugs: Secondary | ICD-10-CM | POA: Diagnosis not present

## 2016-07-05 DIAGNOSIS — I10 Essential (primary) hypertension: Secondary | ICD-10-CM | POA: Diagnosis not present

## 2016-07-05 DIAGNOSIS — Z7984 Long term (current) use of oral hypoglycemic drugs: Secondary | ICD-10-CM | POA: Diagnosis not present

## 2016-07-05 DIAGNOSIS — Z7901 Long term (current) use of anticoagulants: Secondary | ICD-10-CM | POA: Diagnosis not present

## 2016-07-05 DIAGNOSIS — M6281 Muscle weakness (generalized): Secondary | ICD-10-CM | POA: Diagnosis not present

## 2016-07-05 DIAGNOSIS — E119 Type 2 diabetes mellitus without complications: Secondary | ICD-10-CM | POA: Diagnosis not present

## 2016-07-05 DIAGNOSIS — I482 Chronic atrial fibrillation: Secondary | ICD-10-CM | POA: Diagnosis not present

## 2016-07-09 DIAGNOSIS — E119 Type 2 diabetes mellitus without complications: Secondary | ICD-10-CM | POA: Diagnosis not present

## 2016-07-09 DIAGNOSIS — I10 Essential (primary) hypertension: Secondary | ICD-10-CM | POA: Diagnosis not present

## 2016-07-09 DIAGNOSIS — M6281 Muscle weakness (generalized): Secondary | ICD-10-CM | POA: Diagnosis not present

## 2016-07-09 DIAGNOSIS — I482 Chronic atrial fibrillation: Secondary | ICD-10-CM | POA: Diagnosis not present

## 2016-07-09 DIAGNOSIS — Z7984 Long term (current) use of oral hypoglycemic drugs: Secondary | ICD-10-CM | POA: Diagnosis not present

## 2016-07-09 DIAGNOSIS — Z7901 Long term (current) use of anticoagulants: Secondary | ICD-10-CM | POA: Diagnosis not present

## 2016-07-11 DIAGNOSIS — Z7984 Long term (current) use of oral hypoglycemic drugs: Secondary | ICD-10-CM | POA: Diagnosis not present

## 2016-07-11 DIAGNOSIS — I482 Chronic atrial fibrillation: Secondary | ICD-10-CM | POA: Diagnosis not present

## 2016-07-11 DIAGNOSIS — E119 Type 2 diabetes mellitus without complications: Secondary | ICD-10-CM | POA: Diagnosis not present

## 2016-07-11 DIAGNOSIS — Z7901 Long term (current) use of anticoagulants: Secondary | ICD-10-CM | POA: Diagnosis not present

## 2016-07-11 DIAGNOSIS — I10 Essential (primary) hypertension: Secondary | ICD-10-CM | POA: Diagnosis not present

## 2016-07-11 DIAGNOSIS — M6281 Muscle weakness (generalized): Secondary | ICD-10-CM | POA: Diagnosis not present

## 2016-07-17 ENCOUNTER — Encounter (HOSPITAL_COMMUNITY): Payer: Self-pay | Admitting: Emergency Medicine

## 2016-07-17 ENCOUNTER — Emergency Department (HOSPITAL_COMMUNITY)
Admission: EM | Admit: 2016-07-17 | Discharge: 2016-07-17 | Disposition: A | Discharge status: Home / Self Care | Payer: Medicare Other | Attending: Emergency Medicine | Admitting: Emergency Medicine

## 2016-07-17 ENCOUNTER — Ambulatory Visit (INDEPENDENT_AMBULATORY_CARE_PROVIDER_SITE_OTHER): Payer: Medicare Other | Admitting: *Deleted

## 2016-07-17 ENCOUNTER — Emergency Department (HOSPITAL_COMMUNITY): Payer: Medicare Other

## 2016-07-17 DIAGNOSIS — I480 Paroxysmal atrial fibrillation: Secondary | ICD-10-CM | POA: Diagnosis not present

## 2016-07-17 DIAGNOSIS — Z7984 Long term (current) use of oral hypoglycemic drugs: Secondary | ICD-10-CM | POA: Diagnosis not present

## 2016-07-17 DIAGNOSIS — M10072 Idiopathic gout, left ankle and foot: Secondary | ICD-10-CM | POA: Diagnosis not present

## 2016-07-17 DIAGNOSIS — Z79899 Other long term (current) drug therapy: Secondary | ICD-10-CM | POA: Insufficient documentation

## 2016-07-17 DIAGNOSIS — N189 Chronic kidney disease, unspecified: Secondary | ICD-10-CM | POA: Insufficient documentation

## 2016-07-17 DIAGNOSIS — Z5181 Encounter for therapeutic drug level monitoring: Secondary | ICD-10-CM | POA: Diagnosis not present

## 2016-07-17 DIAGNOSIS — M109 Gout, unspecified: Secondary | ICD-10-CM | POA: Diagnosis not present

## 2016-07-17 DIAGNOSIS — I4891 Unspecified atrial fibrillation: Secondary | ICD-10-CM | POA: Diagnosis not present

## 2016-07-17 DIAGNOSIS — E1122 Type 2 diabetes mellitus with diabetic chronic kidney disease: Secondary | ICD-10-CM | POA: Diagnosis not present

## 2016-07-17 DIAGNOSIS — M79672 Pain in left foot: Secondary | ICD-10-CM | POA: Diagnosis not present

## 2016-07-17 DIAGNOSIS — I129 Hypertensive chronic kidney disease with stage 1 through stage 4 chronic kidney disease, or unspecified chronic kidney disease: Secondary | ICD-10-CM | POA: Insufficient documentation

## 2016-07-17 DIAGNOSIS — M7989 Other specified soft tissue disorders: Secondary | ICD-10-CM | POA: Diagnosis not present

## 2016-07-17 DIAGNOSIS — Z7901 Long term (current) use of anticoagulants: Secondary | ICD-10-CM | POA: Insufficient documentation

## 2016-07-17 LAB — BASIC METABOLIC PANEL
ANION GAP: 9 (ref 5–15)
BUN: 27 mg/dL — AB (ref 6–20)
CO2: 29 mmol/L (ref 22–32)
Calcium: 8.5 mg/dL — ABNORMAL LOW (ref 8.9–10.3)
Chloride: 99 mmol/L — ABNORMAL LOW (ref 101–111)
Creatinine, Ser: 1.76 mg/dL — ABNORMAL HIGH (ref 0.44–1.00)
GFR, EST AFRICAN AMERICAN: 29 mL/min — AB (ref >60)
GFR, EST NON AFRICAN AMERICAN: 25 mL/min — AB (ref >60)
Glucose, Bld: 84 mg/dL (ref 65–99)
POTASSIUM: 3.1 mmol/L — AB (ref 3.5–5.1)
SODIUM: 137 mmol/L (ref 135–145)

## 2016-07-17 LAB — CBC WITH DIFFERENTIAL/PLATELET
BASOS ABS: 0 10*3/uL (ref 0.0–0.1)
BASOS PCT: 0 %
EOS ABS: 0.2 10*3/uL (ref 0.0–0.7)
EOS PCT: 2 %
HCT: 33.5 % — ABNORMAL LOW (ref 36.0–46.0)
HEMOGLOBIN: 10.9 g/dL — AB (ref 12.0–15.0)
Lymphocytes Relative: 22 %
Lymphs Abs: 1.5 10*3/uL (ref 0.7–4.0)
MCH: 28.7 pg (ref 26.0–34.0)
MCHC: 32.5 g/dL (ref 30.0–36.0)
MCV: 88.2 fL (ref 78.0–100.0)
Monocytes Absolute: 0.5 10*3/uL (ref 0.1–1.0)
Monocytes Relative: 7 %
NEUTROS PCT: 69 %
Neutro Abs: 4.7 10*3/uL (ref 1.7–7.7)
PLATELETS: 143 10*3/uL — AB (ref 150–400)
RBC: 3.8 MIL/uL — AB (ref 3.87–5.11)
RDW: 15.2 % (ref 11.5–15.5)
WBC: 6.8 10*3/uL (ref 4.0–10.5)

## 2016-07-17 LAB — URIC ACID: URIC ACID, SERUM: 9.2 mg/dL — AB (ref 2.3–6.6)

## 2016-07-17 LAB — PROTIME-INR
INR: 2.06
PROTHROMBIN TIME: 23.5 s — AB (ref 11.4–15.2)

## 2016-07-17 LAB — SEDIMENTATION RATE: SED RATE: 42 mm/h — AB (ref 0–22)

## 2016-07-17 LAB — POCT INR: INR: 3

## 2016-07-17 LAB — APTT: APTT: 54 s — AB (ref 24–36)

## 2016-07-17 MED ORDER — PREDNISONE 20 MG PO TABS: 20.0000 mg | ORAL_TABLET | Freq: Two times a day (BID) | ORAL | 0 refills | Status: DC

## 2016-07-17 MED ORDER — PREDNISONE 20 MG PO TABS
40.0000 mg | ORAL_TABLET | Freq: Once | ORAL | Status: AC
  Administered 2016-07-17: 40 mg via ORAL
  Filled 2016-07-17: qty 2

## 2016-07-17 NOTE — ED Triage Notes (Signed)
Patient complaining of pain and redness to left foot. Patient has redness and swelling noted to top and side of left foot at triage.

## 2016-07-17 NOTE — ED Provider Notes (Signed)
[facility]-EMERGENCY DEPT Provider Note   CSN: [id number] Arrival date & time: 07/17/16  1554     History   Chief Complaint Chief Complaint  Patient presents with  . Foot Pain    HPI Glenda Dickson is a 82 y.o. female. She complains of left foot swelling intermittently for 3 weeks, without trauma. She is using "red oil", a liniment, without relief. She denies fever, chills, nausea, vomiting, weakness or dizziness. She has a history of podagra, but has not had any gout problems in quite some time. She denies chest pain, cough or shortness of breath. There are no other known modifying factors.  HPI  Past Medical History:  Diagnosis Date  . Anemia, normocytic normochromic 06/26/2012   H&H-11.2/33.7, normal MCV in 06/2012   . Atrial fibrillation (HCC)   . Depression   . Diabetes mellitus type II    no insulin  . Fracture of left hip (HCC) 2006   intertrochanteric; ORIF  . Hyperlipidemia   . Hypertension     Patient Active Problem List   Diagnosis Date Noted  . Encounter for therapeutic drug monitoring 11/06/2015  . Chronic kidney disease 08/24/2012  . Cerebrovascular disease 07/20/2012  . Anemia, normocytic normochromic 06/26/2012  . Hypertension   . Diabetes mellitus type II   . Hyperlipidemia   . Atrial fibrillation [facility]     Past Surgical History:  Procedure Laterality Date  . ABDOMINAL HYSTERECTOMY    . COLONOSCOPY  2003   Negative screening study.  Drucilla Chalet HIP FRACTURE SURGERY     Fixation    OB History    No data available       Home Medications    Prior to Admission medications   Medication Sig Start Date End Date Taking? Authorizing Provider  amLODipine (NORVASC) 10 MG tablet Take 10 mg by mouth daily.   Yes Historical Provider, MD  atorvastatin (LIPITOR) 40 MG tablet Take 40 mg by mouth daily.     Yes Historical Provider, MD  Calcium Citrate-Vitamin D (CITRACAL + D PO) Take 1 tablet by mouth daily. D3   Yes Historical Provider, MD    ferrous sulfate 325 (65 FE) MG tablet Take 325 mg by mouth daily. 06/29/16  Yes Historical Provider, MD  furosemide (LASIX) 20 MG tablet Take 1 tablet (20 mg total) by mouth daily. 10/30/15  Yes Laqueta Linden, MD  glipiZIDE (GLUCOTROL XL) 2.5 MG 24 hr tablet Take 2.5 mg by mouth daily with breakfast.   Yes Historical Provider, MD  metoprolol (LOPRESSOR) 50 MG tablet Take 1 tablet (50 mg total) by mouth 2 (two) times daily. 11/27/15  Yes Laqueta Linden, MD  Multiple Vitamin (MULTIVITAMIN) tablet Take 1 tablet by mouth daily.   Yes Historical Provider, MD  PARoxetine (PAXIL) 20 MG tablet Take 20 mg by mouth every evening.    Yes Historical Provider, MD  potassium chloride (K-DUR,KLOR-CON) 10 MEQ tablet Take 10 mEq by mouth daily. 05/28/16  Yes Historical Provider, MD  ramipril (ALTACE) 5 MG capsule Take 1 capsule (5 mg total) by mouth daily. 04/17/16  Yes Laqueta Linden, MD  warfarin (COUMADIN) 2.5 MG tablet Take 1 tablet daily except 1 1/2 tablets on Mondays Patient taking differently: Take 2.5-3.75 mg by mouth See admin instructions. Takes 2.5mg  on all days, except on Mondays, Wednesdays, and Fridays. Patient takes 3.75mg  on Mondays, Wednesdays, and Fridays 06/10/16  Yes Laqueta Linden, MD  predniSONE (DELTASONE) 20 MG tablet Take 1 tablet (20 mg total) by  mouth 2 (two) times daily. 07/17/16   Mancel Bale, MD    Family History Family History  Problem Relation Age of Onset  . Hypertension Mother   . Heart failure Mother   . Heart attack Father   . Hypertension Father   . Hypertension Sister     Atrial Fib  . Hypertension Brother     4/6 brothers with htn    Social History Social History  Substance Use Topics  . Smoking status: Never Smoker  . Smokeless tobacco: Never Used  . Alcohol use No     Allergies   Benzalkonium chloride-alcohol and Codeine   Review of Systems Review of Systems  All other systems reviewed and are negative.    Physical Exam Updated  Vital Signs BP 121/69 (BP Location: Left Arm)   Pulse 84   Temp 98.2 F (36.8 C) (Oral)   Resp 18   Ht 5\' 6"  (1.676 m)   Wt 170 lb (77.1 kg)   SpO2 99%   BMI 27.44 kg/m   Physical Exam  Constitutional: She is oriented to person, place, and time. She appears well-developed and well-nourished.  HENT:  Head: Normocephalic and atraumatic.  Eyes: Conjunctivae and EOM are normal. Pupils are equal, round, and reactive to light.  Neck: Normal range of motion and phonation normal. Neck supple.  Cardiovascular: Normal rate and regular rhythm.   Pulmonary/Chest: Effort normal and breath sounds normal. She exhibits no tenderness.  Abdominal: Soft. She exhibits no distension. There is no tenderness. There is no guarding.  Musculoskeletal:  Tender and swollen left foot with erythema, dorsally. No proximal streaking. Normal sensation and circulation of the toes. No swelling of the left lower leg, and the right lower leg appears normal.  Neurological: She is alert and oriented to person, place, and time. She exhibits normal muscle tone.  Skin: Skin is warm and dry.  Psychiatric: She has a normal mood and affect. Her behavior is normal. Judgment and thought content normal.  Nursing note and vitals reviewed.    ED Treatments / Results  Labs (all labs ordered are listed, but only abnormal results are displayed) Labs Reviewed  PROTIME-INR - Abnormal; Notable for the following:       Result Value   Prothrombin Time 23.5 (*)    All other components within normal limits  APTT - Abnormal; Notable for the following:    aPTT 54 (*)    All other components within normal limits  CBC WITH DIFFERENTIAL/PLATELET - Abnormal; Notable for the following:    RBC 3.80 (*)    Hemoglobin 10.9 (*)    HCT 33.5 (*)    Platelets 143 (*)    All other components within normal limits  BASIC METABOLIC PANEL - Abnormal; Notable for the following:    Potassium 3.1 (*)    Chloride 99 (*)    BUN 27 (*)     Creatinine, Ser 1.76 (*)    Calcium 8.5 (*)    GFR calc non Af Amer 25 (*)    GFR calc Af Amer 29 (*)    All other components within normal limits  URIC ACID - Abnormal; Notable for the following:    Uric Acid, Serum 9.2 (*)    All other components within normal limits  SEDIMENTATION RATE - Abnormal; Notable for the following:    Sed Rate 42 (*)    All other components within normal limits    EKG  EKG Interpretation None  Radiology Dg Foot Complete Left  Result Date: 07/17/2016 CLINICAL DATA:  CT pain, swelling and pain of the left foot without injury times 3-4 weeks. History of diabetes. EXAM: LEFT FOOT - COMPLETE 3+ VIEW COMPARISON:  None. FINDINGS: There is soft tissue swelling about the forefoot and dorsum of the ankle and midfoot. Plantar and dorsal calcaneal enthesophytes are noted. Vascular calcifications are identified about the ankle and foot. No bone destruction is identified. There is osteoarthritic joint space narrowing of the second through fifth DIP, interphalangeal joint of the great toe and first MTP articulation. Accessory ossicle adjacent to the cuboid. IMPRESSION: No acute osseous abnormality of the left foot. Mild nonspecific soft tissue swelling of the foot. No radiographic evidence of osteomyelitis. Electronically Signed   By: Tollie Ethavid  Kwon M.D.   On: 07/17/2016 17:30    Procedures Procedures (including critical care time)  Medications Ordered in ED Medications  predniSONE (DELTASONE) tablet 40 mg (40 mg Oral Given 07/17/16 1927)     Initial Impression / Assessment and Plan / ED Course  I have reviewed the triage vital signs and the nursing notes.  Pertinent labs & imaging results that were available during my care of the patient were reviewed by me and considered in my medical decision making (see chart for details).  Clinical Course  Value Comment By Time  DG Foot Complete Left Mild DJD lateral mid-foot, no fracture or osteo Mancel BaleElliott Keyla Milone, MD  10/25 1804  Sed Rate: (!) [address] High  Kieley Akter, MD 10/26 0040  Uric Acid, Serum: (!) 9.2 high Mancel BaleElliott Bryleigh Ottaway, MD 10/26 0040    Medications  predniSONE (DELTASONE) tablet 40 mg (40 mg Oral Given 07/17/16 1927)    Patient Vitals for the past 24 hrs:  BP Temp Temp src Pulse Resp SpO2 Height Weight  07/17/16 1929 121/69 - - 84 18 99 % - -  07/17/16 1827 120/63 - - 75 17 95 % - -  07/17/16 1602 - - - - - - 5\' 6"  (1.676 m) 170 lb (77.1 kg)  07/17/16 1600 (!) 127/52 98.2 F (36.8 C) Oral 91 17 96 % - -    At D/C Reevaluation with update and discussion. After initial assessment and treatment, an updated evaluation reveals No additional complaints. Findings discussed with patient and all questions were answered. Jamarl Pew L    Final Clinical Impressions(s) / ED Diagnoses   Final diagnoses:  Acute gout of left foot, unspecified cause    Left foot pain with swelling related to gout. Doubt cellulitis, septic arthritis, or osteomyelitis.   Nursing Notes Reviewed/ Care Coordinated Applicable Imaging Reviewed Interpretation of Laboratory Data incorporated into ED treatment  The patient appears reasonably screened and/or stabilized for discharge and I doubt any other medical condition or other [facility] requiring further screening, evaluation, or treatment in the ED at this time prior to discharge.  Plan: Home Medications- continue; Home Treatments- rest, elevation; return here if the recommended treatment, does not improve the symptoms; Recommended follow up- PCP 1 week  New Prescriptions Discharge Medication List as of 07/17/2016  7:18 PM    START taking these medications   Details  predniSONE (DELTASONE) 20 MG tablet Take 1 tablet (20 mg total) by mouth 2 (two) times daily., Starting Wed 07/17/2016, Print         Mancel BaleElliott Dalana Pfahler, MD 07/18/16 [id number]

## 2016-07-19 DIAGNOSIS — I482 Chronic atrial fibrillation: Secondary | ICD-10-CM | POA: Diagnosis not present

## 2016-07-19 DIAGNOSIS — E119 Type 2 diabetes mellitus without complications: Secondary | ICD-10-CM | POA: Diagnosis not present

## 2016-07-19 DIAGNOSIS — Z7901 Long term (current) use of anticoagulants: Secondary | ICD-10-CM | POA: Diagnosis not present

## 2016-07-19 DIAGNOSIS — I10 Essential (primary) hypertension: Secondary | ICD-10-CM | POA: Diagnosis not present

## 2016-07-19 DIAGNOSIS — M6281 Muscle weakness (generalized): Secondary | ICD-10-CM | POA: Diagnosis not present

## 2016-07-19 DIAGNOSIS — Z7984 Long term (current) use of oral hypoglycemic drugs: Secondary | ICD-10-CM | POA: Diagnosis not present

## 2016-07-29 DIAGNOSIS — N183 Chronic kidney disease, stage 3 (moderate): Secondary | ICD-10-CM | POA: Diagnosis not present

## 2016-07-29 DIAGNOSIS — M10072 Idiopathic gout, left ankle and foot: Secondary | ICD-10-CM | POA: Diagnosis not present

## 2016-07-30 DIAGNOSIS — I482 Chronic atrial fibrillation: Secondary | ICD-10-CM | POA: Diagnosis not present

## 2016-07-30 DIAGNOSIS — E119 Type 2 diabetes mellitus without complications: Secondary | ICD-10-CM | POA: Diagnosis not present

## 2016-07-30 DIAGNOSIS — I10 Essential (primary) hypertension: Secondary | ICD-10-CM | POA: Diagnosis not present

## 2016-07-30 DIAGNOSIS — Z7901 Long term (current) use of anticoagulants: Secondary | ICD-10-CM | POA: Diagnosis not present

## 2016-07-30 DIAGNOSIS — M6281 Muscle weakness (generalized): Secondary | ICD-10-CM | POA: Diagnosis not present

## 2016-07-30 DIAGNOSIS — Z7984 Long term (current) use of oral hypoglycemic drugs: Secondary | ICD-10-CM | POA: Diagnosis not present

## 2016-07-31 ENCOUNTER — Encounter: Payer: Self-pay | Admitting: *Deleted

## 2016-08-06 DIAGNOSIS — M6281 Muscle weakness (generalized): Secondary | ICD-10-CM | POA: Diagnosis not present

## 2016-08-06 DIAGNOSIS — E119 Type 2 diabetes mellitus without complications: Secondary | ICD-10-CM | POA: Diagnosis not present

## 2016-08-06 DIAGNOSIS — Z7901 Long term (current) use of anticoagulants: Secondary | ICD-10-CM | POA: Diagnosis not present

## 2016-08-06 DIAGNOSIS — I482 Chronic atrial fibrillation: Secondary | ICD-10-CM | POA: Diagnosis not present

## 2016-08-06 DIAGNOSIS — I10 Essential (primary) hypertension: Secondary | ICD-10-CM | POA: Diagnosis not present

## 2016-08-06 DIAGNOSIS — Z7984 Long term (current) use of oral hypoglycemic drugs: Secondary | ICD-10-CM | POA: Diagnosis not present

## 2016-08-08 DIAGNOSIS — I482 Chronic atrial fibrillation: Secondary | ICD-10-CM | POA: Diagnosis not present

## 2016-08-08 DIAGNOSIS — Z7984 Long term (current) use of oral hypoglycemic drugs: Secondary | ICD-10-CM | POA: Diagnosis not present

## 2016-08-08 DIAGNOSIS — E119 Type 2 diabetes mellitus without complications: Secondary | ICD-10-CM | POA: Diagnosis not present

## 2016-08-08 DIAGNOSIS — Z7901 Long term (current) use of anticoagulants: Secondary | ICD-10-CM | POA: Diagnosis not present

## 2016-08-08 DIAGNOSIS — M6281 Muscle weakness (generalized): Secondary | ICD-10-CM | POA: Diagnosis not present

## 2016-08-08 DIAGNOSIS — I10 Essential (primary) hypertension: Secondary | ICD-10-CM | POA: Diagnosis not present

## 2016-08-14 DIAGNOSIS — M6281 Muscle weakness (generalized): Secondary | ICD-10-CM | POA: Diagnosis not present

## 2016-08-14 DIAGNOSIS — Z7901 Long term (current) use of anticoagulants: Secondary | ICD-10-CM | POA: Diagnosis not present

## 2016-08-14 DIAGNOSIS — E119 Type 2 diabetes mellitus without complications: Secondary | ICD-10-CM | POA: Diagnosis not present

## 2016-08-14 DIAGNOSIS — I10 Essential (primary) hypertension: Secondary | ICD-10-CM | POA: Diagnosis not present

## 2016-08-14 DIAGNOSIS — Z7984 Long term (current) use of oral hypoglycemic drugs: Secondary | ICD-10-CM | POA: Diagnosis not present

## 2016-08-14 DIAGNOSIS — I482 Chronic atrial fibrillation: Secondary | ICD-10-CM | POA: Diagnosis not present

## 2016-08-15 DIAGNOSIS — I10 Essential (primary) hypertension: Secondary | ICD-10-CM | POA: Diagnosis not present

## 2016-08-15 DIAGNOSIS — E119 Type 2 diabetes mellitus without complications: Secondary | ICD-10-CM | POA: Diagnosis not present

## 2016-08-15 DIAGNOSIS — Z7901 Long term (current) use of anticoagulants: Secondary | ICD-10-CM | POA: Diagnosis not present

## 2016-08-15 DIAGNOSIS — I482 Chronic atrial fibrillation: Secondary | ICD-10-CM | POA: Diagnosis not present

## 2016-08-15 DIAGNOSIS — Z7984 Long term (current) use of oral hypoglycemic drugs: Secondary | ICD-10-CM | POA: Diagnosis not present

## 2016-08-15 DIAGNOSIS — E782 Mixed hyperlipidemia: Secondary | ICD-10-CM | POA: Diagnosis not present

## 2016-08-15 DIAGNOSIS — M6281 Muscle weakness (generalized): Secondary | ICD-10-CM | POA: Diagnosis not present

## 2016-08-15 DIAGNOSIS — D509 Iron deficiency anemia, unspecified: Secondary | ICD-10-CM | POA: Diagnosis not present

## 2016-08-20 DIAGNOSIS — E119 Type 2 diabetes mellitus without complications: Secondary | ICD-10-CM | POA: Diagnosis not present

## 2016-08-20 DIAGNOSIS — Z7901 Long term (current) use of anticoagulants: Secondary | ICD-10-CM | POA: Diagnosis not present

## 2016-08-20 DIAGNOSIS — I10 Essential (primary) hypertension: Secondary | ICD-10-CM | POA: Diagnosis not present

## 2016-08-20 DIAGNOSIS — Z7984 Long term (current) use of oral hypoglycemic drugs: Secondary | ICD-10-CM | POA: Diagnosis not present

## 2016-08-20 DIAGNOSIS — I482 Chronic atrial fibrillation: Secondary | ICD-10-CM | POA: Diagnosis not present

## 2016-08-20 DIAGNOSIS — M6281 Muscle weakness (generalized): Secondary | ICD-10-CM | POA: Diagnosis not present

## 2016-08-22 DIAGNOSIS — E785 Hyperlipidemia, unspecified: Secondary | ICD-10-CM | POA: Diagnosis not present

## 2016-08-22 DIAGNOSIS — I10 Essential (primary) hypertension: Secondary | ICD-10-CM | POA: Diagnosis not present

## 2016-08-22 DIAGNOSIS — E782 Mixed hyperlipidemia: Secondary | ICD-10-CM | POA: Diagnosis not present

## 2016-08-22 DIAGNOSIS — R7301 Impaired fasting glucose: Secondary | ICD-10-CM | POA: Diagnosis not present

## 2016-08-22 DIAGNOSIS — E039 Hypothyroidism, unspecified: Secondary | ICD-10-CM | POA: Diagnosis not present

## 2016-08-22 DIAGNOSIS — E119 Type 2 diabetes mellitus without complications: Secondary | ICD-10-CM | POA: Diagnosis not present

## 2016-08-22 DIAGNOSIS — K700 Alcoholic fatty liver: Secondary | ICD-10-CM | POA: Diagnosis not present

## 2016-08-22 DIAGNOSIS — R300 Dysuria: Secondary | ICD-10-CM | POA: Diagnosis not present

## 2016-08-27 DIAGNOSIS — I482 Chronic atrial fibrillation: Secondary | ICD-10-CM | POA: Diagnosis not present

## 2016-08-27 DIAGNOSIS — R944 Abnormal results of kidney function studies: Secondary | ICD-10-CM | POA: Diagnosis not present

## 2016-08-27 DIAGNOSIS — E119 Type 2 diabetes mellitus without complications: Secondary | ICD-10-CM | POA: Diagnosis not present

## 2016-08-27 DIAGNOSIS — E782 Mixed hyperlipidemia: Secondary | ICD-10-CM | POA: Diagnosis not present

## 2016-08-27 DIAGNOSIS — E46 Unspecified protein-calorie malnutrition: Secondary | ICD-10-CM | POA: Diagnosis not present

## 2016-08-27 DIAGNOSIS — D509 Iron deficiency anemia, unspecified: Secondary | ICD-10-CM | POA: Diagnosis not present

## 2016-08-27 DIAGNOSIS — Z0001 Encounter for general adult medical examination with abnormal findings: Secondary | ICD-10-CM | POA: Diagnosis not present

## 2016-08-27 DIAGNOSIS — I10 Essential (primary) hypertension: Secondary | ICD-10-CM | POA: Diagnosis not present

## 2016-08-27 DIAGNOSIS — Z23 Encounter for immunization: Secondary | ICD-10-CM | POA: Diagnosis not present

## 2016-08-30 DIAGNOSIS — M6281 Muscle weakness (generalized): Secondary | ICD-10-CM | POA: Diagnosis not present

## 2016-08-30 DIAGNOSIS — I482 Chronic atrial fibrillation: Secondary | ICD-10-CM | POA: Diagnosis not present

## 2016-08-30 DIAGNOSIS — Z7984 Long term (current) use of oral hypoglycemic drugs: Secondary | ICD-10-CM | POA: Diagnosis not present

## 2016-08-30 DIAGNOSIS — Z7901 Long term (current) use of anticoagulants: Secondary | ICD-10-CM | POA: Diagnosis not present

## 2016-08-30 DIAGNOSIS — I10 Essential (primary) hypertension: Secondary | ICD-10-CM | POA: Diagnosis not present

## 2016-08-30 DIAGNOSIS — E119 Type 2 diabetes mellitus without complications: Secondary | ICD-10-CM | POA: Diagnosis not present

## 2016-09-04 DIAGNOSIS — E119 Type 2 diabetes mellitus without complications: Secondary | ICD-10-CM | POA: Diagnosis not present

## 2016-09-04 DIAGNOSIS — I10 Essential (primary) hypertension: Secondary | ICD-10-CM | POA: Diagnosis not present

## 2016-09-04 DIAGNOSIS — M6281 Muscle weakness (generalized): Secondary | ICD-10-CM | POA: Diagnosis not present

## 2016-09-04 DIAGNOSIS — Z7984 Long term (current) use of oral hypoglycemic drugs: Secondary | ICD-10-CM | POA: Diagnosis not present

## 2016-09-04 DIAGNOSIS — Z7901 Long term (current) use of anticoagulants: Secondary | ICD-10-CM | POA: Diagnosis not present

## 2016-09-04 DIAGNOSIS — I482 Chronic atrial fibrillation: Secondary | ICD-10-CM | POA: Diagnosis not present

## 2016-09-06 ENCOUNTER — Other Ambulatory Visit: Payer: Self-pay | Admitting: Cardiovascular Disease

## 2016-09-09 ENCOUNTER — Other Ambulatory Visit: Payer: Self-pay | Admitting: Cardiology

## 2016-09-10 DIAGNOSIS — Z7901 Long term (current) use of anticoagulants: Secondary | ICD-10-CM | POA: Diagnosis not present

## 2016-09-10 DIAGNOSIS — M6281 Muscle weakness (generalized): Secondary | ICD-10-CM | POA: Diagnosis not present

## 2016-09-10 DIAGNOSIS — I482 Chronic atrial fibrillation: Secondary | ICD-10-CM | POA: Diagnosis not present

## 2016-09-10 DIAGNOSIS — I10 Essential (primary) hypertension: Secondary | ICD-10-CM | POA: Diagnosis not present

## 2016-09-10 DIAGNOSIS — Z7984 Long term (current) use of oral hypoglycemic drugs: Secondary | ICD-10-CM | POA: Diagnosis not present

## 2016-09-10 DIAGNOSIS — E119 Type 2 diabetes mellitus without complications: Secondary | ICD-10-CM | POA: Diagnosis not present

## 2016-09-11 DIAGNOSIS — I10 Essential (primary) hypertension: Secondary | ICD-10-CM | POA: Diagnosis not present

## 2016-09-11 DIAGNOSIS — E119 Type 2 diabetes mellitus without complications: Secondary | ICD-10-CM | POA: Diagnosis not present

## 2016-09-11 DIAGNOSIS — Z7984 Long term (current) use of oral hypoglycemic drugs: Secondary | ICD-10-CM | POA: Diagnosis not present

## 2016-09-11 DIAGNOSIS — I482 Chronic atrial fibrillation: Secondary | ICD-10-CM | POA: Diagnosis not present

## 2016-09-11 DIAGNOSIS — M6281 Muscle weakness (generalized): Secondary | ICD-10-CM | POA: Diagnosis not present

## 2016-09-11 DIAGNOSIS — Z7901 Long term (current) use of anticoagulants: Secondary | ICD-10-CM | POA: Diagnosis not present

## 2016-09-20 DIAGNOSIS — B3789 Other sites of candidiasis: Secondary | ICD-10-CM | POA: Diagnosis not present

## 2016-09-20 DIAGNOSIS — I872 Venous insufficiency (chronic) (peripheral): Secondary | ICD-10-CM | POA: Diagnosis not present

## 2016-09-21 ENCOUNTER — Other Ambulatory Visit: Payer: Self-pay | Admitting: Cardiovascular Disease

## 2016-09-29 ENCOUNTER — Inpatient Hospital Stay (HOSPITAL_COMMUNITY)
Admission: EM | Admit: 2016-09-29 | Discharge: 2016-10-02 | DRG: 193 | Disposition: A | Discharge status: Skilled Nursing Facility | Payer: Medicare Other | Attending: Internal Medicine | Admitting: Internal Medicine

## 2016-09-29 ENCOUNTER — Encounter (HOSPITAL_COMMUNITY): Payer: Self-pay | Admitting: Emergency Medicine

## 2016-09-29 ENCOUNTER — Emergency Department (HOSPITAL_COMMUNITY): Payer: Medicare Other

## 2016-09-29 DIAGNOSIS — J189 Pneumonia, unspecified organism: Secondary | ICD-10-CM | POA: Diagnosis not present

## 2016-09-29 DIAGNOSIS — I10 Essential (primary) hypertension: Secondary | ICD-10-CM | POA: Diagnosis present

## 2016-09-29 DIAGNOSIS — I482 Chronic atrial fibrillation: Secondary | ICD-10-CM | POA: Diagnosis not present

## 2016-09-29 DIAGNOSIS — I5032 Chronic diastolic (congestive) heart failure: Secondary | ICD-10-CM | POA: Diagnosis present

## 2016-09-29 DIAGNOSIS — E1122 Type 2 diabetes mellitus with diabetic chronic kidney disease: Secondary | ICD-10-CM | POA: Diagnosis not present

## 2016-09-29 DIAGNOSIS — N184 Chronic kidney disease, stage 4 (severe): Secondary | ICD-10-CM | POA: Diagnosis present

## 2016-09-29 DIAGNOSIS — E785 Hyperlipidemia, unspecified: Secondary | ICD-10-CM | POA: Diagnosis present

## 2016-09-29 DIAGNOSIS — Z7984 Long term (current) use of oral hypoglycemic drugs: Secondary | ICD-10-CM

## 2016-09-29 DIAGNOSIS — M6281 Muscle weakness (generalized): Secondary | ICD-10-CM

## 2016-09-29 DIAGNOSIS — E46 Unspecified protein-calorie malnutrition: Secondary | ICD-10-CM | POA: Diagnosis present

## 2016-09-29 DIAGNOSIS — R791 Abnormal coagulation profile: Secondary | ICD-10-CM | POA: Diagnosis present

## 2016-09-29 DIAGNOSIS — Z888 Allergy status to other drugs, medicaments and biological substances status: Secondary | ICD-10-CM

## 2016-09-29 DIAGNOSIS — I509 Heart failure, unspecified: Secondary | ICD-10-CM | POA: Diagnosis present

## 2016-09-29 DIAGNOSIS — E43 Unspecified severe protein-calorie malnutrition: Secondary | ICD-10-CM | POA: Diagnosis present

## 2016-09-29 DIAGNOSIS — Z7952 Long term (current) use of systemic steroids: Secondary | ICD-10-CM

## 2016-09-29 DIAGNOSIS — Z87440 Personal history of urinary (tract) infections: Secondary | ICD-10-CM

## 2016-09-29 DIAGNOSIS — R600 Localized edema: Secondary | ICD-10-CM | POA: Diagnosis present

## 2016-09-29 DIAGNOSIS — Z79899 Other long term (current) drug therapy: Secondary | ICD-10-CM

## 2016-09-29 DIAGNOSIS — M7989 Other specified soft tissue disorders: Secondary | ICD-10-CM

## 2016-09-29 DIAGNOSIS — R531 Weakness: Secondary | ICD-10-CM

## 2016-09-29 DIAGNOSIS — Z7901 Long term (current) use of anticoagulants: Secondary | ICD-10-CM

## 2016-09-29 DIAGNOSIS — R2689 Other abnormalities of gait and mobility: Secondary | ICD-10-CM

## 2016-09-29 DIAGNOSIS — I4891 Unspecified atrial fibrillation: Secondary | ICD-10-CM | POA: Diagnosis present

## 2016-09-29 DIAGNOSIS — D649 Anemia, unspecified: Secondary | ICD-10-CM | POA: Diagnosis present

## 2016-09-29 DIAGNOSIS — E119 Type 2 diabetes mellitus without complications: Secondary | ICD-10-CM

## 2016-09-29 DIAGNOSIS — E869 Volume depletion, unspecified: Secondary | ICD-10-CM | POA: Diagnosis present

## 2016-09-29 DIAGNOSIS — T461X5A Adverse effect of calcium-channel blockers, initial encounter: Secondary | ICD-10-CM | POA: Diagnosis present

## 2016-09-29 DIAGNOSIS — L8990 Pressure ulcer of unspecified site, unspecified stage: Secondary | ICD-10-CM | POA: Insufficient documentation

## 2016-09-29 DIAGNOSIS — Z885 Allergy status to narcotic agent status: Secondary | ICD-10-CM

## 2016-09-29 DIAGNOSIS — J181 Lobar pneumonia, unspecified organism: Secondary | ICD-10-CM | POA: Diagnosis not present

## 2016-09-29 DIAGNOSIS — Z90710 Acquired absence of both cervix and uterus: Secondary | ICD-10-CM

## 2016-09-29 DIAGNOSIS — Z8249 Family history of ischemic heart disease and other diseases of the circulatory system: Secondary | ICD-10-CM

## 2016-09-29 DIAGNOSIS — E876 Hypokalemia: Secondary | ICD-10-CM | POA: Diagnosis present

## 2016-09-29 DIAGNOSIS — I130 Hypertensive heart and chronic kidney disease with heart failure and stage 1 through stage 4 chronic kidney disease, or unspecified chronic kidney disease: Secondary | ICD-10-CM | POA: Diagnosis present

## 2016-09-29 LAB — URINALYSIS, MICROSCOPIC (REFLEX)

## 2016-09-29 LAB — CBC WITH DIFFERENTIAL/PLATELET
BASOS ABS: 0 10*3/uL (ref 0.0–0.1)
BASOS PCT: 0 %
EOS ABS: 0.1 10*3/uL (ref 0.0–0.7)
Eosinophils Relative: 1 %
HCT: 31.7 % — ABNORMAL LOW (ref 36.0–46.0)
HEMOGLOBIN: 10.1 g/dL — AB (ref 12.0–15.0)
Lymphocytes Relative: 15 %
Lymphs Abs: 1.5 10*3/uL (ref 0.7–4.0)
MCH: 29.7 pg (ref 26.0–34.0)
MCHC: 31.9 g/dL (ref 30.0–36.0)
MCV: 93.2 fL (ref 78.0–100.0)
Monocytes Absolute: 0.7 10*3/uL (ref 0.1–1.0)
Monocytes Relative: 6 %
NEUTROS PCT: 78 %
Neutro Abs: 8.2 10*3/uL — ABNORMAL HIGH (ref 1.7–7.7)
Platelets: 192 10*3/uL (ref 150–400)
RBC: 3.4 MIL/uL — ABNORMAL LOW (ref 3.87–5.11)
RDW: 16.5 % — ABNORMAL HIGH (ref 11.5–15.5)
WBC: 10.6 10*3/uL — ABNORMAL HIGH (ref 4.0–10.5)

## 2016-09-29 LAB — COMPREHENSIVE METABOLIC PANEL
ALK PHOS: 132 U/L — AB (ref 38–126)
ALT: 16 U/L (ref 14–54)
ANION GAP: 8 (ref 5–15)
AST: 24 U/L (ref 15–41)
Albumin: 2.7 g/dL — ABNORMAL LOW (ref 3.5–5.0)
BUN: 40 mg/dL — ABNORMAL HIGH (ref 6–20)
CALCIUM: 8.4 mg/dL — AB (ref 8.9–10.3)
CO2: 27 mmol/L (ref 22–32)
Chloride: 104 mmol/L (ref 101–111)
Creatinine, Ser: 1.63 mg/dL — ABNORMAL HIGH (ref 0.44–1.00)
GFR calc Af Amer: 32 mL/min — ABNORMAL LOW (ref >60)
GFR calc non Af Amer: 27 mL/min — ABNORMAL LOW (ref >60)
GLUCOSE: 101 mg/dL — AB (ref 65–99)
Potassium: 4.4 mmol/L (ref 3.5–5.1)
SODIUM: 139 mmol/L (ref 135–145)
Total Bilirubin: 1.9 mg/dL — ABNORMAL HIGH (ref 0.3–1.2)
Total Protein: 6.1 g/dL — ABNORMAL LOW (ref 6.5–8.1)

## 2016-09-29 LAB — URINALYSIS, ROUTINE W REFLEX MICROSCOPIC
Bilirubin Urine: NEGATIVE
GLUCOSE, UA: NEGATIVE mg/dL
Ketones, ur: NEGATIVE mg/dL
Nitrite: POSITIVE — AB
PROTEIN: 100 mg/dL — AB
Specific Gravity, Urine: 1.02 (ref 1.005–1.030)
pH: 5 (ref 5.0–8.0)

## 2016-09-29 LAB — PROTIME-INR
INR: 4.77 — AB
Prothrombin Time: 46 seconds — ABNORMAL HIGH (ref 11.4–15.2)

## 2016-09-29 LAB — GLUCOSE, CAPILLARY: Glucose-Capillary: 84 mg/dL (ref 65–99)

## 2016-09-29 LAB — BRAIN NATRIURETIC PEPTIDE: B Natriuretic Peptide: 520 pg/mL — ABNORMAL HIGH (ref 0.0–100.0)

## 2016-09-29 MED ORDER — ADULT MULTIVITAMIN W/MINERALS CH
1.0000 | ORAL_TABLET | Freq: Every day | ORAL | Status: DC
  Administered 2016-09-30 – 2016-10-02 (×3): 1 via ORAL
  Filled 2016-09-29 (×3): qty 1

## 2016-09-29 MED ORDER — ALBUMIN HUMAN 25 % IV SOLN
INTRAVENOUS | Status: AC
  Filled 2016-09-29: qty 50

## 2016-09-29 MED ORDER — ACETAMINOPHEN 325 MG PO TABS: 650.0000 mg | ORAL_TABLET | Freq: Four times a day (QID) | ORAL | Status: DC | PRN

## 2016-09-29 MED ORDER — SODIUM CHLORIDE 0.9% FLUSH
3.0000 mL | INTRAVENOUS | Status: DC | PRN
Start: 2016-09-29 — End: 2016-10-02

## 2016-09-29 MED ORDER — ONE-DAILY MULTI VITAMINS PO TABS
1.0000 | ORAL_TABLET | Freq: Every day | ORAL | Status: DC
Start: 2016-09-30 — End: 2016-09-29

## 2016-09-29 MED ORDER — ACETAMINOPHEN 650 MG RE SUPP: 650.0000 mg | Freq: Four times a day (QID) | RECTAL | Status: DC | PRN

## 2016-09-29 MED ORDER — INSULIN ASPART 100 UNIT/ML ~~LOC~~ SOLN: 0.0000 [IU] | Freq: Every day | SUBCUTANEOUS | Status: DC

## 2016-09-29 MED ORDER — ATORVASTATIN CALCIUM 40 MG PO TABS
40.0000 mg | ORAL_TABLET | Freq: Every morning | ORAL | Status: DC
  Administered 2016-09-30 – 2016-10-02 (×3): 40 mg via ORAL
  Filled 2016-09-29 (×4): qty 1

## 2016-09-29 MED ORDER — LEVOFLOXACIN IN D5W 500 MG/100ML IV SOLN
500.0000 mg | INTRAVENOUS | Status: DC
  Administered 2016-10-01: 500 mg via INTRAVENOUS
  Filled 2016-09-29: qty 100

## 2016-09-29 MED ORDER — ONDANSETRON HCL 4 MG PO TABS: 4.0000 mg | ORAL_TABLET | Freq: Four times a day (QID) | ORAL | Status: DC | PRN

## 2016-09-29 MED ORDER — ONDANSETRON HCL 4 MG/2ML IJ SOLN: 4.0000 mg | Freq: Four times a day (QID) | INTRAMUSCULAR | Status: DC | PRN

## 2016-09-29 MED ORDER — TRAMADOL HCL 50 MG PO TABS
50.0000 mg | ORAL_TABLET | Freq: Four times a day (QID) | ORAL | Status: DC | PRN
  Administered 2016-09-29 – 2016-10-02 (×4): 50 mg via ORAL
  Filled 2016-09-29 (×4): qty 1

## 2016-09-29 MED ORDER — LEVOFLOXACIN IN D5W 500 MG/100ML IV SOLN
500.0000 mg | Freq: Once | INTRAVENOUS | Status: AC
  Administered 2016-09-29: 500 mg via INTRAVENOUS
  Filled 2016-09-29: qty 100

## 2016-09-29 MED ORDER — METOPROLOL TARTRATE 25 MG PO TABS
25.0000 mg | ORAL_TABLET | Freq: Two times a day (BID) | ORAL | Status: DC
Start: 2016-09-30 — End: 2016-10-02
  Administered 2016-09-30 – 2016-10-02 (×4): 25 mg via ORAL
  Filled 2016-09-29 (×5): qty 1

## 2016-09-29 MED ORDER — SODIUM CHLORIDE 0.9 % IV SOLN: 250.0000 mL | INTRAVENOUS | Status: DC | PRN

## 2016-09-29 MED ORDER — POLYETHYLENE GLYCOL 3350 17 G PO PACK: 17.0000 g | PACK | Freq: Every day | ORAL | Status: DC | PRN

## 2016-09-29 MED ORDER — PAROXETINE HCL 20 MG PO TABS
20.0000 mg | ORAL_TABLET | Freq: Every morning | ORAL | Status: DC
  Administered 2016-09-30 – 2016-10-02 (×3): 20 mg via ORAL
  Filled 2016-09-29 (×4): qty 1

## 2016-09-29 MED ORDER — SODIUM CHLORIDE 0.9% FLUSH
3.0000 mL | Freq: Two times a day (BID) | INTRAVENOUS | Status: DC
  Administered 2016-09-29 – 2016-10-02 (×6): 3 mL via INTRAVENOUS

## 2016-09-29 MED ORDER — ALBUMIN HUMAN 25 % IV SOLN
25.0000 g | Freq: Once | INTRAVENOUS | Status: AC
  Administered 2016-09-30: 25 g via INTRAVENOUS
  Filled 2016-09-29: qty 100

## 2016-09-29 MED ORDER — FERROUS SULFATE 325 (65 FE) MG PO TABS
325.0000 mg | ORAL_TABLET | Freq: Every evening | ORAL | Status: DC
  Administered 2016-09-29 – 2016-10-02 (×4): 325 mg via ORAL
  Filled 2016-09-29 (×5): qty 1

## 2016-09-29 MED ORDER — INSULIN ASPART 100 UNIT/ML ~~LOC~~ SOLN
0.0000 [IU] | Freq: Three times a day (TID) | SUBCUTANEOUS | Status: DC
  Administered 2016-09-30 – 2016-10-02 (×5): 1 [IU] via SUBCUTANEOUS

## 2016-09-29 NOTE — ED Notes (Signed)
CRITICAL VALUE ALERT  Critical value received:  INR 4.77  Date of notification:  09/29/2016  Time of notification:  1701  Critical value read back:Yes.    Nurse who received alert:  Tarri Glennrystal Estanislado Surgeon RN  MD notified (1st page):  Dr Estell HarpinZammit  Time of first page:  1701  MD notified (2nd page):  Time of second page:  Responding MD:  Dr Estell HarpinZammit   Time MD responded:  [phone]

## 2016-09-29 NOTE — ED Triage Notes (Signed)
Pt has been getting weaker within the last 2 weeks.  Bilateral lower extremity edema and weeping.  Red rash under left breast, underneath panus and in groin.  Pt unable to ambulate, can transfer to bed with 2 person moderate assistance.

## 2016-09-29 NOTE — H&P (Signed)
History and Physical    Glenda Dickson JYN:[id number] DOB: 07/15/1928 DOA: 09/29/2016  PCP: [name], MD   Patient coming from: Home  Chief Complaint: Gen weakness, leg swelling, SOB, cough  HPI: Glenda Dickson is a 83 y.o. female with medical history significant for chronic atrial fibrillation on Coumadin, chronic normocytic anemia, hypertension, type 2 diabetes mellitus, and chronic kidney disease stage III-IV who presents to the emergency department with generalized weakness, loss of appetite, leg swelling, cough, and dyspnea. Patient reports that over the past 2 weeks she has developed progressive generalized weakness with increased bilateral lower extremity edema, cough, and now dyspnea. She denies any fevers or chills and denies orthopnea or PND. She denies any significant weight gain recently. She was recently treated for UTI and may have improved slightly with treatment, but the dyspnea has become more severe. She denies recent long distance travel or sick contacts and denies any recent fall or trauma.   ED Course: Upon arrival to the ED, patient is found to be afebrile, saturating adequately on room air, and with vital signs stable. Chest x-ray features left lower lobe pneumonia with associated pleural effusion, as well as stable cardiomegaly without edema. Chemistry panel is notable for a BUN of 40 and serum creatinine of 1.63 which is consistent with her baseline. Chemistries are also notable for an albumin of 2.7, down from 3.8 one year ago. CBC features a mild leukocytosis to 10,600 and a stable normocytic anemia with hemoglobin 10.1. BNP is elevated to a value of 520 and INR is supratherapeutic at 4.77. Patient was treated with empiric Levaquin for suspected community-acquired pneumonia. Given her age and comorbidity, she will be observed in the hospital for initiation of treatment.     Review of Systems:  All other systems reviewed and apart from HPI, are negative.  Past Medical  History:  Diagnosis Date  . Anemia, normocytic normochromic 06/26/2012   H&H-11.2/33.7, normal MCV in 06/2012   . Atrial fibrillation (HCC)   . Depression   . Diabetes mellitus type II    no insulin  . Fracture of left hip (HCC) 2006   intertrochanteric; ORIF  . Hyperlipidemia   . Hypertension     Past Surgical History:  Procedure Laterality Date  . ABDOMINAL HYSTERECTOMY    . COLONOSCOPY  2003   Negative screening study.  Drucilla Chalet HIP FRACTURE SURGERY     Fixation     reports that she has never smoked. She has never used smokeless tobacco. She reports that she does not drink alcohol or use drugs.  Allergies  Allergen Reactions  . Benzalkonium Chloride-Alcohol Other (See Comments)    Patient/family is not familiar with this allergy  . Codeine Itching and Nausea Only    Family History  Problem Relation Age of Onset  . Hypertension Mother   . Heart failure Mother   . Heart attack Father   . Hypertension Father   . Hypertension Sister     Atrial Fib  . Hypertension Brother     4/6 brothers with htn     Prior to Admission medications   Medication Sig Start Date End Date Taking? Authorizing Provider  amLODipine (NORVASC) 10 MG tablet Take 10 mg by mouth every morning.    Yes Historical Provider, MD  atorvastatin (LIPITOR) 40 MG tablet Take 40 mg by mouth every morning.    Yes Historical Provider, MD  Calcium Citrate-Vitamin D (CITRACAL + D PO) Take 1 tablet by mouth daily. D3  Yes Historical Provider, MD  ferrous sulfate 325 (65 FE) MG tablet Take 325 mg by mouth every evening.  06/29/16  Yes Historical Provider, MD  furosemide (LASIX) 20 MG tablet Take 1 tablet (20 mg total) by mouth daily. Patient taking differently: Take 40 mg by mouth daily.  10/30/15  Yes Laqueta LindenSuresh A Koneswaran, MD  glipiZIDE (GLUCOTROL XL) 2.5 MG 24 hr tablet Take 2.5 mg by mouth every evening.    Yes Historical Provider, MD  metoprolol (LOPRESSOR) 50 MG tablet Take 1 tablet (50 mg total)  by mouth 2 (two) times daily. 11/27/15  Yes Laqueta LindenSuresh A Koneswaran, MD  Multiple Vitamin (MULTIVITAMIN) tablet Take 1 tablet by mouth daily.   Yes Historical Provider, MD  PARoxetine (PAXIL) 20 MG tablet Take 20 mg by mouth every morning.    Yes Historical Provider, MD  potassium chloride (K-DUR,KLOR-CON) 10 MEQ tablet Take 10 mEq by mouth daily. 05/28/16  Yes Historical Provider, MD  ramipril (ALTACE) 5 MG capsule TAKE 1 CAPSULE BY MOUTH DAILY Patient taking differently: TAKE 1 CAPSULE BY MOUTH DAILY IN THE EVENING 09/24/16  Yes Laqueta LindenSuresh A Koneswaran, MD  warfarin (COUMADIN) 2.5 MG tablet Take 1 tablet daily except 1 1/2 tablets on Mondays, Wednesdays and Fridays Patient taking differently: Take 2.5-3.75 mg by mouth every evening. Take 1 tablet daily except 1 1/2 tablets on Mondays, Wednesdays and Fridays 09/09/16  Yes Antoine PocheJonathan F Branch, MD  cephALEXin (KEFLEX) 250 MG capsule Take 250 mg by mouth 2 (two) times daily. 7 day course starting on 09/20/2016 09/20/16   Historical Provider, MD  fluconazole (DIFLUCAN) 150 MG tablet Take 150 mg by mouth daily. 20177 day course starting on 12/29/ 09/20/16   Historical Provider, MD  predniSONE (DELTASONE) 20 MG tablet Take 1 tablet (20 mg total) by mouth 2 (two) times daily. Patient not taking: Reported on 09/29/2016 07/17/16   Mancel BaleElliott Wentz, MD    Physical Exam: Vitals:   09/29/16 1557 09/29/16 1558 09/29/16 1701 09/29/16 1820  BP: 118/76  113/61 104/80  Pulse: 99  93 87  Resp: 20  16 20   Temp: 98.8 F (37.1 C)     TempSrc: Oral     SpO2: 98%  98% 92%  Weight:  79.4 kg (175 lb)    Height:  5\' 6"  (1.676 m)        Constitutional: Dyspneic with full sentences; no pallor, comfortable while at rest Eyes: PERTLA, lids and conjunctivae normal ENMT: Mucous membranes are dry. Posterior pharynx clear of any exudate or lesions.   Neck: normal, supple, no masses, no thyromegaly Respiratory: Rhonchi at right base. Increased WOB. No cyanosis. No wheezes.     Cardiovascular: Rate ~80 and irregular with grade 3 murmur at apex. Pitting edema to bilateral LEs extending into thighs, Rt > Lt. No significant JVD. Abdomen: No distension, no tenderness, no masses palpated. Bowel sounds normal.  Musculoskeletal: no clubbing / cyanosis. No joint deformity upper and lower extremities. Normal muscle tone.  Skin: Superficial abrasions to distal RLE. BLEs weeping serous fluid. Warm, dry, well-perfused. Neurologic: CN 2-12 grossly intact. Sensation intact, DTR normal. Strength 5/5 in all 4 limbs.  Psychiatric: Normal judgment and insight. Alert and oriented x 3. Normal mood and affect.     Labs on Admission: I have personally reviewed following labs and imaging studies  CBC:  Recent Labs Lab 09/29/16 1632  WBC 10.6*  NEUTROABS 8.2*  HGB 10.1*  HCT 31.7*  MCV 93.2  PLT 192   Basic Metabolic Panel:  Recent Labs Lab 09/29/16 1632  NA 139  K 4.4  CL 104  CO2 27  GLUCOSE 101*  BUN 40*  CREATININE 1.63*  CALCIUM 8.4*   GFR: Estimated Creatinine Clearance: 26.3 mL/min (by C-G formula based on SCr of 1.63 mg/dL (H)). Liver Function Tests:  Recent Labs Lab 09/29/16 1632  AST 24  ALT 16  ALKPHOS 132*  BILITOT 1.9*  PROT 6.1*  ALBUMIN 2.7*   No results for input(s): LIPASE, AMYLASE in the last 168 hours. No results for input(s): AMMONIA in the last 168 hours. Coagulation Profile:  Recent Labs Lab 09/29/16 1632  INR 4.77*   Cardiac Enzymes: No results for input(s): CKTOTAL, CKMB, CKMBINDEX, TROPONINI in the last 168 hours. BNP (last 3 results) No results for input(s): PROBNP in the last 8760 hours. HbA1C: No results for input(s): HGBA1C in the last 72 hours. CBG: No results for input(s): GLUCAP in the last 168 hours. Lipid Profile: No results for input(s): CHOL, HDL, LDLCALC, TRIG, CHOLHDL, LDLDIRECT in the last 72 hours. Thyroid Function Tests: No results for input(s): TSH, T4TOTAL, FREET4, T3FREE, THYROIDAB in the last 72  hours. Anemia Panel: No results for input(s): VITAMINB12, FOLATE, FERRITIN, TIBC, IRON, RETICCTPCT in the last 72 hours. Urine analysis:    Component Value Date/Time   COLORURINE BROWN (A) 09/29/2016 1756   APPEARANCEUR CLOUDY (A) 09/29/2016 1756   LABSPEC 1.020 09/29/2016 1756   PHURINE 5.0 09/29/2016 1756   GLUCOSEU NEGATIVE 09/29/2016 1756   HGBUR LARGE (A) 09/29/2016 1756   BILIRUBINUR NEGATIVE 09/29/2016 1756   KETONESUR NEGATIVE 09/29/2016 1756   PROTEINUR 100 (A) 09/29/2016 1756   NITRITE POSITIVE (A) 09/29/2016 1756   LEUKOCYTESUR TRACE (A) 09/29/2016 1756   Sepsis Labs: @LABRCNTIP (procalcitonin:4,lacticidven:4) )No results found for this or any previous visit (from the past 240 hour(s)).   Radiological Exams on Admission: Dg Chest Port 1 View  Result Date: 09/29/2016 CLINICAL DATA:  83 year old with progressive generalized weakness over the past 2 weeks associated with bilateral lower extremity weeping edema. EXAM: PORTABLE CHEST 1 VIEW COMPARISON:  None. FINDINGS: Cardiac silhouette mildly enlarged for the AP portable technique, unchanged. Thoracic aorta atherosclerotic, unchanged. Hilar and mediastinal contours otherwise unremarkable. Dense consolidation in the left lower lobe associated with a left pleural effusion. Lungs otherwise clear. No visible right pleural effusion. Pulmonary vascularity normal. Severe degenerative changes involving the right shoulder joint with dense calcifications involving the capsule and/or the subacromial bursa. IMPRESSION: 1. Left lower lobe pneumonia and associated left pleural effusion. 2. Stable mild cardiomegaly without evidence of pulmonary edema. 3. Thoracic aortic atherosclerosis. Electronically Signed   By: Hulan Saas M.D.   On: 09/29/2016 17:13    EKG: Not performed, will obtain as appropriate.   Assessment/Plan  1. CAP  - Pt has LLL pneumonia with small parapneumonic effusion  - She was started on empiric Levaquin in the ED,  plan to continue this pending cultures    2. Protein-calorie malnutrition  - Serum albumin 2.7 on admission, down from 3.8 one year ago  - This likely contributes to her BLE edema  - Dietary consultation requested    3. Atrial fibrillation, chronic  - She is in rate-controlled a fib on admission  - CHADS-VASc is 57 (age x2, gender, CHF, HTN, DM)  - She is anticoagulated with warfarin, currently held d/t supratherapeutic INR  - Continue metoprolol as tolerated   4. Chronic diastolic CHF  - On admission, pt appears intravascularly depleted, but with marked BLE edema and elevated  BNP  - Follow I/Os, daily wts - SLIV for now, but if BP remains soft, will likely treat with albumin  - Ramipril held on admission given intravascular volume depletion and concern for precipitating AKI   5. CKD stage III-IV - SCr is 1.63 on admission which appears consistent with her baseline  - Ramipril held on admission; avoid dehydration; renally-dose medications    6. Leg swelling, bilateral - Marked BLE edema with serous weeping  - Likely multifactorial with poor nutrition/hypoalbuminemia, advanced CKD, and diastolic CHF - Underlying DVT unlikely with supratherapeutic INR on admission  - Keep legs elevated   7. Hypertension  - BP soft on admission  - Ramipril and Norvasc held, will continue Lopressor as tolerated  8. Type II DM  - No A1c on file - Managed with glipizide at home; this is held on admission  - Check CBG with meals and qHS - Start a low-intensity SSI correctional and adjust prn    DVT prophylaxis: warfarin  Code Status: Full  Family Communication: Children updated at bedside Disposition Plan: Observe on med-surg Consults called: None Admission status: Observation    Briscoe Deutscher, MD [facility] Pager [phone]  If 7PM-7AM, please contact night-coverage [email or web address] Password [email or web address]  09/29/2016, 7:08 PM

## 2016-09-29 NOTE — Progress Notes (Signed)
Pharmacy Antibiotic Note  Glenda Dickson is a 83 y.o. female admitted on 09/29/2016 with pneumonia.  Pharmacy has been consulted for levaquin dosing. Initial dose of 500 mg given in ED  Plan: Cont levaquin 500 mg IV q48 hours F/u renal function, cultures and clinical course  Height: 5\' 6"  (167.6 cm) Weight: 175 lb (79.4 kg) IBW/kg (Calculated) : 59.3  Temp (24hrs), Avg:98.8 F (37.1 C), Min:98.8 F (37.1 C), Max:98.8 F (37.1 C)   Recent Labs Lab 09/29/16 1632  WBC 10.6*  CREATININE 1.63*    Estimated Creatinine Clearance: 26.3 mL/min (by C-G formula based on SCr of 1.63 mg/dL (H)).    Allergies  Allergen Reactions  . Benzalkonium Chloride-Alcohol Other (See Comments)    Patient/family is not familiar with this allergy  . Codeine Itching and Nausea Only    Antimicrobials this admission: levaquin 1/7 >>   Thank you for allowing pharmacy to be a part of this patient's care.  Woodfin GanjaSeay, Glenda Dickson 09/29/2016 7:16 PM

## 2016-09-29 NOTE — ED Provider Notes (Signed)
[facility]-EMERGENCY DEPT Provider Note   CSN: [id number] Arrival date & time: 09/29/16  1538     History   Chief Complaint Chief Complaint  Patient presents with  . Weakness    HPI Glenda Dickson is a 83 y.o. female.  Patient complains of weakness she cannot stand swelling in her legs   The history is provided by the patient. No language interpreter was used.  Weakness  Primary symptoms include no focal weakness. This is a new problem. The current episode started 12 to 24 hours ago. The problem has not changed since onset.Pertinent negatives include no chest pain and no headaches.    Past Medical History:  Diagnosis Date  . Anemia, normocytic normochromic 06/26/2012   H&H-11.2/33.7, normal MCV in 06/2012   . Atrial fibrillation (HCC)   . Depression   . Diabetes mellitus type II    no insulin  . Fracture of left hip (HCC) 2006   intertrochanteric; ORIF  . Hyperlipidemia   . Hypertension     Patient Active Problem List   Diagnosis Date Noted  . Encounter for therapeutic drug monitoring 11/06/2015  . Chronic kidney disease 08/24/2012  . Cerebrovascular disease 07/20/2012  . Anemia, normocytic normochromic 06/26/2012  . Hypertension   . Diabetes mellitus type II, non insulin dependent (HCC)   . Hyperlipidemia   . Atrial fibrillation [facility])     Past Surgical History:  Procedure Laterality Date  . ABDOMINAL HYSTERECTOMY    . COLONOSCOPY  2003   Negative screening study.  Drucilla [facility]et HIP FRACTURE SURGERY     Fixation    OB History    No data available       Home Medications    Prior to Admission medications   Medication Sig Start Date End Date Taking? Authorizing Provider  amLODipine (NORVASC) 10 MG tablet Take 10 mg by mouth every morning.    Yes Historical Provider, MD  atorvastatin (LIPITOR) 40 MG tablet Take 40 mg by mouth every morning.    Yes Historical Provider, MD  Calcium Citrate-Vitamin D (CITRACAL + D PO) Take 1 tablet by mouth daily. D3    Yes Historical Provider, MD  ferrous sulfate 325 (65 FE) MG tablet Take 325 mg by mouth every evening.  06/29/16  Yes Historical Provider, MD  furosemide (LASIX) 20 MG tablet Take 1 tablet (20 mg total) by mouth daily. Patient taking differently: Take 40 mg by mouth daily.  10/30/15  Yes Laqueta Linden, MD  glipiZIDE (GLUCOTROL XL) 2.5 MG 24 hr tablet Take 2.5 mg by mouth every evening.    Yes Historical Provider, MD  metoprolol (LOPRESSOR) 50 MG tablet Take 1 tablet (50 mg total) by mouth 2 (two) times daily. 11/27/15  Yes Laqueta Linden, MD  Multiple Vitamin (MULTIVITAMIN) tablet Take 1 tablet by mouth daily.   Yes Historical Provider, MD  PARoxetine (PAXIL) 20 MG tablet Take 20 mg by mouth every morning.    Yes Historical Provider, MD  potassium chloride (K-DUR,KLOR-CON) 10 MEQ tablet Take 10 mEq by mouth daily. 05/28/16  Yes Historical Provider, MD  ramipril (ALTACE) 5 MG capsule TAKE 1 CAPSULE BY MOUTH DAILY Patient taking differently: TAKE 1 CAPSULE BY MOUTH DAILY IN THE EVENING 09/24/16  Yes Laqueta Linden, MD  warfarin (COUMADIN) 2.5 MG tablet Take 1 tablet daily except 1 1/2 tablets on Mondays, Wednesdays and Fridays Patient taking differently: Take 2.5-3.75 mg by mouth every evening. Take 1 tablet daily except 1 1/2 tablets on Mondays, Wednesdays  and Fridays 09/09/16  Yes Antoine PocheJonathan F Branch, MD  cephALEXin (KEFLEX) 250 MG capsule Take 250 mg by mouth 2 (two) times daily. 7 day course starting on 09/20/2016 09/20/16   Historical Provider, MD  fluconazole (DIFLUCAN) 150 MG tablet Take 150 mg by mouth daily. 20177 day course starting on 12/29/ 09/20/16   Historical Provider, MD  predniSONE (DELTASONE) 20 MG tablet Take 1 tablet (20 mg total) by mouth 2 (two) times daily. Patient not taking: Reported on 09/29/2016 07/17/16   Mancel BaleElliott Wentz, MD    Family History Family History  Problem Relation Age of Onset  . Hypertension Mother   . Heart failure Mother   . Heart attack Father   .  Hypertension Father   . Hypertension Sister     Atrial Fib  . Hypertension Brother     4/6 brothers with htn    Social History Social History  Substance Use Topics  . Smoking status: Never Smoker  . Smokeless tobacco: Never Used  . Alcohol use No     Allergies   Benzalkonium chloride-alcohol and Codeine   Review of Systems Review of Systems  Constitutional: Negative for appetite change and fatigue.  HENT: Negative for congestion, ear discharge and sinus pressure.   Eyes: Negative for discharge.  Respiratory: Negative for cough.   Cardiovascular: Negative for chest pain.  Gastrointestinal: Negative for abdominal pain and diarrhea.  Genitourinary: Negative for frequency and hematuria.  Musculoskeletal: Negative for back pain.  Skin: Negative for rash.  Neurological: Positive for weakness. Negative for focal weakness, seizures and headaches.  Psychiatric/Behavioral: Negative for hallucinations.     Physical Exam Updated Vital Signs BP 118/76 (BP Location: Right Arm)   Pulse 99   Temp 98.8 F (37.1 C) (Oral)   Resp 20   Ht 5\' 6"  (1.676 m)   Wt 175 lb (79.4 kg)   SpO2 98%   BMI 28.25 kg/m   Physical Exam  Constitutional: She is oriented to person, place, and time. She appears well-developed.  HENT:  Head: Normocephalic.  Eyes: Conjunctivae and EOM are normal. No scleral icterus.  Neck: Neck supple. No thyromegaly present.  Cardiovascular: Normal rate and regular rhythm.  Exam reveals no gallop and no friction rub.   No murmur heard. Pulmonary/Chest: No stridor. She has no wheezes. She has no rales. She exhibits no tenderness.  Abdominal: She exhibits no distension. There is no tenderness. There is no rebound.  Musculoskeletal: Normal range of motion. She exhibits edema.  Lymphadenopathy:    She has no cervical adenopathy.  Neurological: She is oriented to person, place, and time. She exhibits normal muscle tone. Coordination normal.  Skin: No rash noted. No  erythema.  Psychiatric: She has a normal mood and affect. Her behavior is normal.     ED Treatments / Results  Labs (all labs ordered are listed, but only abnormal results are displayed) Labs Reviewed  CBC WITH DIFFERENTIAL/PLATELET - Abnormal; Notable for the following:       Result Value   WBC 10.6 (*)    RBC 3.40 (*)    Hemoglobin 10.1 (*)    HCT 31.7 (*)    RDW 16.5 (*)    Neutro Abs 8.2 (*)    All other components within normal limits  COMPREHENSIVE METABOLIC PANEL - Abnormal; Notable for the following:    Glucose, Bld 101 (*)    BUN 40 (*)    Creatinine, Ser 1.63 (*)    Calcium 8.4 (*)    Total  Protein 6.1 (*)    Albumin 2.7 (*)    Alkaline Phosphatase 132 (*)    Total Bilirubin 1.9 (*)    GFR calc non Af Amer 27 (*)    GFR calc Af Amer 32 (*)    All other components within normal limits  BRAIN NATRIURETIC PEPTIDE - Abnormal; Notable for the following:    B Natriuretic Peptide 520.0 (*)    All other components within normal limits  PROTIME-INR - Abnormal; Notable for the following:    Prothrombin Time 46.0 (*)    INR 4.77 (*)    All other components within normal limits  URINALYSIS, ROUTINE W REFLEX MICROSCOPIC    EKG  EKG Interpretation None       Radiology Dg Chest Port 1 View  Result Date: 09/29/2016 CLINICAL DATA:  83 year old with progressive generalized weakness over the past 2 weeks associated with bilateral lower extremity weeping edema. EXAM: PORTABLE CHEST 1 VIEW COMPARISON:  None. FINDINGS: Cardiac silhouette mildly enlarged for the AP portable technique, unchanged. Thoracic aorta atherosclerotic, unchanged. Hilar and mediastinal contours otherwise unremarkable. Dense consolidation in the left lower lobe associated with a left pleural effusion. Lungs otherwise clear. No visible right pleural effusion. Pulmonary vascularity normal. Severe degenerative changes involving the right shoulder joint with dense calcifications involving the capsule and/or the  subacromial bursa. IMPRESSION: 1. Left lower lobe pneumonia and associated left pleural effusion. 2. Stable mild cardiomegaly without evidence of pulmonary edema. 3. Thoracic aortic atherosclerosis. Electronically Signed   By: Hulan Saas M.D.   On: 09/29/2016 17:13    Procedures Procedures (including critical care time)  Medications Ordered in ED Medications  levofloxacin (LEVAQUIN) IVPB 500 mg (not administered)     Initial Impression / Assessment and Plan / ED Course  I have reviewed the triage vital signs and the nursing notes.  Pertinent labs & imaging results that were available during my care of the patient were reviewed by me and considered in my medical decision making (see chart for details).  Clinical Course     Chest x-ray shows some pneumonia and fluid left side. She will be admitted for pneumonia  Final Clinical Impressions(s) / ED Diagnoses   Final diagnoses:  Community acquired pneumonia of right lower lobe of lung [facility])    New Prescriptions New Prescriptions   No medications on file     Bethann Berkshire, MD 09/29/16 4540

## 2016-09-30 DIAGNOSIS — Z90710 Acquired absence of both cervix and uterus: Secondary | ICD-10-CM | POA: Diagnosis not present

## 2016-09-30 DIAGNOSIS — N184 Chronic kidney disease, stage 4 (severe): Secondary | ICD-10-CM | POA: Diagnosis not present

## 2016-09-30 DIAGNOSIS — E43 Unspecified severe protein-calorie malnutrition: Secondary | ICD-10-CM | POA: Diagnosis present

## 2016-09-30 DIAGNOSIS — D649 Anemia, unspecified: Secondary | ICD-10-CM | POA: Diagnosis present

## 2016-09-30 DIAGNOSIS — R600 Localized edema: Secondary | ICD-10-CM | POA: Diagnosis present

## 2016-09-30 DIAGNOSIS — Z7952 Long term (current) use of systemic steroids: Secondary | ICD-10-CM | POA: Diagnosis not present

## 2016-09-30 DIAGNOSIS — E869 Volume depletion, unspecified: Secondary | ICD-10-CM | POA: Diagnosis present

## 2016-09-30 DIAGNOSIS — E876 Hypokalemia: Secondary | ICD-10-CM | POA: Diagnosis present

## 2016-09-30 DIAGNOSIS — I481 Persistent atrial fibrillation: Secondary | ICD-10-CM | POA: Diagnosis not present

## 2016-09-30 DIAGNOSIS — J189 Pneumonia, unspecified organism: Secondary | ICD-10-CM | POA: Diagnosis present

## 2016-09-30 DIAGNOSIS — Z888 Allergy status to other drugs, medicaments and biological substances status: Secondary | ICD-10-CM | POA: Diagnosis not present

## 2016-09-30 DIAGNOSIS — Z885 Allergy status to narcotic agent status: Secondary | ICD-10-CM | POA: Diagnosis not present

## 2016-09-30 DIAGNOSIS — Z87440 Personal history of urinary (tract) infections: Secondary | ICD-10-CM | POA: Diagnosis not present

## 2016-09-30 DIAGNOSIS — I509 Heart failure, unspecified: Secondary | ICD-10-CM | POA: Diagnosis not present

## 2016-09-30 DIAGNOSIS — I482 Chronic atrial fibrillation: Secondary | ICD-10-CM | POA: Diagnosis present

## 2016-09-30 DIAGNOSIS — I5032 Chronic diastolic (congestive) heart failure: Secondary | ICD-10-CM | POA: Diagnosis not present

## 2016-09-30 DIAGNOSIS — E1122 Type 2 diabetes mellitus with diabetic chronic kidney disease: Secondary | ICD-10-CM | POA: Diagnosis present

## 2016-09-30 DIAGNOSIS — L8990 Pressure ulcer of unspecified site, unspecified stage: Secondary | ICD-10-CM | POA: Insufficient documentation

## 2016-09-30 DIAGNOSIS — T461X5A Adverse effect of calcium-channel blockers, initial encounter: Secondary | ICD-10-CM | POA: Diagnosis present

## 2016-09-30 DIAGNOSIS — Z79899 Other long term (current) drug therapy: Secondary | ICD-10-CM | POA: Diagnosis not present

## 2016-09-30 DIAGNOSIS — Z7901 Long term (current) use of anticoagulants: Secondary | ICD-10-CM | POA: Diagnosis not present

## 2016-09-30 DIAGNOSIS — R531 Weakness: Secondary | ICD-10-CM | POA: Diagnosis not present

## 2016-09-30 DIAGNOSIS — M7989 Other specified soft tissue disorders: Secondary | ICD-10-CM | POA: Diagnosis not present

## 2016-09-30 DIAGNOSIS — J181 Lobar pneumonia, unspecified organism: Secondary | ICD-10-CM | POA: Diagnosis not present

## 2016-09-30 DIAGNOSIS — Z7984 Long term (current) use of oral hypoglycemic drugs: Secondary | ICD-10-CM | POA: Diagnosis not present

## 2016-09-30 DIAGNOSIS — E785 Hyperlipidemia, unspecified: Secondary | ICD-10-CM | POA: Diagnosis present

## 2016-09-30 DIAGNOSIS — I130 Hypertensive heart and chronic kidney disease with heart failure and stage 1 through stage 4 chronic kidney disease, or unspecified chronic kidney disease: Secondary | ICD-10-CM | POA: Diagnosis present

## 2016-09-30 DIAGNOSIS — Z8249 Family history of ischemic heart disease and other diseases of the circulatory system: Secondary | ICD-10-CM | POA: Diagnosis not present

## 2016-09-30 LAB — COMPREHENSIVE METABOLIC PANEL
ALK PHOS: 98 U/L (ref 38–126)
ALT: 14 U/L (ref 14–54)
AST: 21 U/L (ref 15–41)
Albumin: 2.5 g/dL — ABNORMAL LOW (ref 3.5–5.0)
Anion gap: 9 (ref 5–15)
BUN: 39 mg/dL — AB (ref 6–20)
CALCIUM: 8 mg/dL — AB (ref 8.9–10.3)
CHLORIDE: 104 mmol/L (ref 101–111)
CO2: 26 mmol/L (ref 22–32)
CREATININE: 1.6 mg/dL — AB (ref 0.44–1.00)
GFR calc non Af Amer: 28 mL/min — ABNORMAL LOW (ref >60)
GFR, EST AFRICAN AMERICAN: 33 mL/min — AB (ref >60)
Glucose, Bld: 106 mg/dL — ABNORMAL HIGH (ref 65–99)
Potassium: 3.1 mmol/L — ABNORMAL LOW (ref 3.5–5.1)
Sodium: 139 mmol/L (ref 135–145)
Total Bilirubin: 1.1 mg/dL (ref 0.3–1.2)
Total Protein: 5.1 g/dL — ABNORMAL LOW (ref 6.5–8.1)

## 2016-09-30 LAB — PROTIME-INR
INR: 5.35 — AB
PROTHROMBIN TIME: 50.5 s — AB (ref 11.4–15.2)

## 2016-09-30 LAB — GLUCOSE, CAPILLARY
Glucose-Capillary: 91 mg/dL (ref 65–99)
Glucose-Capillary: 108 mg/dL — ABNORMAL HIGH (ref 65–99)
Glucose-Capillary: 146 mg/dL — ABNORMAL HIGH (ref 65–99)
Glucose-Capillary: 150 mg/dL — ABNORMAL HIGH (ref 65–99)

## 2016-09-30 MED ORDER — NYSTATIN 100000 UNIT/GM EX POWD: Freq: Three times a day (TID) | CUTANEOUS | Status: DC

## 2016-09-30 MED ORDER — FUROSEMIDE 10 MG/ML IJ SOLN
40.0000 mg | Freq: Two times a day (BID) | INTRAMUSCULAR | Status: DC
  Administered 2016-09-30 – 2016-10-01 (×3): 40 mg via INTRAVENOUS
  Filled 2016-09-30 (×3): qty 4

## 2016-09-30 MED ORDER — WARFARIN - PHARMACIST DOSING INPATIENT
Status: DC
  Administered 2016-09-30: 16:00:00

## 2016-09-30 MED ORDER — NYSTATIN 100000 UNIT/GM EX POWD
Freq: Three times a day (TID) | CUTANEOUS | Status: DC | PRN
  Administered 2016-09-30: 06:00:00 via TOPICAL
  Filled 2016-09-30: qty 15

## 2016-09-30 NOTE — Progress Notes (Signed)
Family and patient educated on the importance of relieving pressure from sacral area. Educated on skin breakdown and proper hygiene. Educated family on the purpose of Albumin. Dressings applied to edematous BLE. Dressing changed at 0545 as they were saturated. Wound care consulted. Nystatin powder applied within folds in reddened, broken down areas. Sacral foam dressing applied to Stage II on sacrum. Repositioned patient PRN. Continue to monitor. MD notified of INR and pT critical lab values.

## 2016-09-30 NOTE — Progress Notes (Addendum)
Initial Nutrition Assessment   Pt meets criteria for severe MALNUTRITION in the context of chronic illness as evidenced by energy intake </= 75% for >/= 1 months and weight loss >10% in 6 months.   INTERVENTION:  Ensure Enlive po BID, each supplement provides 350 kcal and 20 grams of protein   NUTRITION DIAGNOSIS:    Inadequate oral intake related to appetite (waxs and wanes) as evidenced by pt/family report and wt loss over the past 6 months    GOAL: Pt to meet >/= 90% of their estimated nutrition needs      MONITOR: Po intake, labs, skin assessments and wt trends     REASON FOR ASSESSMENT:   Malnutrition Screening Tool, Consult Assessment of nutrition requirement/status  ASSESSMENT: Glenda Dickson is a 83 y.o. female with medical history significant for chronic atrial fibrillation on Coumadin, chronic normocytic anemia, hypertension, type 2 diabetes mellitus, and chronic kidney disease stage III-IV who presents to the emergency department with generalized weakness, loss of appetite, leg swelling, cough, and dyspnea. Patient reports that over the past 2 weeks she has developed progressive generalized weakness with increased bilateral lower extremity edema, cough, and now dyspnea. She denies any fevers or chills and denies orthopnea or PND. She denies any significant weight gain recently. She was recently treated for UTI and may have improved slightly with treatment, but the dyspnea has become more severe.    Patient and son provided hx. The pt says her appetite comes and goes but the son is shaking his head no and says that she has not been eating well for the past year. In particular son says she has quit eating meat. We talked about other options for her such as adding boiled eggs, greek yogurt, cottage cheese and/ or protein modular or nutrition shake such as Ensure. Also talked to her about her goal intake for protein. Based on weight hx her weight has decreased 14% in the last 6 mo.  This was unintentional and is significant for the timeframe.   Recent Labs Lab 09/29/16 1632 09/30/16 0513  NA 139 139  K 4.4 3.1*  CL 104 104  CO2 27 26  BUN 40* 39*  CREATININE 1.63* 1.60*  CALCIUM 8.4* 8.0*  GLUCOSE 101* 106*  Labs: potassium 3.1, cr 1.6 BUN 39  Meds: iron , MVI, warfarin, insulin   Nutrition-Focused physical exam deferred at this time.  Diet Order:  Diet regular Room service appropriate? Yes; Fluid consistency: Thin  Skin:   stage II , lower legs are wrapped due to weeping- edema bilateral feet /ankles  Last BM:  09/28/16   Height:   Ht Readings from Last 1 Encounters:  09/29/16 5\' 6"  (1.676 m)    Weight:   Wt Readings from Last 1 Encounters:  09/30/16 175 lb 14.8 oz (79.8 kg)    Ideal Body Weight:  59 kg  BMI:  Body mass index is 28.4 kg/m.  Estimated Nutritional Needs:   Kcal:  1595-1710  Protein:  63-66 kg  Fluid:  1.5 liters  EDUCATION NEEDS:     Royann ShiversLynn Deaven Barron MS,RD,CSG,LDN Office: [phone] Pager: [phone]

## 2016-09-30 NOTE — Consult Note (Signed)
WOC Nurse wound consult note Reason for Consult: Moisture associated skin damage (intertriginous dermatitis) to skin folds.  Stage 2 pressure injury to sacrum, POA Wound type:Moisture and pressure Pressure Injury POA: Yes Measurement:Generalized erythema under breasts, abdominal pannus and perineal skin folds. Present on admission.  Stage 2 pressure injury to sacrum :  2 cm x 1 cm x 0.1 cm  Wound bed pink  And moist Drainage (amount, consistency, odor) Scant serous weeping Periwound:intact Dressing procedure/placement/frequency:Cleanse skin folds with soap and water and pat gently dry.  Apply interdry Ag moisture wicking fabric.Hart [place] # [phone])    Will discontinue Nystatin.  Measure and cut length of InterDry Ag+ to fit in skin folds that have skin breakdown Tuck InterDry  Ag+ fabric into skin folds in a single layer, allow for 2 inches of overhang from skin edges to allow for wicking to occur May remove to bathe; dry area thoroughly and then tuck into affected areas again Do not apply any creams or ointments when using InterDry Ag+ DO NOT THROW AWAY FOR 5 DAYS unless soiled with stool DO NOT [facility] product, this will inactivate the silver in the material  New sheet of Interdry Ag+ should be applied after 5 days of use if patient continues to have skin breakdown  Silicone border foam to sacrum per protocol.  Change every 3 days and PRN soilage.  Will not follow at this time.  Please re-consult if needed.  Maple HudsonKaren Bellarae Lizer RN BSN CWON Pager [phone]

## 2016-09-30 NOTE — Progress Notes (Signed)
ANTICOAGULATION CONSULT NOTE - Initial Consult  Pharmacy Consult for coumadin Indication: atrial fibrillation  Allergies  Allergen Reactions  . Benzalkonium Chloride-Alcohol Other (See Comments)    Patient/family is not familiar with this allergy  . Codeine Itching and Nausea Only    Patient Measurements: Height: 5\' 6"  (167.6 cm) Weight: 175 lb 14.8 oz (79.8 kg) IBW/kg (Calculated) : 59.3   Vital Signs: Temp: 98.4 F (36.9 C) (01/08 0636) Temp Source: Oral (01/08 0636) BP: 114/53 (01/08 0636) Pulse Rate: 77 (01/08 0636)  Labs:  Recent Labs  09/29/16 1632 09/30/16 0513  HGB 10.1*  --   HCT 31.7*  --   PLT 192  --   LABPROT 46.0* 50.5*  INR 4.77* 5.35*  CREATININE 1.63* 1.60*    Estimated Creatinine Clearance: 26.9 mL/min (by C-G formula based on SCr of 1.6 mg/dL (H)).   Medical History: Past Medical History:  Diagnosis Date  . Anemia, normocytic normochromic 06/26/2012   H&H-11.2/33.7, normal MCV in 06/2012   . Atrial fibrillation (HCC)   . Depression   . Diabetes mellitus type II    no insulin  . Fracture of left hip (HCC) 2006   intertrochanteric; ORIF  . Hyperlipidemia   . Hypertension     Medications:  Prescriptions Prior to Admission  Medication Sig Dispense Refill Last Dose  . amLODipine (NORVASC) 10 MG tablet Take 10 mg by mouth every morning.    09/29/2016 at Unknown time  . atorvastatin (LIPITOR) 40 MG tablet Take 40 mg by mouth every morning.    09/29/2016 at Unknown time  . Calcium Citrate-Vitamin D (CITRACAL + D PO) Take 1 tablet by mouth daily. D3   UNKNOWN  . ferrous sulfate 325 (65 FE) MG tablet Take 325 mg by mouth every evening.   5 09/28/2016 at Unknown time  . furosemide (LASIX) 20 MG tablet Take 1 tablet (20 mg total) by mouth daily. (Patient taking differently: Take 40 mg by mouth daily. ) 90 tablet 3 09/29/2016 at Unknown time  . glipiZIDE (GLUCOTROL XL) 2.5 MG 24 hr tablet Take 2.5 mg by mouth every evening.    09/28/2016 at Unknown time  .  metoprolol (LOPRESSOR) 50 MG tablet Take 1 tablet (50 mg total) by mouth 2 (two) times daily. 180 tablet 3 09/29/2016 at 1100a  . Multiple Vitamin (MULTIVITAMIN) tablet Take 1 tablet by mouth daily.   Past Month at Unknown time  . PARoxetine (PAXIL) 20 MG tablet Take 20 mg by mouth every morning.    09/29/2016 at Unknown time  . potassium chloride (K-DUR,KLOR-CON) 10 MEQ tablet Take 10 mEq by mouth daily.  2 09/29/2016 at Unknown time  . ramipril (ALTACE) 5 MG capsule TAKE 1 CAPSULE BY MOUTH DAILY (Patient taking differently: TAKE 1 CAPSULE BY MOUTH DAILY IN THE EVENING) 90 capsule 0 09/28/2016 at Unknown time  . warfarin (COUMADIN) 2.5 MG tablet Take 1 tablet daily except 1 1/2 tablets on Mondays, Wednesdays and Fridays (Patient taking differently: Take 2.5-3.75 mg by mouth every evening. Take 1 tablet daily except 1 1/2 tablets on Mondays, Wednesdays and Fridays) 30 tablet 0 09/28/2016 at 2130  . cephALEXin (KEFLEX) 250 MG capsule Take 250 mg by mouth 2 (two) times daily. 7 day course starting on 09/20/2016  0 Completed Course at Unknown time  . fluconazole (DIFLUCAN) 150 MG tablet Take 150 mg by mouth daily. 20177 day course starting on 12/29/  0 Completed Course at Unknown time  . predniSONE (DELTASONE) 20 MG tablet Take 1  tablet (20 mg total) by mouth 2 (two) times daily. (Patient not taking: Reported on 09/29/2016) 10 tablet 0 Not Taking at Unknown time    Assessment: 83 yo lady admitted with weakness to continue coumadin for afib.  INR is elevated Goal of Therapy:  INR 2-3 Monitor platelets by anticoagulation protocol: Yes   Plan:  Hold coumadin for now F/u daily PT/INR Monitor for bleeding complications  Cammie Faulstich Poteet 09/30/2016,8:44 AM

## 2016-09-30 NOTE — Progress Notes (Signed)
PROGRESS NOTE    Glenda Dickson  WJX:[id number] DOB: 1928/06/10 DOA: 09/29/2016 PCP: [name], MD   Brief Narrative: 83 y.o. female with medical history significant for chronic atrial fibrillation on Coumadin, chronic normocytic anemia, hypertension, type 2 diabetes mellitus, and chronic kidney disease stage III-IV who presents to the emergency department with generalized weakness, loss of appetite, leg swelling, cough, and dyspnea. Patient reports that over the past 2 weeks she has developed progressive generalized weakness with increased bilateral lower extremity edema, cough, and now dyspnea. She denies any fevers or chills and denies orthopnea or PND. She denies any significant weight gain recently. She was recently treated for UTI and may have improved slightly with treatment, but the dyspnea has become more severe. She denies recent long distance travel or sick contacts and denies any recent fall or trauma. On antibiotics for community acquired pneumonia.  Assessment & Plan:   Principal Problem:   CAP (community acquired pneumonia) Active Problems:   Hypertension   Diabetes mellitus type II, non insulin dependent (HCC)   Atrial fibrillation (HCC)   Anemia, normocytic normochromic   Chronic kidney disease (CKD), stage IV (severe) (HCC)   Supratherapeutic INR   Chronic diastolic CHF (congestive heart failure) (HCC)   Protein calorie malnutrition (HCC)   Leg swelling   Pressure injury of skin  #Community acquired pneumonia: Chest x-ray consistent with left lower lobe pneumonia and a small parapneumonic effusion. -I will check influenza and respiratory viral studies. -Continue Levaquin. Follow up culture results.  #Chronic atrial fibrillation: On systemic anticoagulation with Coumadin. Currently has  supratherapeutic INR. Monitor PT/INR. Continue metoprolol.  #Chronic diastolic congestive heart failure: -Chest x-ray with no pulmonary edema. Patient has cardiomegaly. -I will check  echocardiogram. -Patient has bilateral lower extremity edema and mild elevation in BNP, unknown if she has acute on chronic CHF. I will start IV Lasix twice a day and monitor renal function. -ramipril on held.   #Hypertension: Blood pressure borderline low. On metoprolol for rate control. Start Lasix for edema. Monitor blood pressure closely.  #Chronic kidney disease is stage IV: Serum creatinine level around baseline. Monitor BMP. Replete potassium chloride oral for hypokalemia. Avoid nephrotoxins.  #Severe protein calorie malnutrition: Dietary referral. Oral supplement.  #Physical deconditioning likely due to ongoing medical condition and comorbidities: PT, OT evaluation. Continue supportive care.  DVT prophylaxis: Systemic and I coagulation Code Status: Full code Family Communication: Discussed with the patient's son at bedside. Disposition Plan: Likely discharge home with home care versus rehabilitation in 1-2 days.    Consultants:   None  Procedures: None Antimicrobials: Levaquin  Subjective: Patient was seen and examined at bedside. Patient reported generalized weakness, poor oral intake and lower extremity edema. Denied fever, chest pain, shortness of breath, nausea or vomiting.   Objective: Vitals:   09/30/16 0500 09/30/16 0636 09/30/16 0926 09/30/16 1514  BP:  (!) 114/53 (!) 97/46 (!) 117/59  Pulse:  77 65 92  Resp:  18  18  Temp:  98.4 F (36.9 C)  98.6 F (37 C)  TempSrc:  Oral    SpO2:  95%  97%  Weight: 79.8 kg (175 lb 14.8 oz)     Height:        Intake/Output Summary (Last 24 hours) at 09/30/16 1620 Last data filed at 09/30/16 0900  Gross per 24 hour  Intake              223 ml  Output  0 ml  Net              223 ml   Filed Weights   09/29/16 1558 09/30/16 0500  Weight: 79.4 kg (175 lb) 79.8 kg (175 lb 14.8 oz)    Examination:  General exam: Pleasant elderly female lying in bed comfortable, not in distress  Respiratory system:  Clear to auscultation. Respiratory effort normal. No wheezing or crackle Cardiovascular system: S1 & S2 heard, RRR.  Gastrointestinal system: Abdomen is nondistended, soft and nontender. Normal bowel sounds heard. Central nervous system: Alert and oriented. No focal neurological deficits. Extremities: Bilateral lower extremity wrapped with gauze. Skin: Lower extremities has gauze therefore unable to assess. Psychiatry: Judgement and insight appear normal. Mood & affect appropriate.     Data Reviewed: I have personally reviewed following labs and imaging studies  CBC:  Recent Labs Lab 09/29/16 1632  WBC 10.6*  NEUTROABS 8.2*  HGB 10.1*  HCT 31.7*  MCV 93.2  PLT 192   Basic Metabolic Panel:  Recent Labs Lab 09/29/16 1632 09/30/16 0513  NA 139 139  K 4.4 3.1*  CL 104 104  CO2 27 26  GLUCOSE 101* 106*  BUN 40* 39*  CREATININE 1.63* 1.60*  CALCIUM 8.4* 8.0*   GFR: Estimated Creatinine Clearance: 26.9 mL/min (by C-G formula based on SCr of 1.6 mg/dL (H)). Liver Function Tests:  Recent Labs Lab 09/29/16 1632 09/30/16 0513  AST 24 21  ALT 16 14  ALKPHOS 132* 98  BILITOT 1.9* 1.1  PROT 6.1* 5.1*  ALBUMIN 2.7* 2.5*   No results for input(s): LIPASE, AMYLASE in the last 168 hours. No results for input(s): AMMONIA in the last 168 hours. Coagulation Profile:  Recent Labs Lab 09/29/16 1632 09/30/16 0513  INR 4.77* 5.35*   Cardiac Enzymes: No results for input(s): CKTOTAL, CKMB, CKMBINDEX, TROPONINI in the last 168 hours. BNP (last 3 results) No results for input(s): PROBNP in the last 8760 hours. HbA1C: No results for input(s): HGBA1C in the last 72 hours. CBG:  Recent Labs Lab 09/29/16 2050 09/30/16 0801 09/30/16 1127  GLUCAP 84 91 108*   Lipid Profile: No results for input(s): CHOL, HDL, LDLCALC, TRIG, CHOLHDL, LDLDIRECT in the last 72 hours. Thyroid Function Tests: No results for input(s): TSH, T4TOTAL, FREET4, T3FREE, THYROIDAB in the last 72  hours. Anemia Panel: No results for input(s): VITAMINB12, FOLATE, FERRITIN, TIBC, IRON, RETICCTPCT in the last 72 hours. Sepsis Labs: No results for input(s): PROCALCITON, LATICACIDVEN in the last 168 hours.  No results found for this or any previous visit (from the past 240 hour(s)).       Radiology Studies: Dg Chest Port 1 View  Result Date: 09/29/2016 CLINICAL DATA:  83 year old with progressive generalized weakness over the past 2 weeks associated with bilateral lower extremity weeping edema. EXAM: PORTABLE CHEST 1 VIEW COMPARISON:  None. FINDINGS: Cardiac silhouette mildly enlarged for the AP portable technique, unchanged. Thoracic aorta atherosclerotic, unchanged. Hilar and mediastinal contours otherwise unremarkable. Dense consolidation in the left lower lobe associated with a left pleural effusion. Lungs otherwise clear. No visible right pleural effusion. Pulmonary vascularity normal. Severe degenerative changes involving the right shoulder joint with dense calcifications involving the capsule and/or the subacromial bursa. IMPRESSION: 1. Left lower lobe pneumonia and associated left pleural effusion. 2. Stable mild cardiomegaly without evidence of pulmonary edema. 3. Thoracic aortic atherosclerosis. Electronically Signed   By: Hulan Saas M.D.   On: 09/29/2016 17:13        Scheduled Meds: .  atorvastatin  40 mg Oral q morning - 10a  . ferrous sulfate  325 mg Oral QPM  . insulin aspart  0-5 Units Subcutaneous QHS  . insulin aspart  0-9 Units Subcutaneous TID WC  . [START ON 10/01/2016] levofloxacin (LEVAQUIN) IV  500 mg Intravenous Q48H  . metoprolol tartrate  25 mg Oral BID  . multivitamin with minerals  1 tablet Oral Daily  . PARoxetine  20 mg Oral q morning - 10a  . sodium chloride flush  3 mL Intravenous Q12H  . Warfarin - Pharmacist Dosing Inpatient   Does not apply Q24H   Continuous Infusions:   LOS: 0 days    Kylil Swopes Jaynie CollinsPrasad Styles Fambro, MD [facility] Pager  [phone]  If 7PM-7AM, please contact night-coverage [email or web address] Password [email or web address] 09/30/2016, 4:20 PM

## 2016-10-01 ENCOUNTER — Inpatient Hospital Stay (HOSPITAL_COMMUNITY): Payer: Medicare Other

## 2016-10-01 ENCOUNTER — Encounter (HOSPITAL_COMMUNITY): Payer: Self-pay | Admitting: *Deleted

## 2016-10-01 DIAGNOSIS — I509 Heart failure, unspecified: Secondary | ICD-10-CM

## 2016-10-01 LAB — ECHOCARDIOGRAM COMPLETE
AVLVOTPG: 3 mmHg
CHL CUP DOP CALC LVOT VTI: 20.7 cm
CHL CUP MV DEC (S): 271
EWDT: 271 ms
FS: 30 % (ref 28–44)
Height: 66 in
IV/PV OW: 0.97
LA diam end sys: 37 mm
LA vol A4C: 97.7 ml
LA vol: 97.6 mL
LADIAMINDEX: 1.93 cm/m2
LASIZE: 37 mm
LAVOLIN: 50.8 mL/m2
LDCA: 2.54 cm2
LV PW d: 9.75 mm — AB (ref 0.6–1.1)
LV SIMPSON'S DISK: 59
LV dias vol index: 22 mL/m2
LV dias vol: 43 mL — AB (ref 46–106)
LV sys vol index: 9 mL/m2
LV sys vol: 18 mL
LVOT SV: 53 mL
LVOT peak vel: 92.5 cm/s
LVOTD: 18 mm
MV pk E vel: 139 m/s
MVPG: 8 mmHg
RV sys press: 49 mmHg
Reg peak vel: 292 cm/s
Stroke v: 25 ml
TAPSE: 13.5 mm
TR max vel: 292 cm/s
Weight: 2899.49 oz

## 2016-10-01 LAB — BASIC METABOLIC PANEL
ANION GAP: 7 (ref 5–15)
BUN: 39 mg/dL — ABNORMAL HIGH (ref 6–20)
CO2: 27 mmol/L (ref 22–32)
CREATININE: 1.57 mg/dL — AB (ref 0.44–1.00)
Calcium: 7.9 mg/dL — ABNORMAL LOW (ref 8.9–10.3)
Chloride: 105 mmol/L (ref 101–111)
GFR calc non Af Amer: 29 mL/min — ABNORMAL LOW (ref >60)
GFR, EST AFRICAN AMERICAN: 33 mL/min — AB (ref >60)
Glucose, Bld: 120 mg/dL — ABNORMAL HIGH (ref 65–99)
Potassium: 3.3 mmol/L — ABNORMAL LOW (ref 3.5–5.1)
Sodium: 139 mmol/L (ref 135–145)

## 2016-10-01 LAB — GLUCOSE, CAPILLARY
GLUCOSE-CAPILLARY: 100 mg/dL — AB (ref 65–99)
GLUCOSE-CAPILLARY: 143 mg/dL — AB (ref 65–99)
GLUCOSE-CAPILLARY: 110 mg/dL — AB (ref 65–99)
Glucose-Capillary: 172 mg/dL — ABNORMAL HIGH (ref 65–99)

## 2016-10-01 LAB — RESPIRATORY PANEL BY PCR
ADENOVIRUS-RVPPCR: NOT DETECTED
Bordetella pertussis: NOT DETECTED
CORONAVIRUS NL63-RVPPCR: NOT DETECTED
CORONAVIRUS OC43-RVPPCR: NOT DETECTED
Chlamydophila pneumoniae: NOT DETECTED
Coronavirus 229E: NOT DETECTED
Coronavirus HKU1: NOT DETECTED
INFLUENZA A-RVPPCR: NOT DETECTED
Influenza B: NOT DETECTED
Metapneumovirus: NOT DETECTED
Mycoplasma pneumoniae: NOT DETECTED
PARAINFLUENZA VIRUS 1-RVPPCR: NOT DETECTED
PARAINFLUENZA VIRUS 2-RVPPCR: NOT DETECTED
PARAINFLUENZA VIRUS 4-RVPPCR: NOT DETECTED
Parainfluenza Virus 3: NOT DETECTED
RHINOVIRUS / ENTEROVIRUS - RVPPCR: NOT DETECTED
Respiratory Syncytial Virus: NOT DETECTED

## 2016-10-01 LAB — URINE CULTURE: CULTURE: NO GROWTH

## 2016-10-01 LAB — PROTIME-INR
INR: 3.95
Prothrombin Time: 39.6 seconds — ABNORMAL HIGH (ref 11.4–15.2)

## 2016-10-01 LAB — HEMOGLOBIN A1C
HEMOGLOBIN A1C: 5.8 % — AB (ref 4.8–5.6)
MEAN PLASMA GLUCOSE: 120 mg/dL

## 2016-10-01 LAB — MAGNESIUM: MAGNESIUM: 1.5 mg/dL — AB (ref 1.7–2.4)

## 2016-10-01 MED ORDER — INFLUENZA VAC SPLIT QUAD 0.5 ML IM SUSY: 0.5000 mL | PREFILLED_SYRINGE | INTRAMUSCULAR | Status: DC

## 2016-10-01 MED ORDER — FUROSEMIDE 40 MG PO TABS
40.0000 mg | ORAL_TABLET | Freq: Every day | ORAL | Status: DC
  Administered 2016-10-02: 40 mg via ORAL
  Filled 2016-10-01: qty 1

## 2016-10-01 NOTE — Care Management Note (Signed)
Case Management Note  Patient Details  Name: Glenda SowSarah T Dickson MRN: [id number] Date of Birth: [birth year]  Subjective/Objective:                  Pt admitted with CAP. She is from home, lives alone but has strong family support and assistance in her home most of the day. [facility]. Pt and family unsure name of HH agency. Call made to referring MD office and CM awaiting return call. Pt/family plan on pt returning home at DC [facility]   Action/Plan: Will cont to follow for DC planning.   Expected Discharge Date:    10/02/2016              Expected Discharge Plan:  Home w Home Health Services  In-House Referral:  NA  Discharge planning Services  CM Consult  Post Acute Care Choice:  Home Health Choice offered to:  Patient  HH Arranged:  [facility]     Status of Service:  In process, will continue to follow Malcolm MetroChildress, Samary Shatz Demske, RN 10/01/2016, 9:14 AM

## 2016-10-01 NOTE — Progress Notes (Signed)
*  PRELIMINARY RESULTS* Echocardiogram 2D Echocardiogram has been performed.  Stacey DrainWhite, Josiah Wojtaszek J 10/01/2016, 4:08 PM

## 2016-10-01 NOTE — Progress Notes (Signed)
PROGRESS NOTE    Glenda Dickson  WUJ:[id number] DOB: October 04, 1927 DOA: 09/29/2016 PCP: [name], MD   Brief Narrative: 83 y.o. female with medical history significant for chronic atrial fibrillation on Coumadin, chronic normocytic anemia, hypertension, type 2 diabetes mellitus, and chronic kidney disease stage III-IV who presents to the emergency department with generalized weakness, loss of appetite, leg swelling, cough, and dyspnea. Patient reports that over the past 2 weeks she has developed progressive generalized weakness with increased bilateral lower extremity edema, cough, and now dyspnea. She denies any fevers or chills and denies orthopnea or PND. She denies any significant weight gain recently. She was recently treated for UTI and may have improved slightly with treatment, but the dyspnea has become more severe. She denies recent long distance travel or sick contacts and denies any recent fall or trauma. On antibiotics for community acquired pneumonia.  Assessment & Plan:   Principal Problem:   Community acquired pneumonia of right lower lobe of lung (HCC) Active Problems:   Hypertension   Diabetes mellitus type II, non insulin dependent (HCC)   Atrial fibrillation (HCC)   Anemia, normocytic normochromic   Chronic kidney disease (CKD), stage IV (severe) (HCC)   Supratherapeutic INR   Chronic diastolic CHF (congestive heart failure) (HCC)   Protein calorie malnutrition (HCC)   Leg swelling   Pressure injury of skin   CAP (community acquired pneumonia)  #Community acquired pneumonia: Chest x-ray consistent with left lower lobe pneumonia and a small parapneumonic effusion. -influenza and respiratory viral studies negative -Continue Levaquin. Follow up culture results. -Clinically improving. Patient is not hypoxic  #Chronic atrial fibrillation: On systemic anticoagulation with Coumadin. Still has supratherapeutic INR. Continue to hold Coumadin. Monitor PT/INR. Continue  metoprolol.  #Chronic diastolic congestive heart failure: -Chest x-ray with no pulmonary edema. Patient has cardiomegaly. -Echocardiogram with normal ejection fraction, unable to assess diastolic function. -Treated with IV Lasix for lower extremity edema, switch to oral Lasix starting tomorrow. Continue to monitor. Reinforced compliance with the Lasix at home and low salt diet. -ramipril on held.   #Hypertension: On metoprolol for rate control. Start Lasix for edema. Monitor blood pressure closely.  #Chronic kidney disease is stage IV: Serum creatinine level around baseline. Monitor BMP. Replete potassium chloride. Avoid nephrotoxins.   #Severe protein calorie malnutrition: Dietary referral. Oral supplement.  #Physical deconditioning likely due to ongoing medical condition and comorbidities: Discussed with the patient and her son at bedside. Patient is not able to ambulate recently. She wants to try to see if she can walk. PT and OT evaluation ordered. Depending on PT evaluation, patient may benefit from short stay at rehabilitation.  # Stage 2 pressure injury to sacrum, POA: Wound care and dressing change.  DVT prophylaxis: Systemic anticoagulation Code Status: Full code Family Communication: Discussed with the patient's son at bedside. Disposition Plan: Likely discharge home with home care versus rehabilitation in 1-2 days.    Consultants:   None  Procedures: None Antimicrobials: Levaquin  Subjective: Patient was seen and examined at bedside. Patient reported feeling better but is still has weakness and try to see if she can get out of the bed. Denied chest pain, shortness of breath, nausea vomiting. Patient's son at bedside.  Objective: Vitals:   09/30/16 1514 09/30/16 2222 10/01/16 0402 10/01/16 0544  BP: (!) 117/59 108/83  (!) 103/53  Pulse: 92 100  73  Resp: 18 18  18   Temp: 98.6 F (37 C) 98.3 F (36.8 C)  98.2 F (36.8 C)  TempSrc:  Oral  Oral  SpO2: 97% 96%  98%    Weight:   82.2 kg (181 lb 3.5 oz)   Height:        Intake/Output Summary (Last 24 hours) at 10/01/16 1700 Last data filed at 10/01/16 0900  Gross per 24 hour  Intake              243 ml  Output                0 ml  Net              243 ml   Filed Weights   09/29/16 1558 09/30/16 0500 10/01/16 0402  Weight: 79.4 kg (175 lb) 79.8 kg (175 lb 14.8 oz) 82.2 kg (181 lb 3.5 oz)    Examination:  General exam: Pleasant female lying on bed comfortable, not in distress  Respiratory system: Clear bilateral, respiratory effort normal. No wheezing Cardiovascular system: Regular rate rhythm, S1-S2 normal. Gastrointestinal system: Abdomen is nondistended, soft and nontender. Normal bowel sounds heard. Central nervous system: Alert, awake, oriented. No focal neurological deficit. Extremities: Bilateral lower extremity wrapped with gauze. Skin: Lower extremities has gauze therefore unable to assess. Psychiatry: Judgement and insight appear normal. Mood & affect appropriate.     Data Reviewed: I have personally reviewed following labs and imaging studies  CBC:  Recent Labs Lab 09/29/16 1632  WBC 10.6*  NEUTROABS 8.2*  HGB 10.1*  HCT 31.7*  MCV 93.2  PLT 192   Basic Metabolic Panel:  Recent Labs Lab 09/29/16 1632 09/30/16 0513 10/01/16 0624  NA 139 139 139  K 4.4 3.1* 3.3*  CL 104 104 105  CO2 27 26 27   GLUCOSE 101* 106* 120*  BUN 40* 39* 39*  CREATININE 1.63* 1.60* 1.57*  CALCIUM 8.4* 8.0* 7.9*  MG  --   --  1.5*   GFR: Estimated Creatinine Clearance: 27.8 mL/min (by C-G formula based on SCr of 1.57 mg/dL (H)). Liver Function Tests:  Recent Labs Lab 09/29/16 1632 09/30/16 0513  AST 24 21  ALT 16 14  ALKPHOS 132* 98  BILITOT 1.9* 1.1  PROT 6.1* 5.1*  ALBUMIN 2.7* 2.5*   No results for input(s): LIPASE, AMYLASE in the last 168 hours. No results for input(s): AMMONIA in the last 168 hours. Coagulation Profile:  Recent Labs Lab 09/29/16 1632 09/30/16 0513  10/01/16 0624  INR 4.77* 5.35* 3.95   Cardiac Enzymes: No results for input(s): CKTOTAL, CKMB, CKMBINDEX, TROPONINI in the last 168 hours. BNP (last 3 results) No results for input(s): PROBNP in the last 8760 hours. HbA1C:  Recent Labs  09/29/16 1632  HGBA1C 5.8*   CBG:  Recent Labs Lab 09/30/16 1713 09/30/16 2134 10/01/16 0818 10/01/16 1144 10/01/16 1631  GLUCAP 146* 150* 100* 143* 110*   Lipid Profile: No results for input(s): CHOL, HDL, LDLCALC, TRIG, CHOLHDL, LDLDIRECT in the last 72 hours. Thyroid Function Tests: No results for input(s): TSH, T4TOTAL, FREET4, T3FREE, THYROIDAB in the last 72 hours. Anemia Panel: No results for input(s): VITAMINB12, FOLATE, FERRITIN, TIBC, IRON, RETICCTPCT in the last 72 hours. Sepsis Labs: No results for input(s): PROCALCITON, LATICACIDVEN in the last 168 hours.  Recent Results (from the past 240 hour(s))  Urine culture     Status: None   Collection Time: 09/29/16  5:57 PM  Result Value Ref Range Status   Specimen Description URINE, CLEAN CATCH  Final   Special Requests NONE  Final   Culture NO GROWTH [facility]formed at  [facility]   Final   Report Status 10/01/2016 FINAL  Final  Respiratory Panel by PCR     Status: None   Collection Time: 09/30/16  4:27 PM  Result Value Ref Range Status   Adenovirus NOT DETECTED NOT DETECTED Final   Coronavirus 229E NOT DETECTED NOT DETECTED Final   Coronavirus HKU1 NOT DETECTED NOT DETECTED Final   Coronavirus NL63 NOT DETECTED NOT DETECTED Final   Coronavirus OC43 NOT DETECTED NOT DETECTED Final   Metapneumovirus NOT DETECTED NOT DETECTED Final   Rhinovirus / Enterovirus NOT DETECTED NOT DETECTED Final   Influenza A NOT DETECTED NOT DETECTED Final   Influenza B NOT DETECTED NOT DETECTED Final   Parainfluenza Virus 1 NOT DETECTED NOT DETECTED Final   Parainfluenza Virus 2 NOT DETECTED NOT DETECTED Final   Parainfluenza Virus 3 NOT DETECTED NOT DETECTED Final   Parainfluenza Virus 4  NOT DETECTED NOT DETECTED Final   Respiratory Syncytial Virus NOT DETECTED NOT DETECTED Final   Bordetella pertussis NOT DETECTED NOT DETECTED Final   Chlamydophila pneumoniae NOT DETECTED NOT DETECTED Final   Mycoplasma pneumoniae NOT DETECTED NOT DETECTED Final    Comment: [facility]         Radiology Studies: Dg Chest Port 1 View  Result Date: 09/29/2016 CLINICAL DATA:  83 year old with progressive generalized weakness over the past 2 weeks associated with bilateral lower extremity weeping edema. EXAM: PORTABLE CHEST 1 VIEW COMPARISON:  None. FINDINGS: Cardiac silhouette mildly enlarged for the AP portable technique, unchanged. Thoracic aorta atherosclerotic, unchanged. Hilar and mediastinal contours otherwise unremarkable. Dense consolidation in the left lower lobe associated with a left pleural effusion. Lungs otherwise clear. No visible right pleural effusion. Pulmonary vascularity normal. Severe degenerative changes involving the right shoulder joint with dense calcifications involving the capsule and/or the subacromial bursa. IMPRESSION: 1. Left lower lobe pneumonia and associated left pleural effusion. 2. Stable mild cardiomegaly without evidence of pulmonary edema. 3. Thoracic aortic atherosclerosis. Electronically Signed   By: Hulan Saashomas  Lawrence M.D.   On: 09/29/2016 17:13        Scheduled Meds: . atorvastatin  40 mg Oral q morning - 10a  . ferrous sulfate  325 mg Oral QPM  . [START ON 10/02/2016] furosemide  40 mg Oral Daily  . insulin aspart  0-5 Units Subcutaneous QHS  . insulin aspart  0-9 Units Subcutaneous TID WC  . levofloxacin (LEVAQUIN) IV  500 mg Intravenous Q48H  . metoprolol tartrate  25 mg Oral BID  . multivitamin with minerals  1 tablet Oral Daily  . PARoxetine  20 mg Oral q morning - 10a  . sodium chloride flush  3 mL Intravenous Q12H  . Warfarin - Pharmacist Dosing Inpatient   Does not apply Q24H   Continuous Infusions:   LOS: 1 day     Bradie Lacock Jaynie CollinsPrasad Sharnette Kitamura, MD [facility] Pager [phone]  If 7PM-7AM, please contact night-coverage [email or web address] Password [email or web address] 10/01/2016, 5:00 PM

## 2016-10-01 NOTE — Progress Notes (Signed)
ANTICOAGULATION CONSULT NOTE - follow up  Pharmacy Consult for coumadin Indication: atrial fibrillation  Allergies  Allergen Reactions  . Benzalkonium Chloride-Alcohol Other (See Comments)    Patient/family is not familiar with this allergy  . Codeine Itching and Nausea Only   Patient Measurements: Height: 5\' 6"  (167.6 cm) Weight: 181 lb 3.5 oz (82.2 kg) IBW/kg (Calculated) : 59.3  Vital Signs: Temp: 98.2 F (36.8 C) (01/09 0544) Temp Source: Oral (01/09 0544) BP: 103/53 (01/09 0544) Pulse Rate: 73 (01/09 0544)  Labs:  Recent Labs  09/29/16 1632 09/30/16 0513 10/01/16 0624  HGB 10.1*  --   --   HCT 31.7*  --   --   PLT 192  --   --   LABPROT 46.0* 50.5* 39.6*  INR 4.77* 5.35* 3.95  CREATININE 1.63* 1.60* 1.57*   Estimated Creatinine Clearance: 27.8 mL/min (by C-G formula based on SCr of 1.57 mg/dL (H)).  Medical History: Past Medical History:  Diagnosis Date  . Anemia, normocytic normochromic 06/26/2012   H&H-11.2/33.7, normal MCV in 06/2012   . Atrial fibrillation (HCC)   . Depression   . Diabetes mellitus type II    no insulin  . Fracture of left hip (HCC) 2006   intertrochanteric; ORIF  . Hyperlipidemia   . Hypertension    Medications:  Prescriptions Prior to Admission  Medication Sig Dispense Refill Last Dose  . amLODipine (NORVASC) 10 MG tablet Take 10 mg by mouth every morning.    09/29/2016 at Unknown time  . atorvastatin (LIPITOR) 40 MG tablet Take 40 mg by mouth every morning.    09/29/2016 at Unknown time  . Calcium Citrate-Vitamin D (CITRACAL + D PO) Take 1 tablet by mouth daily. D3   UNKNOWN  . ferrous sulfate 325 (65 FE) MG tablet Take 325 mg by mouth every evening.   5 09/28/2016 at Unknown time  . furosemide (LASIX) 20 MG tablet Take 1 tablet (20 mg total) by mouth daily. (Patient taking differently: Take 40 mg by mouth daily. ) 90 tablet 3 09/29/2016 at Unknown time  . glipiZIDE (GLUCOTROL XL) 2.5 MG 24 hr tablet Take 2.5 mg by mouth every evening.     09/28/2016 at Unknown time  . metoprolol (LOPRESSOR) 50 MG tablet Take 1 tablet (50 mg total) by mouth 2 (two) times daily. 180 tablet 3 09/29/2016 at 1100a  . Multiple Vitamin (MULTIVITAMIN) tablet Take 1 tablet by mouth daily.   Past Month at Unknown time  . PARoxetine (PAXIL) 20 MG tablet Take 20 mg by mouth every morning.    09/29/2016 at Unknown time  . potassium chloride (K-DUR,KLOR-CON) 10 MEQ tablet Take 10 mEq by mouth daily.  2 09/29/2016 at Unknown time  . ramipril (ALTACE) 5 MG capsule TAKE 1 CAPSULE BY MOUTH DAILY (Patient taking differently: TAKE 1 CAPSULE BY MOUTH DAILY IN THE EVENING) 90 capsule 0 09/28/2016 at Unknown time  . warfarin (COUMADIN) 2.5 MG tablet Take 1 tablet daily except 1 1/2 tablets on Mondays, Wednesdays and Fridays (Patient taking differently: Take 2.5-3.75 mg by mouth every evening. Take 1 tablet daily except 1 1/2 tablets on Mondays, Wednesdays and Fridays) 30 tablet 0 09/28/2016 at 2130  . cephALEXin (KEFLEX) 250 MG capsule Take 250 mg by mouth 2 (two) times daily. 7 day course starting on 09/20/2016  0 Completed Course at Unknown time  . fluconazole (DIFLUCAN) 150 MG tablet Take 150 mg by mouth daily. 20177 day course starting on 12/29/  0 Completed Course at Unknown time  .  predniSONE (DELTASONE) 20 MG tablet Take 1 tablet (20 mg total) by mouth 2 (two) times daily. (Patient not taking: Reported on 09/29/2016) 10 tablet 0 Not Taking at Unknown time   Assessment: 83 yo lady admitted with weakness to continue coumadin for afib.  INR is elevated  Goal of Therapy:  INR 2-3 Monitor platelets by anticoagulation protocol: Yes   Plan:  Hold coumadin for now F/u daily PT/INR Monitor for bleeding complications  Valrie HartHall, Laney Bagshaw A 10/01/2016,8:36 AM

## 2016-10-02 ENCOUNTER — Inpatient Hospital Stay
Admission: RE | Admit: 2016-10-02 | Discharge: 2016-10-23 | Disposition: A | Discharge status: Nursing Home (Includes ICF) | Payer: Medicare Other | Source: Ambulatory Visit | Attending: Internal Medicine | Admitting: Internal Medicine

## 2016-10-02 DIAGNOSIS — J181 Lobar pneumonia, unspecified organism: Secondary | ICD-10-CM

## 2016-10-02 LAB — BASIC METABOLIC PANEL
Anion gap: 7 (ref 5–15)
BUN: 41 mg/dL — ABNORMAL HIGH (ref 6–20)
CALCIUM: 7.9 mg/dL — AB (ref 8.9–10.3)
CO2: 29 mmol/L (ref 22–32)
CREATININE: 1.53 mg/dL — AB (ref 0.44–1.00)
Chloride: 104 mmol/L (ref 101–111)
GFR, EST AFRICAN AMERICAN: 34 mL/min — AB (ref >60)
GFR, EST NON AFRICAN AMERICAN: 30 mL/min — AB (ref >60)
Glucose, Bld: 146 mg/dL — ABNORMAL HIGH (ref 65–99)
Potassium: 3.1 mmol/L — ABNORMAL LOW (ref 3.5–5.1)
SODIUM: 140 mmol/L (ref 135–145)

## 2016-10-02 LAB — GLUCOSE, CAPILLARY
GLUCOSE-CAPILLARY: 150 mg/dL — AB (ref 65–99)
Glucose-Capillary: 124 mg/dL — ABNORMAL HIGH (ref 65–99)
Glucose-Capillary: 146 mg/dL — ABNORMAL HIGH (ref 65–99)

## 2016-10-02 LAB — PROTIME-INR
INR: 2.85
PROTHROMBIN TIME: 30.5 s — AB (ref 11.4–15.2)

## 2016-10-02 MED ORDER — POTASSIUM CHLORIDE CRYS ER 20 MEQ PO TBCR: 20.0000 meq | EXTENDED_RELEASE_TABLET | Freq: Every day | ORAL | 0 refills | Status: DC

## 2016-10-02 MED ORDER — POTASSIUM CHLORIDE CRYS ER 20 MEQ PO TBCR
40.0000 meq | EXTENDED_RELEASE_TABLET | Freq: Four times a day (QID) | ORAL | Status: AC
  Administered 2016-10-02 (×2): 40 meq via ORAL
  Filled 2016-10-02: qty 4
  Filled 2016-10-02: qty 2

## 2016-10-02 MED ORDER — FUROSEMIDE 40 MG PO TABS: 40.0000 mg | ORAL_TABLET | Freq: Every day | ORAL | 0 refills | Status: DC

## 2016-10-02 MED ORDER — LEVOFLOXACIN 500 MG PO TABS: 500.0000 mg | ORAL_TABLET | ORAL | 0 refills | Status: DC

## 2016-10-02 MED ORDER — MAGNESIUM SULFATE 2 GM/50ML IV SOLN
2.0000 g | Freq: Once | INTRAVENOUS | Status: AC
  Administered 2016-10-02: 2 g via INTRAVENOUS
  Filled 2016-10-02: qty 50

## 2016-10-02 MED ORDER — WARFARIN SODIUM 2 MG PO TABS
4.0000 mg | ORAL_TABLET | Freq: Once | ORAL | Status: AC
  Administered 2016-10-02: 4 mg via ORAL
  Filled 2016-10-02: qty 2

## 2016-10-02 NOTE — Progress Notes (Signed)
Patient is to be discharged [facility] in stable condition. Prior to discharge, patient's family spoke over the phone with MD per family request. IV removed. Patient ready for transport and [facility] (Rm [id number]). [facility]  Quita SkyeMorgan P Dishmon, RN

## 2016-10-02 NOTE — Evaluation (Signed)
Occupational Therapy Evaluation Patient Details Name: Glenda Dickson MRN: [id number] DOB: April 03, 1928 Today's Date: 10/02/2016    History of Present Illness 83 y.o. female with medical history significant for chronic atrial fibrillation on Coumadin, chronic normocytic anemia, hypertension, type 2 diabetes mellitus, and chronic kidney disease stage III-IV who presents to the emergency department with generalized weakness, loss of appetite, leg swelling, cough, and dyspnea. Patient reports that over the past 2 weeks she has developed progressive generalized weakness with increased bilateral lower extremity edema, cough, and now dyspnea. She denies any fevers or chills and denies orthopnea or PND. She denies any significant weight gain recently. She was recently treated for UTI and may have improved slightly with treatment, but the dyspnea has become more severe. She denies recent long distance travel or sick contacts and denies any recent fall or trauma.   Chest x-ray features left lower lobe pneumonia with associated pleural effusion.   Clinical Impression   Pt awake, alert, oriented x 4, agreeable to OT evaluation, son present for part of evaluation. Pt reports she is independent in ADL tasks, however son and niece report that she has been requiring increased assistance for the past few weeks, however at times pt refuses to allow anyone to assist her, therefore sits in urine due to inability to get to bathroom. Family also reports pt is supposed to receive HHPT services however pt calls to cancel frequently, 2/3 times per week. Pt currently requiring 2 person assistance with standing ADL completion and functional mobility tasks, demonstrating extremely unsafe tactics such as impulsively reaching for walls/objects and attempting to sit before being aligned at a chair. Strongly recommend SNF, discussed with pt who adamantly refuses. Family is concerned about being able to safely provide care for the pt.  If pt continues to refuse SNF, [facility]      Follow Up Recommendations  SNF;Home health OT;Supervision/Assistance - 24 hour;Other (comment) (Pt refusing SNF at this time)          Precautions / Restrictions Precautions Precautions: Fall Precaution Comments: Son reports that pt has had 2 falls recently.   Restrictions Weight Bearing Restrictions: No      Mobility Bed Mobility Overal bed mobility: Needs Assistance Bed Mobility: Supine to Sit     Supine to sit: Mod assist     General bed mobility comments: Increased time for pt to perform  Transfers Overall transfer level: Needs assistance Equipment used: Rolling Payne (2 wheeled) Transfers: Sit to/from Stand Sit to Stand: Mod assist;+2 physical assistance;+2 safety/equipment              Balance Overall balance assessment: History of Falls;Needs assistance Sitting-balance support: Feet supported;Bilateral upper extremity supported Sitting balance-Leahy Scale: Good     Standing balance support: Bilateral upper extremity supported Standing balance-Leahy Scale: Zero Standing balance comment: Pt requires +2 person assistance.                             ADL Overall ADL's : Needs assistance/impaired                 Upper Body Dressing : Maximal assistance;Sitting   Lower Body Dressing: Minimal assistance;Sitting/lateral leans   Toilet Transfer: Maximal assistance;+2 for physical assistance;Regular Toilet;RW Toilet Transfer Details (indicate cue type and reason): Pt impulsive and attempting to grab walls and grab bars well in advance of when needed, consistent verbal cuing and max assist for transfer Toileting-  Clothing Manipulation and Hygiene: Moderate assistance;Sitting/lateral lean;Sit to/from stand Toileting - Clothing Manipulation Details (indicate cue type and reason): Pt unaware of soiled pad and depends. Assistance with donning while seated on commode  as well as pulling up when in standing     Functional mobility during ADLs: Moderate assistance;+2 for physical assistance;Rolling [place]ak;Cueing for safety;Cueing for sequencing       Vision Vision Assessment?: No apparent visual deficits          Pertinent Vitals/Pain Pain Assessment: No/denies pain     Hand Dominance Right   Extremity/Trunk Assessment Upper Extremity Assessment Upper Extremity Assessment: Generalized weakness   Lower Extremity Assessment Lower Extremity Assessment: Generalized weakness       Communication Communication Communication: No difficulties   Cognition Arousal/Alertness: Awake/alert Behavior During Therapy: Impulsive Overall Cognitive Status: Within Functional Limits for tasks assessed Area of Impairment: Safety/judgement         Safety/Judgement: Decreased awareness of safety;Decreased awareness of deficits     General Comments: Pt requires extensive education on safety during mobility.              Home Living   Living Arrangements: Alone (Dtr has been staying for past week) Available Help at Discharge: Family;Available PRN/intermittently         Home Layout: One level               Home Equipment: [place] - 4 wheels          Prior Functioning/Environment Level of Independence: Needs assistance  Gait / Transfers Assistance Needed: Pt has most recently been requiring assistance for sit<>stand transfers, and short distance ambulation with rollator and assistance.  ADL's / Homemaking Assistance Needed: assistance for dressing and bathing.    Comments: Son and niece present, and express that she does not allow family to assist her, and she has been sitting in her own urine at times.          OT Problem List: Decreased strength;Decreased activity tolerance;Impaired balance (sitting and/or standing);Decreased safety awareness;Decreased knowledge of use of DME or AE                    Co-evaluation PT/OT/SLP  Co-Evaluation/Treatment: Yes Reason for Co-Treatment: Complexity of the patient's impairments (multi-system involvement);For patient/therapist safety;To address functional/ADL transfers;Necessary to address cognition/behavior during functional activity PT goals addressed during session: Mobility/safety with mobility;Balance;Proper use of DME OT goals addressed during session: ADL's and self-care;Proper use of Adaptive equipment and DME      End of Session Equipment Utilized During Treatment: Gait belt;Rolling Vanegas  Activity Tolerance: Patient limited by fatigue Patient left: in chair;with call bell/phone within reach;with family/visitor present   Time: 1610-9604 OT Time Calculation (min): 60 min Charges:  OT General Charges $OT Visit: 1 Procedure OT Evaluation $OT Eval Low Complexity: 1 Procedure Ezra Sites, OTR/L  [phone] 10/02/2016, 11:49 AM

## 2016-10-02 NOTE — NC FL2 (Signed)
[place] MEDICAID FL2 LEVEL OF CARE SCREENING TOOL     IDENTIFICATION  Patient Name: Glenda Dickson Birthdate: [birth year] Sex: female Admission Date (Current Location): 09/29/2016  [facility] [place]   Facility and Address:  [facility],  [address], [place] [id number]      Provider Number: [id number]  Attending Physician Name and Address:  Leroy SeaPrashant K Singh, MD  Relative Name and Phone Number:       Current Level of Care: Hospital Recommended Level of Care: Skilled Nursing Facility Prior Approval Number:    Date Approved/Denied:   PASRR Number: [id number]  Discharge Plan: SNF    Current Diagnoses: Patient Active Problem List   Diagnosis Date Noted  . Pressure injury of skin 09/30/2016  . CAP (community acquired pneumonia) 09/30/2016  . Community acquired pneumonia of right lower lobe of lung (HCC) 09/29/2016  . Supratherapeutic INR 09/29/2016  . Chronic diastolic CHF (congestive heart failure) (HCC) 09/29/2016  . Protein calorie malnutrition (HCC) 09/29/2016  . Leg swelling 09/29/2016  . Encounter for therapeutic drug monitoring 11/06/2015  . Chronic kidney disease (CKD), stage IV (severe) (HCC) 08/24/2012  . Cerebrovascular disease 07/20/2012  . Anemia, normocytic normochromic 06/26/2012  . Hypertension   . Diabetes mellitus type II, non insulin dependent (HCC)   . Hyperlipidemia   . Atrial fibrillation (HCC)     Orientation RESPIRATION BLADDER Height & Weight     Self, Time, Situation, Place  Normal Incontinent Weight: 180 lb 12.8 oz (82 kg) Height:  5\' 6"  (167.6 cm)  BEHAVIORAL SYMPTOMS/MOOD NEUROLOGICAL BOWEL NUTRITION STATUS  Other (Comment) (none)  (n/a) Incontinent Diet (Cardiac Low Salt Diet and 1.5 lit/day fluid restriction)  AMBULATORY STATUS COMMUNICATION OF NEEDS Skin   Extensive Assist Verbally Other (Comment), Bruising (Stage II to coccyx with foam dressing. Moisture associated skin damage to abdomen,  breasts, thighs. Rash to bilateral breasts. Weeping bilateral lower legs)                       Personal Care Assistance Level of Assistance  Bathing, Feeding, Dressing Bathing Assistance: Maximum assistance Feeding assistance: Limited assistance Dressing Assistance: Maximum assistance     Functional Limitations Info  Sight, Hearing, Speech Sight Info: Adequate Hearing Info: Adequate Speech Info: Adequate    SPECIAL CARE FACTORS FREQUENCY  PT (By licensed PT)     PT Frequency: 5              Contractures      Additional Factors Info  Insulin Sliding Scale, Psychotropic Code Status Info: Full code Allergies Info: Benzalkonium Chloride-alcohol, Codeine Psychotropic Info: Paxil         Current Medications (10/02/2016):  This is the current hospital active medication list Current Facility-Administered Medications  Medication Dose Route Frequency Provider Last Rate Last Dose  . 0.9 %  sodium chloride infusion  250 mL Intravenous PRN Briscoe Deutscherimothy S Opyd, MD      . acetaminophen (TYLENOL) tablet 650 mg  650 mg Oral Q6H PRN Briscoe Deutscherimothy S Opyd, MD       Or  . acetaminophen (TYLENOL) suppository 650 mg  650 mg Rectal Q6H PRN Briscoe Deutscherimothy S Opyd, MD      . atorvastatin (LIPITOR) tablet 40 mg  40 mg Oral q morning - 10a Briscoe Deutscherimothy S Opyd, MD   40 mg at 10/02/16 1055  . ferrous sulfate tablet 325 mg  325 mg Oral QPM Briscoe Deutscherimothy S Opyd, MD   325 mg at  10/01/16 1634  . furosemide (LASIX) tablet 40 mg  40 mg Oral Daily Dron Jaynie Collins, MD   40 mg at 10/02/16 1055  . insulin aspart (novoLOG) injection 0-5 Units  0-5 Units Subcutaneous QHS Timothy S Opyd, MD      . insulin aspart (novoLOG) injection 0-9 Units  0-9 Units Subcutaneous TID WC Briscoe Deutscher, MD   1 Units at 10/02/16 1222  . levofloxacin (LEVAQUIN) IVPB 500 mg  500 mg Intravenous Q48H Briscoe Deutscher, MD   500 mg at 10/01/16 1635  . metoprolol tartrate (LOPRESSOR) tablet 25 mg  25 mg Oral BID Briscoe Deutscher, MD   25 mg at 10/02/16  1055  . multivitamin with minerals tablet 1 tablet  1 tablet Oral Daily Briscoe Deutscher, MD   1 tablet at 10/02/16 1054  . ondansetron (ZOFRAN) tablet 4 mg  4 mg Oral Q6H PRN Briscoe Deutscher, MD       Or  . ondansetron (ZOFRAN) injection 4 mg  4 mg Intravenous Q6H PRN Briscoe Deutscher, MD      . PARoxetine (PAXIL) tablet 20 mg  20 mg Oral q morning - 10a Briscoe Deutscher, MD   20 mg at 10/02/16 1055  . polyethylene glycol (MIRALAX / GLYCOLAX) packet 17 g  17 g Oral Daily PRN Lavone Neri Opyd, MD      . potassium chloride SA (K-DUR,KLOR-CON) CR tablet 40 mEq  40 mEq Oral Q6H Leroy Sea, MD   40 mEq at 10/02/16 0846  . sodium chloride flush (NS) 0.9 % injection 3 mL  3 mL Intravenous Q12H Lavone Neri Opyd, MD   3 mL at 10/02/16 1059  . sodium chloride flush (NS) 0.9 % injection 3 mL  3 mL Intravenous PRN Briscoe Deutscher, MD      . traMADol (ULTRAM) tablet 50 mg  50 mg Oral Q6H PRN Briscoe Deutscher, MD   50 mg at 10/01/16 1540  . warfarin (COUMADIN) tablet 4 mg  4 mg Oral Once Leroy Sea, MD      . Warfarin - Pharmacist Dosing Inpatient   Does not apply Q24H Dron Jaynie Collins, MD   Stopped at 10/01/16 1600     Discharge Medications: Please see discharge summary for a list of discharge medications.  Relevant Imaging Results:  Relevant Lab Results:   Additional Information Private pay. SSN: [id number]  Karn Cassis, LCSW [phone]

## 2016-10-02 NOTE — Progress Notes (Signed)
ANTICOAGULATION CONSULT NOTE - follow up  Pharmacy Consult for coumadin Indication: atrial fibrillation  Allergies  Allergen Reactions  . Benzalkonium Chloride-Alcohol Other (See Comments)    Patient/family is not familiar with this allergy  . Codeine Itching and Nausea Only   Patient Measurements: Height: 5\' 6"  (167.6 cm) Weight: 180 lb 12.8 oz (82 kg) IBW/kg (Calculated) : 59.3  Vital Signs: Temp: 98 F (36.7 C) (01/10 0532) Temp Source: Oral (01/10 0532) BP: 118/71 (01/10 0532) Pulse Rate: 79 (01/10 0532)  Labs:  Recent Labs  09/29/16 1632 09/30/16 0513 10/01/16 0624 10/02/16 0634  HGB 10.1*  --   --   --   HCT 31.7*  --   --   --   PLT 192  --   --   --   LABPROT 46.0* 50.5* 39.6* 30.5*  INR 4.77* 5.35* 3.95 2.85  CREATININE 1.63* 1.60* 1.57* 1.53*   Estimated Creatinine Clearance: 28.5 mL/min (by C-G formula based on SCr of 1.53 mg/dL (H)).  Medical History: Past Medical History:  Diagnosis Date  . Anemia, normocytic normochromic 06/26/2012   H&H-11.2/33.7, normal MCV in 06/2012   . Atrial fibrillation (HCC)   . Depression   . Diabetes mellitus type II    no insulin  . Fracture of left hip (HCC) 2006   intertrochanteric; ORIF  . Hyperlipidemia   . Hypertension    Medications:  Prescriptions Prior to Admission  Medication Sig Dispense Refill Last Dose  . amLODipine (NORVASC) 10 MG tablet Take 10 mg by mouth every morning.    09/29/2016 at Unknown time  . atorvastatin (LIPITOR) 40 MG tablet Take 40 mg by mouth every morning.    09/29/2016 at Unknown time  . Calcium Citrate-Vitamin D (CITRACAL + D PO) Take 1 tablet by mouth daily. D3   UNKNOWN  . ferrous sulfate 325 (65 FE) MG tablet Take 325 mg by mouth every evening.   5 09/28/2016 at Unknown time  . furosemide (LASIX) 20 MG tablet Take 1 tablet (20 mg total) by mouth daily. (Patient taking differently: Take 40 mg by mouth daily. ) 90 tablet 3 09/29/2016 at Unknown time  . glipiZIDE (GLUCOTROL XL) 2.5 MG 24 hr  tablet Take 2.5 mg by mouth every evening.    09/28/2016 at Unknown time  . metoprolol (LOPRESSOR) 50 MG tablet Take 1 tablet (50 mg total) by mouth 2 (two) times daily. 180 tablet 3 09/29/2016 at 1100a  . Multiple Vitamin (MULTIVITAMIN) tablet Take 1 tablet by mouth daily.   Past Month at Unknown time  . PARoxetine (PAXIL) 20 MG tablet Take 20 mg by mouth every morning.    09/29/2016 at Unknown time  . potassium chloride (K-DUR,KLOR-CON) 10 MEQ tablet Take 10 mEq by mouth daily.  2 09/29/2016 at Unknown time  . ramipril (ALTACE) 5 MG capsule TAKE 1 CAPSULE BY MOUTH DAILY (Patient taking differently: TAKE 1 CAPSULE BY MOUTH DAILY IN THE EVENING) 90 capsule 0 09/28/2016 at Unknown time  . warfarin (COUMADIN) 2.5 MG tablet Take 1 tablet daily except 1 1/2 tablets on Mondays, Wednesdays and Fridays (Patient taking differently: Take 2.5-3.75 mg by mouth every evening. Take 1 tablet daily except 1 1/2 tablets on Mondays, Wednesdays and Fridays) 30 tablet 0 09/28/2016 at 2130  . cephALEXin (KEFLEX) 250 MG capsule Take 250 mg by mouth 2 (two) times daily. 7 day course starting on 09/20/2016  0 Completed Course at Unknown time  . fluconazole (DIFLUCAN) 150 MG tablet Take 150 mg by mouth  daily. 20177 day course starting on 12/29/  0 Completed Course at Unknown time  . predniSONE (DELTASONE) 20 MG tablet Take 1 tablet (20 mg total) by mouth 2 (two) times daily. (Patient not taking: Reported on 09/29/2016) 10 tablet 0 Not Taking at Unknown time   Assessment: 83 yo lady admitted with weakness to continue coumadin for afib.  INR was SUPRAtherapeutic on admission but now has trended down to goal range.  Pt has not received Coumadin in last 2 days. Pt also on Levaquin which can interact with Coumadin to increase INR.   Goal of Therapy:  INR 2-3 Monitor platelets by anticoagulation protocol: Yes   Plan:  Coumadin 4mg  po today x 1 F/u daily PT/INR Monitor for bleeding complications  Glenda Dickson 10/02/2016,8:29 AM

## 2016-10-02 NOTE — Discharge Summary (Signed)
Glenda Dickson WUJ:[id number] DOB: 02/13/1928 DOA: 09/29/2016  PCP: Dwana MelenaZack Hall, MD  Admit date: 09/29/2016  Discharge date: 10/02/2016  Admitted From: Home   Disposition:  Home   Recommendations for Outpatient Follow-up:   Follow up with PCP in 1-2 weeks  PCP Please obtain BMP/CBC, 2 view CXR in 1week,  (see Discharge instructions)   PCP Please follow up on the following pending results: After BMP, magnesium, diuretic dose closely   Home Health: PT, RN  Equipment/Devices: None Consultations: None Discharge Condition: Fair   CODE STATUS: Full   Diet Recommendation: Low-carb Heart Healthy 1.5 L total fluid restriction per day   Chief Complaint  Patient presents with  . Weakness     Brief history of present illness from the day of admission and additional interim summary    83 y.o.femalewith medical history significant forchronic atrial fibrillation on Coumadin, chronic normocytic anemia, hypertension, type 2 diabetes mellitus, and chronic kidney disease stage III-IV who presents to the emergency department with generalized weakness, loss of appetite, leg swelling, cough, and dyspnea. Patient reports that over the past 2 weeks she has developed progressive generalized weakness with increased bilateral lower extremity edema, cough, and now dyspnea. She denies any fevers or chills and denies orthopnea or PND. She denies any significant weight gain recently. She was recently treated for UTI and may have improved slightly with treatment, but the dyspnea has become more severe. She denies recent long distance travel or sick contacts and denies any recent fall or trauma. On antibiotics for community acquired pneumonia.  Hospital issues addressed     1. CAP - Treated with Levaquin, completely asymptomatic, off oxygen,  cough pleuritic chest pain or shortness of breath, we'll get 3 more doses of oral Levaquin, will be discharged home, request PCP to check CBC, two-view chest x-ray within a week.  2. Chronic A. fib. [place] 2 score of at least 4. She is stable, continue beta blocker and Coumadin. PCP to monitor INR. INR today 2.8.  3. Chronic diastolic CHF. EF around 55% on recent echo with lower extremity edema. She has mild lower extremity edema I think that is due to Norvasc, discontinued her Norvasc, increase Lasix, added TED stockings. Blood pressure is currently stable. I'll request her primary care physician to monitor weight, diuretic dose and blood pressure closely.  4. Lower extremity edema. Chronic likely due to Norvasc plan as a #3 above.  5. Dyslipidemia. On statin.  6. Hypertension. Currently on beta blocker, ACE and Lasix. PCP to monitor and adjust.  7. DM type II. Continue home regimen.  8. CKD4. Creatinine at baseline.  9. Chronic prednisone. Patient unsure why she is on it. Request PCP to address. No changes made.   Discharge diagnosis     Principal Problem:   Community acquired pneumonia of right lower lobe of lung (HCC) Active Problems:   Hypertension   Diabetes mellitus type II, non insulin dependent (HCC)   Atrial fibrillation (HCC)   Anemia, normocytic normochromic   Chronic kidney disease (  CKD), stage IV (severe) (HCC)   Supratherapeutic INR   Chronic diastolic CHF (congestive heart failure) (HCC)   Protein calorie malnutrition (HCC)   Leg swelling   Pressure injury of skin   CAP (community acquired pneumonia)    Discharge instructions    Discharge Instructions    Discharge instructions    Complete by:  As directed    Follow with Primary MD [name], MD in 2-3 days   Get CBC, CMP, Magnesium, INR, 2 view Chest X ray checked  by Primary MD in 2-3 days ( we routinely change or add medications that can affect your baseline labs and fluid status, therefore we  recommend that you get the mentioned basic workup next visit with your PCP, your PCP may decide not to get them or add new tests based on their clinical decision)   Activity: As tolerated with Full fall precautions use Kobayashi/cane & assistance as needed   Disposition Home    Diet:  Low Carb Heart Healthy  Check your Weight same time everyday, if you gain over 2 pounds, or you develop in leg swelling, experience more shortness of breath or chest pain, call your Primary MD immediately. Follow Cardiac Low Salt Diet and 1.5 lit/day fluid restriction.   On your next visit with your primary care physician please Get Medicines reviewed and adjusted.   Please request your Prim.MD to go over all Hospital Tests and Procedure/Radiological results at the follow up, please get all Hospital records sent to your Prim MD by signing hospital release before you go home.   If you experience worsening of your admission symptoms, develop shortness of breath, life threatening emergency, suicidal or homicidal thoughts you must seek medical attention immediately by calling 911 or calling your MD immediately  if symptoms less severe.  You Must read complete instructions/literature along with all the possible adverse reactions/side effects for all the Medicines you take and that have been prescribed to you. Take any new Medicines after you have completely understood and accpet all the possible adverse reactions/side effects.   Do not drive, operate heavy machinery, perform activities at heights, swimming or participation in water activities or provide baby sitting services if your were admitted for syncope or siezures until you have seen by Primary MD or a Neurologist and advised to do so again.  Do not drive when taking Pain medications.    Do not take more than prescribed Pain, Sleep and Anxiety Medications  Special Instructions: If you have smoked or chewed Tobacco  in the last 2 yrs please stop smoking, stop  any regular Alcohol  and or any Recreational drug use.  Wear Seat belts while driving.   Please note  You were cared for by a hospitalist during your hospital stay. If you have any questions about your discharge medications or the care you received while you were in the hospital after you are discharged, you can call the unit and asked to speak with the hospitalist on call if the hospitalist that took care of you is not available. Once you are discharged, your primary care physician will handle any further medical issues. Please note that NO REFILLS for any discharge medications will be authorized once you are discharged, as it is imperative that you return to your primary care physician (or establish a relationship with a primary care physician if you do not have one) for your aftercare needs so that they can reassess your need for medications and monitor your lab values.  Increase activity slowly    Complete by:  As directed       Discharge Medications   Allergies as of 10/02/2016      Reactions   Benzalkonium Chloride-alcohol Other (See Comments)   Patient/family is not familiar with this allergy   Codeine Itching, Nausea Only      Medication List    STOP taking these medications   amLODipine 10 MG tablet Commonly known as:  NORVASC   cephALEXin 250 MG capsule Commonly known as:  KEFLEX     TAKE these medications   atorvastatin 40 MG tablet Commonly known as:  LIPITOR Take 40 mg by mouth every morning.   CITRACAL + D PO Take 1 tablet by mouth daily. D3   ferrous sulfate 325 (65 FE) MG tablet Take 325 mg by mouth every evening.   fluconazole 150 MG tablet Commonly known as:  DIFLUCAN Take 150 mg by mouth daily. 20177 day course starting on 12/29/   furosemide 40 MG tablet Commonly known as:  LASIX Take 1 tablet (40 mg total) by mouth daily. What changed:  medication strength  how much to take   glipiZIDE 2.5 MG 24 hr tablet Commonly known as:  GLUCOTROL  XL Take 2.5 mg by mouth every evening.   levofloxacin 500 MG tablet Commonly known as:  LEVAQUIN Take 1 tablet (500 mg total) by mouth every other day. Start taking on:  10/03/2016   metoprolol 50 MG tablet Commonly known as:  LOPRESSOR Take 1 tablet (50 mg total) by mouth 2 (two) times daily.   multivitamin tablet Take 1 tablet by mouth daily.   PARoxetine 20 MG tablet Commonly known as:  PAXIL Take 20 mg by mouth every morning.   potassium chloride SA 20 MEQ tablet Commonly known as:  K-DUR,KLOR-CON Take 1 tablet (20 mEq total) by mouth daily. What changed:  medication strength  how much to take   predniSONE 20 MG tablet Commonly known as:  DELTASONE Take 1 tablet (20 mg total) by mouth 2 (two) times daily.   ramipril 5 MG capsule Commonly known as:  ALTACE TAKE 1 CAPSULE BY MOUTH DAILY What changed:  See the new instructions.   warfarin 2.5 MG tablet Commonly known as:  COUMADIN Take 1 tablet daily except 1 1/2 tablets on Mondays, Wednesdays and Fridays What changed:  how much to take  how to take this  when to take this  additional instructions       Follow-up Information    [name], MD. Schedule an appointment as soon as possible for a visit in 2 day(s).   Specialty:  Internal Medicine Contact information: [address] [place] [address] [phone]           Major procedures and Radiology Reports - PLEASE review detailed and final reports thoroughly  -         Dg Chest Port 1 View  Result Date: 09/29/2016 CLINICAL DATA:  83 year old with progressive generalized weakness over the past 2 weeks associated with bilateral lower extremity weeping edema. EXAM: PORTABLE CHEST 1 VIEW COMPARISON:  None. FINDINGS: Cardiac silhouette mildly enlarged for the AP portable technique, unchanged. Thoracic aorta atherosclerotic, unchanged. Hilar and mediastinal contours otherwise unremarkable. Dense consolidation in the left lower lobe associated  with a left pleural effusion. Lungs otherwise clear. No visible right pleural effusion. Pulmonary vascularity normal. Severe degenerative changes involving the right shoulder joint with dense calcifications involving the capsule and/or the subacromial bursa. IMPRESSION: 1. Left lower  lobe pneumonia and associated left pleural effusion. 2. Stable mild cardiomegaly without evidence of pulmonary edema. 3. Thoracic aortic atherosclerosis. Electronically Signed   By: Hulan Saas M.D.   On: 09/29/2016 17:13    Micro Results     Recent Results (from the past 240 hour(s))  Urine culture     Status: None   Collection Time: 09/29/16  5:57 PM  Result Value Ref Range Status   Specimen Description URINE, CLEAN CATCH  Final   Special Requests NONE  Final   Culture NO GROWTH Performed at [facility]   Final   Report Status 10/01/2016 FINAL  Final  Respiratory Panel by PCR     Status: None   Collection Time: 09/30/16  4:27 PM  Result Value Ref Range Status   Adenovirus NOT DETECTED NOT DETECTED Final   Coronavirus 229E NOT DETECTED NOT DETECTED Final   Coronavirus HKU1 NOT DETECTED NOT DETECTED Final   Coronavirus NL63 NOT DETECTED NOT DETECTED Final   Coronavirus OC43 NOT DETECTED NOT DETECTED Final   Metapneumovirus NOT DETECTED NOT DETECTED Final   Rhinovirus / Enterovirus NOT DETECTED NOT DETECTED Final   Influenza A NOT DETECTED NOT DETECTED Final   Influenza B NOT DETECTED NOT DETECTED Final   Parainfluenza Virus 1 NOT DETECTED NOT DETECTED Final   Parainfluenza Virus 2 NOT DETECTED NOT DETECTED Final   Parainfluenza Virus 3 NOT DETECTED NOT DETECTED Final   Parainfluenza Virus 4 NOT DETECTED NOT DETECTED Final   Respiratory Syncytial Virus NOT DETECTED NOT DETECTED Final   Bordetella pertussis NOT DETECTED NOT DETECTED Final   Chlamydophila pneumoniae NOT DETECTED NOT DETECTED Final   Mycoplasma pneumoniae NOT DETECTED NOT DETECTED Final    Comment: Performed at [facility]    Today   Subjective    Lenita Peregrina today has no headache,no chest abdominal pain,no new weakness tingling or numbness, feels much better wants to go home today.     Objective   Blood pressure 118/71, pulse 79, temperature 98 F (36.7 C), temperature source Oral, resp. rate 20, height 5\' 6"  (1.676 m), weight 82 kg (180 lb 12.8 oz), SpO2 98 %.  No intake or output data in the 24 hours ending 10/02/16 0920  Exam Awake Alert, Oriented x 3, No new F.N deficits, Normal affect [place].AT,PERRAL Supple Neck,No JVD, No cervical lymphadenopathy appriciated.  Symmetrical Chest wall movement, Good air movement bilaterally, CTAB RRR,No Gallops,Rubs or new Murmurs, No Parasternal Heave +ve B.Sounds, Abd Soft, Non tender, No organomegaly appriciated, No rebound -guarding or rigidity. No Cyanosis, Clubbing or edema, No new Rash or bruise   Data Review   CBC w Diff:  Lab Results  Component Value Date   WBC 10.6 (H) 09/29/2016   HGB 10.1 (L) 09/29/2016   HCT 31.7 (L) 09/29/2016   PLT 192 09/29/2016   LYMPHOPCT 15 09/29/2016   MONOPCT 6 09/29/2016   EOSPCT 1 09/29/2016   BASOPCT 0 09/29/2016    CMP:  Lab Results  Component Value Date   NA 140 10/02/2016   K 3.1 (L) 10/02/2016   CL 104 10/02/2016   CO2 29 10/02/2016   BUN 41 (H) 10/02/2016   CREATININE 1.53 (H) 10/02/2016   CREATININE 1.39 (H) 10/01/2012   PROT 5.1 (L) 09/30/2016   ALBUMIN 2.5 (L) 09/30/2016   BILITOT 1.1 09/30/2016   ALKPHOS 98 09/30/2016   AST 21 09/30/2016   ALT 14 09/30/2016  .   Total Time in preparing paper work, data  evaluation and todays exam - 35 minutes  Leroy Sea M.D on 10/02/2016 at 9:20 AM  [facility]   Office  [phone]

## 2016-10-02 NOTE — Care Management Important Message (Signed)
Important Message  Patient Details  Name: Jeani SowSarah T Valera MRN: [id number] Date of Birth: [birth year]   Medicare Important Message Given:  Yes    Kobee Medlen, Chrystine OilerSharley Diane, RN 10/02/2016, 3:21 PM

## 2016-10-02 NOTE — Progress Notes (Signed)
Pharmacy Antibiotic Note  Glenda Dickson is a 83 y.o. female admitted on 09/29/2016 with pneumonia.  Pharmacy has been consulted for levaquin dosing.   Plan: Cont levaquin 500 mg IV q48 hours Consider switch to PO when improved. F/u renal function, cultures and clinical course  Height: 5\' 6"  (167.6 cm) Weight: 180 lb 12.8 oz (82 kg) IBW/kg (Calculated) : 59.3  Temp (24hrs), Avg:98.3 F (36.8 C), Min:98 F (36.7 C), Max:98.6 F (37 C)   Recent Labs Lab 09/29/16 1632 09/30/16 0513 10/01/16 0624 10/02/16 0634  WBC 10.6*  --   --   --   CREATININE 1.63* 1.60* 1.57* 1.53*    Estimated Creatinine Clearance: 28.5 mL/min (by C-G formula based on SCr of 1.53 mg/dL (H)).    Allergies  Allergen Reactions  . Benzalkonium Chloride-Alcohol Other (See Comments)    Patient/family is not familiar with this allergy  . Codeine Itching and Nausea Only   Antimicrobials this admission: levaquin 1/7 >>   Thank you for allowing pharmacy to be a part of this patient's care.  Valrie HartHall, [name] 10/02/2016 8:34 AM

## 2016-10-02 NOTE — Discharge Instructions (Signed)
Follow with Primary MD Dwana MelenaZack Hall, MD in 2-3 days   Get CBC, CMP, Magnesium, INR, 2 view Chest X ray checked  by Primary MD in 2-3 days ( we routinely change or add medications that can affect your baseline labs and fluid status, therefore we recommend that you get the mentioned basic workup next visit with your PCP, your PCP may decide not to get them or add new tests based on their clinical decision)   Activity: As tolerated with Full fall precautions use Zabinski/cane & assistance as needed   Disposition Home    Diet:  Low Carb Heart Healthy  Check your Weight same time everyday, if you gain over 2 pounds, or you develop in leg swelling, experience more shortness of breath or chest pain, call your Primary MD immediately. Follow Cardiac Low Salt Diet and 1.5 lit/day fluid restriction.   On your next visit with your primary care physician please Get Medicines reviewed and adjusted.   Please request your Prim.MD to go over all Hospital Tests and Procedure/Radiological results at the follow up, please get all Hospital records sent to your Prim MD by signing hospital release before you go home.   If you experience worsening of your admission symptoms, develop shortness of breath, life threatening emergency, suicidal or homicidal thoughts you must seek medical attention immediately by calling 911 or calling your MD immediately  if symptoms less severe.  You Must read complete instructions/literature along with all the possible adverse reactions/side effects for all the Medicines you take and that have been prescribed to you. Take any new Medicines after you have completely understood and accpet all the possible adverse reactions/side effects.   Do not drive, operate heavy machinery, perform activities at heights, swimming or participation in water activities or provide baby sitting services if your were admitted for syncope or siezures until you have seen by Primary MD or a Neurologist and advised  to do so again.  Do not drive when taking Pain medications.    Do not take more than prescribed Pain, Sleep and Anxiety Medications  Special Instructions: If you have smoked or chewed Tobacco  in the last 2 yrs please stop smoking, stop any regular Alcohol  and or any Recreational drug use.  Wear Seat belts while driving.   Please note  You were cared for by a hospitalist during your hospital stay. If you have any questions about your discharge medications or the care you received while you were in the hospital after you are discharged, you can call the unit and asked to speak with the hospitalist on call if the hospitalist that took care of you is not available. Once you are discharged, your primary care physician will handle any further medical issues. Please note that NO REFILLS for any discharge medications will be authorized once you are discharged, as it is imperative that you return to your primary care physician (or establish a relationship with a primary care physician if you do not have one) for your aftercare needs so that they can reassess your need for medications and monitor your lab values.

## 2016-10-02 NOTE — Evaluation (Signed)
Physical Therapy Evaluation Patient Details Name: Glenda Dickson MRN: [id number] DOB: 05/26/1928 Today's Date: 10/02/2016   History of Present Illness  83 y.o. female with medical history significant for chronic atrial fibrillation on Coumadin, chronic normocytic anemia, hypertension, type 2 diabetes mellitus, and chronic kidney disease stage III-IV who presents to the emergency department with generalized weakness, loss of appetite, leg swelling, cough, and dyspnea. Patient reports that over the past 2 weeks she has developed progressive generalized weakness with increased bilateral lower extremity edema, cough, and now dyspnea. She denies any fevers or chills and denies orthopnea or PND. She denies any significant weight gain recently. She was recently treated for UTI and may have improved slightly with treatment, but the dyspnea has become more severe. She denies recent long distance travel or sick contacts and denies any recent fall or trauma.   Chest x-ray features left lower lobe pneumonia with associated pleural effusion.  Clinical Impression  Pt received sitting up on the EOB with OT present.  Son and niece outside the room and expressed that they are having difficulty caring for the patient at home due to patient refusing to allow them to assist her.  She has had 2 falls, and normally uses a rollator for ambulation.  She has recently required assistance for transfers as well as ambulation.  Family also reports that the patient is currently receiving HHPT, but the patient calls and cancels at least 2 out of 3 times per week.  During evaluation, she required +2 person assist for gait x 69ft with RW to get into the bathroom.  She is very unsafe, and requires assistance for RW navigation, and cues to keep hands on the RW instead of reaching for objects in the room. Educated pt on recommendations for SNF, however pt is adamant that she will be going home.  She is an extremely high fall risk, and is not  safe to d/c home at this time, but she is refusing SNF.  She will need 24/7 supervision/assistance, continued HHPT, and a w/c if she d/c home.      Follow Up Recommendations SNF (However, pt is adamantly refusing SNF, therefore, recommend HHPT and 24/7 supervision/assistance)    Equipment Recommendations  Wheelchair (measurements PT);Wheelchair cushion (measurements PT)    Recommendations for Other Services       Precautions / Restrictions Precautions Precautions: Fall Precaution Comments: Son reports that pt has had 2 falls recently.   Restrictions Weight Bearing Restrictions: No      Mobility  Bed Mobility Overal bed mobility:  (Not observed)                Transfers Overall transfer level: Needs assistance Equipment used: Rolling [place] (2 wheeled) Transfers: Sit to/from Stand Sit to Stand: Mod assist;+2 physical assistance;+2 safety/equipment            Ambulation/Gait Ambulation/Gait assistance: Mod assist;+2 physical assistance;+2 safety/equipment Ambulation Distance (Feet): 10 Feet Assistive device: Rolling [place] (2 wheeled) Gait Pattern/deviations: Step-to pattern     General Gait Details: Pt demonstrates poor weight bearing through the R LE due to pain, however she expresses that both feet are painful with weight bearing.  She requires assistance for RW navigation due to pushing the RW too far forward.  She also requires assistance due to poor safety awareness - trying to grab onto other objects in the room instead of keeping her hands on the RW.  Pt did this at least 5 times during mobiltiy.  She was able to  ambulate into the bathroom and use the commode, but requires assistance for hygiene.  Recliner pulled up to bathroom doorway, and she was then assisted into the recliner when finished using the bathroom.    Stairs            Wheelchair Mobility    Modified Rankin (Stroke Patients Only)       Balance Overall balance assessment: History of  Falls;Needs assistance Sitting-balance support: Feet supported;Bilateral upper extremity supported Sitting balance-Leahy Scale: Good     Standing balance support: Bilateral upper extremity supported Standing balance-Leahy Scale: Zero Standing balance comment: Pt requires +2 person assistance.                              Pertinent Vitals/Pain Pain Assessment: No/denies pain    Home Living   Living Arrangements: Alone (Dtr has been staying with her for the past week, however she normally lives alone) Available Help at Discharge: Family;Available PRN/intermittently         Home Layout: One level Home Equipment: [place] - 4 wheels      Prior Function Level of Independence: Needs assistance   Gait / Transfers Assistance Needed: Pt has most recently been requiring assistance for sit<>stand transfers, and short distance ambulation with rollator and assistance.   ADL's / Homemaking Assistance Needed: assistance for dressing and bathing.   Comments: Son and niece present, and express that she does not allow family to assist her, and she has been sitting in her own urine at times.       Hand Dominance        Extremity/Trunk Assessment   Upper Extremity Assessment Upper Extremity Assessment: Generalized weakness    Lower Extremity Assessment Lower Extremity Assessment: Generalized weakness       Communication   Communication: No difficulties  Cognition Arousal/Alertness: Awake/alert Behavior During Therapy: Impulsive Overall Cognitive Status: Within Functional Limits for tasks assessed Area of Impairment: Safety/judgement         Safety/Judgement: Decreased awareness of safety;Decreased awareness of deficits     General Comments: Pt requires extensive education on safety during mobility.    General Comments      Exercises     Assessment/Plan    PT Assessment Patient needs continued PT services  PT Problem List Decreased strength;Decreased  activity tolerance;Decreased balance;Decreased mobility;Decreased cognition;Decreased safety awareness;Decreased knowledge of use of DME;Decreased knowledge of precautions;Obesity;Decreased skin integrity;Pain          PT Treatment Interventions DME instruction;Gait training;Functional mobility training;Therapeutic activities;Therapeutic exercise;Balance training;Patient/family education;Wheelchair mobility training    PT Goals (Current goals can be found in the Care Plan section)  Acute Rehab PT Goals Patient Stated Goal: Pt wants to go home and be able to walk from her bed to the bathroom by herself.  PT Goal Formulation: With patient Time For Goal Achievement: 10/09/16 Potential to Achieve Goals: Poor    Frequency Min 3X/week   Barriers to discharge Decreased caregiver support      Co-evaluation PT/OT/SLP Co-Evaluation/Treatment: Yes Reason for Co-Treatment: Complexity of the patient's impairments (multi-system involvement);Necessary to address cognition/behavior during functional activity;For patient/therapist safety;To address functional/ADL transfers PT goals addressed during session: Mobility/safety with mobility;Balance;Proper use of DME         End of Session Equipment Utilized During Treatment: Gait belt Activity Tolerance: Patient limited by pain;Patient limited by fatigue Patient left: in chair;with call bell/phone within reach Nurse Communication: Mobility status;Need for lift equipment (Recommend STEDY for transfers )  Functional Assessment Tool Used: [employer] AM-PAC "6-clicks"  Functional Limitation: Mobility: Walking and moving around Mobility: Walking and Moving Around Current Status [phone](G8978): At least 40 percent but less than 60 percent impaired, limited or restricted Mobility: Walking and Moving Around Goal Status [phone](G8979): At least 20 percent but less than 40 percent impaired, limited or restricted    Time: 1010-1050 PT Time Calculation (min)  (ACUTE ONLY): 40 min   Charges:   PT Evaluation $PT Eval Moderate Complexity: 1 Procedure PT Treatments $Therapeutic Activity: 8-22 mins   PT G Codes:   PT G-Codes **NOT FOR INPATIENT CLASS** Functional Assessment Tool Used: [employer] AM-PAC "6-clicks"  Functional Limitation: Mobility: Walking and moving around Mobility: Walking and Moving Around Current Status [phone](G8978): At least 40 percent but less than 60 percent impaired, limited or restricted Mobility: Walking and Moving Around Goal Status [phone](G8979): At least 20 percent but less than 40 percent impaired, limited or restricted    Beth Edy Mcbane, PT, DPT X: [phone]

## 2016-10-02 NOTE — Clinical Social Work Placement (Signed)
   CLINICAL SOCIAL WORK PLACEMENT  NOTE  Date:  10/02/2016  Patient Details  Name: Glenda Dickson MRN: [id number] Date of Birth: May 28, 1928  Clinical Social Work is seeking post-discharge placement for this patient at the Skilled  Nursing Facility level of care (*CSW will initial, date and re-position this form in  chart as items are completed):  Yes   Patient/family provided with [place] Clinical Social Work Department's list of facilities offering this level of care within the geographic area requested by the patient (or if unable, by the patient's family).  Yes   Patient/family informed of their freedom to choose among providers that offer the needed level of care, that participate in Medicare, Medicaid or managed care program needed by the patient, have an available bed and are willing to accept the patient.  Yes   Patient/family informed of [facility]'s ownership interest [facility] and [facility], as well as of the fact that they are under no obligation to receive care at these facilities.  PASRR submitted to EDS on 10/02/16     PASRR number received on 10/02/16     Existing PASRR number confirmed on       FL2 transmitted to all facilities in geographic area requested by pt/family on 10/02/16     FL2 transmitted to all facilities within larger geographic area on       Patient informed that his/her managed care company has contracts with or will negotiate with certain facilities, including the following:        Yes   Patient/family informed of bed offers received.  Patient [facility] bed at [facility]     Physician recommends and patient chooses bed at      [facility] on 10/02/16.  Patient to be transferred to facility by staff     Patient family notified on 10/02/16 of transfer.  Name of family member notified:  Glenda Dickson- son     PHYSICIAN       Additional Comment:     _______________________________________________ Karn CassisStultz, Ayona Yniguez [place], LCSW 10/02/2016, 3:36 PM [phone]

## 2016-10-02 NOTE — Clinical Social Work Note (Addendum)
Clinical Social Work Assessment  Patient Details  Name: Glenda Dickson MRN: [id number] Date of Birth: [birth year]  Date of referral:  10/02/16               Reason for consult:  Discharge Planning                Permission sought to share information with:  Family Supports Permission granted to share information::  Yes, Verbal Permission Granted  Name::        Agency::     Relationship::  granddaughter, son  Contact Information:     Housing/Transportation Living arrangements for the past 2 months:  Single Family Home Source of Information:  Patient, Adult Children Patient Interpreter Needed:  None Criminal Activity/Legal Involvement Pertinent to Current Situation/Hospitalization:  No - Comment as needed Significant Relationships:  Adult Children, Other Family Members Lives with:  Self Do you feel safe going back to the place where you live?  No (needs rehab) Need for family participation in patient care:  Yes (Comment)  Care giving concerns:  Pt lives alone.    Social Worker assessment / plan:  CSW met with pt, pt's son Glenda Dickson and pt's granddaughter at bedside. Pt alert and oriented and reports she lives alone. Family live locally and are very involved. Family shared that pt has been declining the last month at home. Pt admits that she is weak. PT evaluated pt and recommend SNF. CSW explained placement process and that pt does not meet Medicare 3 day qualifying stay criteria. Pt does not have long term care insurance or Medicaid. Pt initially did not want to pay privately, but son said he would if needed. Pt then said she would rather pay than him and eventually agreed to placement in [place]. Bed available at [facility] and pt accepts. Pt and family aware that $7200 check must be given to facility upon admission today.   Employment status:  Retired Forensic scientist:  Medicare PT Recommendations:  [place] / Referral to community resources:  [place]  Patient/Family's Response to care:  Pt very reluctant to agree to SNF, but eventually said she would pay privately.   Patient/Family's Understanding of and Emotional Response to Diagnosis, Current Treatment, and Prognosis:  Pt and family concerned about why pt does not meet Medicare 3 day stay. CSW explained this and that pt is medically stable for d/c and would need to pay privately.  Emotional Assessment Appearance:  Appears stated age Attitude/Demeanor/Rapport:  Other (Frustrated) Affect (typically observed):  Frustrated Orientation:  Oriented to Self, Oriented to Place, Oriented to  Time, Oriented to Situation Alcohol / Substance use:  Not Applicable Psych involvement (Current and /or in the community):  No (Comment)  Discharge Needs  Concerns to be addressed:  Discharge Planning Concerns Readmission within the last 30 days:  No Current discharge risk:  Physical Impairment Barriers to Discharge:  No Barriers Identified   Salome Arnt, [place] 10/02/2016, 3:37 PM [phone]

## 2016-10-03 ENCOUNTER — Encounter (HOSPITAL_COMMUNITY)
Admission: RE | Admit: 2016-10-03 | Discharge: 2016-10-03 | Disposition: A | Discharge status: Home / Self Care | Payer: Medicare Other | Source: Skilled Nursing Facility | Attending: Internal Medicine | Admitting: Internal Medicine

## 2016-10-03 ENCOUNTER — Encounter: Payer: Self-pay | Admitting: Internal Medicine

## 2016-10-03 ENCOUNTER — Non-Acute Institutional Stay (SKILLED_NURSING_FACILITY): Payer: Medicare Other | Admitting: Internal Medicine

## 2016-10-03 DIAGNOSIS — I482 Chronic atrial fibrillation, unspecified: Secondary | ICD-10-CM

## 2016-10-03 DIAGNOSIS — I5032 Chronic diastolic (congestive) heart failure: Secondary | ICD-10-CM

## 2016-10-03 DIAGNOSIS — Z7901 Long term (current) use of anticoagulants: Secondary | ICD-10-CM | POA: Diagnosis not present

## 2016-10-03 DIAGNOSIS — J189 Pneumonia, unspecified organism: Secondary | ICD-10-CM | POA: Insufficient documentation

## 2016-10-03 DIAGNOSIS — D649 Anemia, unspecified: Secondary | ICD-10-CM | POA: Diagnosis not present

## 2016-10-03 DIAGNOSIS — E119 Type 2 diabetes mellitus without complications: Secondary | ICD-10-CM

## 2016-10-03 DIAGNOSIS — Z79899 Other long term (current) drug therapy: Secondary | ICD-10-CM | POA: Insufficient documentation

## 2016-10-03 DIAGNOSIS — N184 Chronic kidney disease, stage 4 (severe): Secondary | ICD-10-CM | POA: Insufficient documentation

## 2016-10-03 DIAGNOSIS — M7989 Other specified soft tissue disorders: Secondary | ICD-10-CM

## 2016-10-03 DIAGNOSIS — J181 Lobar pneumonia, unspecified organism: Secondary | ICD-10-CM

## 2016-10-03 DIAGNOSIS — M6281 Muscle weakness (generalized): Secondary | ICD-10-CM | POA: Insufficient documentation

## 2016-10-03 DIAGNOSIS — R279 Unspecified lack of coordination: Secondary | ICD-10-CM | POA: Diagnosis not present

## 2016-10-03 LAB — PROTIME-INR
INR: 1.9
Prothrombin Time: 22.1 seconds — ABNORMAL HIGH (ref 11.4–15.2)

## 2016-10-03 LAB — BASIC METABOLIC PANEL
Anion gap: 7 (ref 5–15)
BUN: 46 mg/dL — AB (ref 6–20)
CALCIUM: 8.6 mg/dL — AB (ref 8.9–10.3)
CO2: 26 mmol/L (ref 22–32)
Chloride: 105 mmol/L (ref 101–111)
Creatinine, Ser: 1.51 mg/dL — ABNORMAL HIGH (ref 0.44–1.00)
GFR calc Af Amer: 35 mL/min — ABNORMAL LOW (ref >60)
GFR, EST NON AFRICAN AMERICAN: 30 mL/min — AB (ref >60)
Glucose, Bld: 225 mg/dL — ABNORMAL HIGH (ref 65–99)
POTASSIUM: 4.5 mmol/L (ref 3.5–5.1)
Sodium: 138 mmol/L (ref 135–145)

## 2016-10-03 LAB — CBC
HCT: 27.6 % — ABNORMAL LOW (ref 36.0–46.0)
Hemoglobin: 8.9 g/dL — ABNORMAL LOW (ref 12.0–15.0)
MCH: 29.4 pg (ref 26.0–34.0)
MCHC: 32.2 g/dL (ref 30.0–36.0)
MCV: 91.1 fL (ref 78.0–100.0)
PLATELETS: 161 10*3/uL (ref 150–400)
RBC: 3.03 MIL/uL — ABNORMAL LOW (ref 3.87–5.11)
RDW: 16.7 % — AB (ref 11.5–15.5)
WBC: 6.2 10*3/uL (ref 4.0–10.5)

## 2016-10-03 NOTE — Progress Notes (Signed)
Provider:  Einar Crow Location:   [facility] Room Number: 152/D Place of Service:  SNF (31)  PCP: [name], MD Patient Care Team: Benita Stabile, MD as PCP - General (Internal Medicine) Kathlen Brunswick, MD (Cardiology) Kathrin Penner, RN as [facility] Care Management  Extended Emergency Contact Information Primary Emergency Contact: Forrest Moron Address: [address]          [place] [address] [place] Home Phone: [phone] Relation: Daughter Secondary Emergency Contact: Burr [place] Home Phone: [phone] Relation: Son  Code Status: Full Code Goals of Care: Advanced Directive information Advanced Directives 10/03/2016  Does Patient Have a Medical Advance Directive? Yes  Type of Advance Directive (No Data)  Does patient want to make changes to medical advance directive? No - Patient declined  Copy of Healthcare Power of Attorney in Chart? -  Would patient like information on creating a medical advance directive? -      Chief Complaint  Patient presents with  . New Admit To SNF    HPI: Patient is a 83 y.o. female seen today for admission to the facility for Therapy. Patient has h/o Chronic Atrial fibrillation, Anemia, Hypertension, Type 2 Diabetes, Chronic Renal disease  She presented to hospital for Generalized weakness, Decreased appetite, and mainly for worsening swelling of her legs. She had Left lower lobe pneumonia on Chest Xray and was treated with Levaquin. She was also Supra therapeutic at INR of 4.77. Her Norvasc was discontinued due to LE swelling. And her coumadin was on hold.  She denies any problem with Cough, Chest pain, SOB. She continues to have redness and swelling of her LE but says they are much better. She is able to walk with [place] in the facility but feels little weak.  She lives by herself at home but has big family who help take care of her especially her son and  daughter.  Past Medical History:  Diagnosis Date  . Anemia, normocytic normochromic 06/26/2012   H&H-11.2/33.7, normal MCV in 06/2012   . Atrial fibrillation (HCC)   . Depression   . Diabetes mellitus type II    no insulin  . Fracture of left hip (HCC) 2006   intertrochanteric; ORIF  . Hyperlipidemia   . Hypertension    Past Surgical History:  Procedure Laterality Date  . ABDOMINAL HYSTERECTOMY    . COLONOSCOPY  2003   Negative screening study.  Drucilla Chalet HIP FRACTURE SURGERY     Fixation    reports that she has never smoked. She has never used smokeless tobacco. She reports that she does not drink alcohol or use drugs. Social History   Social History  . Marital status: Widowed    Spouse name: N/A  . Number of children: N/A  . Years of education: N/A   Occupational History  . Not on file.   Social History Main Topics  . Smoking status: Never Smoker  . Smokeless tobacco: Never Used  . Alcohol use No  . Drug use: No  . Sexual activity: Not Currently   Other Topics Concern  . Not on file   Social History Narrative  . No narrative on file    Functional Status Survey:    Family History  Problem Relation Age of Onset  . Hypertension Mother   . Heart failure Mother   . Heart attack Father   . Hypertension Father   . Hypertension Sister     Atrial  Fib  . Hypertension Brother     4/6 brothers with htn    Health Maintenance  Topic Date Due  . FOOT EXAM  08/02/1940  . OPHTHALMOLOGY EXAM  08/02/1940  . URINE MICROALBUMIN  08/02/1940  . TETANUS/TDAP  08/02/1949  . ZOSTAVAX  08/02/1990  . DEXA SCAN  08/03/1995  . INFLUENZA VACCINE  08/23/2017 (Originally 04/23/2016)  . PNA vac Low Risk Adult (1 of 2 - PCV13) 08/23/2017 (Originally 08/03/1995)  . HEMOGLOBIN A1C  03/29/2017    Allergies  Allergen Reactions  . Benzalkonium Chloride-Alcohol Other (See Comments)    Patient/family is not familiar with this allergy  . Codeine Itching and Nausea Only      Review of Systems  Constitutional: Negative for activity change, appetite change, chills, fatigue and fever.  HENT: Negative for congestion, postnasal drip and rhinorrhea.   Respiratory: Negative for cough, choking, shortness of breath and wheezing.   Cardiovascular: Positive for leg swelling. Negative for chest pain and palpitations.  Gastrointestinal: Negative for abdominal distention, abdominal pain, constipation, diarrhea, nausea and vomiting.  Genitourinary: Negative for dysuria, frequency and pelvic pain.  Musculoskeletal: Negative.   Skin: Positive for color change and rash.  Neurological: Positive for weakness. Negative for dizziness, tremors and light-headedness.  Psychiatric/Behavioral: Negative for behavioral problems, confusion and sleep disturbance. The patient is not nervous/anxious.    Current Outpatient Prescriptions on File Prior to Visit  Medication Sig Dispense Refill  . atorvastatin (LIPITOR) 40 MG tablet Take 40 mg by mouth every morning.     . Calcium Citrate-Vitamin D (CITRACAL + D PO) Take 1 tablet by mouth daily. D3    . ferrous sulfate 325 (65 FE) MG tablet Take 325 mg by mouth every evening.   5  . furosemide (LASIX) 40 MG tablet Take 1 tablet (40 mg total) by mouth daily. 30 tablet 0  . glipiZIDE (GLUCOTROL XL) 2.5 MG 24 hr tablet Take 2.5 mg by mouth every evening.     Marland Kitchen levofloxacin (LEVAQUIN) 500 MG tablet Take 1 tablet (500 mg total) by mouth every other day. 3 tablet 0  . metoprolol (LOPRESSOR) 50 MG tablet Take 1 tablet (50 mg total) by mouth 2 (two) times daily. 180 tablet 3  . Multiple Vitamin (MULTIVITAMIN) tablet Take 1 tablet by mouth daily.    Marland Kitchen PARoxetine (PAXIL) 20 MG tablet Take 20 mg by mouth every morning.     . potassium chloride (K-DUR,KLOR-CON) 20 MEQ tablet Take 1 tablet (20 mEq total) by mouth daily. 30 tablet 0  . predniSONE (DELTASONE) 20 MG tablet Take 1 tablet (20 mg total) by mouth 2 (two) times daily. 10 tablet 0  . ramipril  (ALTACE) 5 MG capsule TAKE 1 CAPSULE BY MOUTH DAILY 90 capsule 0   No current facility-administered medications on file prior to visit.     Vitals:   10/03/16 1633  BP: (!) 144/74  Pulse: 62  Resp: 18  Temp: 97.8 F (36.6 C)   There is no height or weight on file to calculate BMI. Physical Exam  Constitutional: She is oriented to person, place, and time. She appears well-developed and well-nourished.  HENT:  Head: Normocephalic.  Mouth/Throat: Oropharynx is clear and moist.  Eyes: Pupils are equal, round, and reactive to light.  Neck: Neck supple.  Cardiovascular: Normal rate.  An irregularly irregular rhythm present.  No murmur heard. Pulmonary/Chest: Effort normal and breath sounds normal. No respiratory distress. She has no wheezes. She has no rales.  Abdominal: Soft. Bowel  sounds are normal. She exhibits no distension. There is no tenderness. There is no rebound.  Lymphadenopathy:    She has no cervical adenopathy.  Neurological: She is alert and oriented to person, place, and time.  Had good strength in UE and Mild weakness an both LE  Skin:  Patient has swelling in both LE Right more then Left. With redness and petechial rash. Not warm to touch and Have 3 + Pitting edema.    Labs reviewed: Basic Metabolic Panel:  Recent Labs  16/06/9600/09/18 0624 10/02/16 0634 10/03/16 0500  NA 139 140 138  K 3.3* 3.1* 4.5  CL 105 104 105  CO2 27 29 26   GLUCOSE 120* 146* 225*  BUN 39* 41* 46*  CREATININE 1.57* 1.53* 1.51*  CALCIUM 7.9* 7.9* 8.6*  MG 1.5*  --   --    Liver Function Tests:  Recent Labs  10/23/15 1940 09/29/16 1632 09/30/16 0513  AST 18 24 21   ALT 13* 16 14  ALKPHOS 85 132* 98  BILITOT 1.9* 1.9* 1.1  PROT 6.5 6.1* 5.1*  ALBUMIN 3.8 2.7* 2.5*   No results for input(s): LIPASE, AMYLASE in the last 8760 hours. No results for input(s): AMMONIA in the last 8760 hours. CBC:  Recent Labs  10/23/15 1940 07/17/16 1726 09/29/16 1632 10/03/16 0500  WBC 6.0  6.8 10.6* 6.2  NEUTROABS 4.6 4.7 8.2*  --   HGB 10.2* 10.9* 10.1* 8.9*  HCT 31.8* 33.5* 31.7* 27.6*  MCV 93.0 88.2 93.2 91.1  PLT 148* 143* 192 161   Cardiac Enzymes: No results for input(s): CKTOTAL, CKMB, CKMBINDEX, TROPONINI in the last 8760 hours. BNP: Invalid input(s): POCBNP Lab Results  Component Value Date   HGBA1C 5.8 (H) 09/29/2016   Lab Results  Component Value Date   TSH 5.046 (H) 06/25/2012   No results found for: VITAMINB12 No results found for: FOLATE Lab Results  Component Value Date   IRON 58 10/01/2012   TIBC 330 10/01/2012   FERRITIN 83 10/01/2012    Imaging and Procedures obtained prior to SNF admission: No results found.  Assessment/Plan  Chronic diastolic CHF (congestive heart failure) (HCC) Patient Echo showed EF of 50% Daily Weights Continue lasix 40 mg. Repeat BMP  Community acquired pneumonia of left lower lobe of lung  Patient On levaquin. 3 more doses left  Diabetes mellitus type II On glipizide A1C was 5.8 Restarted Glipizide Accucheck QD  Leg swelling Not sure of etiology. Can be combination of Venous stasis and Diastolic CHF though her lungs are clear. Will continue Lasix. D/W Wound care nurse would not wrap it right now due to redness and follow it.  Atril fibrilation Patient was restarted on same dose of coumadin but will decrease the dose as she was supratherapeutic in the hospital. Rate controlled. Anemia Her Hgb is down from 10 -to 8.9 Will check stool for Occult blood. Do iron studies and B12 Repeat CBC in few days.  Chronic Prednisone  Patient was started on Prednisone by ED due to some gout attack in 10/17. Will slowly taper her off Prednisone.    Family/ staff Communication:   Labs/tests ordered: Total time spent in this patient care encounter was _60 minutes; greater than 50% of the visit spent counseling patient and coordinating care for problems addressed at this encounter.

## 2016-10-04 DIAGNOSIS — J189 Pneumonia, unspecified organism: Secondary | ICD-10-CM | POA: Diagnosis not present

## 2016-10-04 DIAGNOSIS — R279 Unspecified lack of coordination: Secondary | ICD-10-CM | POA: Diagnosis not present

## 2016-10-04 DIAGNOSIS — N184 Chronic kidney disease, stage 4 (severe): Secondary | ICD-10-CM | POA: Diagnosis not present

## 2016-10-04 DIAGNOSIS — M6281 Muscle weakness (generalized): Secondary | ICD-10-CM | POA: Diagnosis not present

## 2016-10-07 ENCOUNTER — Non-Acute Institutional Stay (SKILLED_NURSING_FACILITY): Payer: Medicare Other | Admitting: Internal Medicine

## 2016-10-07 ENCOUNTER — Other Ambulatory Visit: Payer: Self-pay

## 2016-10-07 ENCOUNTER — Encounter (HOSPITAL_COMMUNITY)
Admission: RE | Admit: 2016-10-07 | Discharge: 2016-10-07 | Disposition: A | Discharge status: Home / Self Care | Payer: Medicare Other | Source: Skilled Nursing Facility | Attending: Internal Medicine | Admitting: Internal Medicine

## 2016-10-07 ENCOUNTER — Encounter: Payer: Self-pay | Admitting: Internal Medicine

## 2016-10-07 DIAGNOSIS — N184 Chronic kidney disease, stage 4 (severe): Secondary | ICD-10-CM | POA: Diagnosis not present

## 2016-10-07 DIAGNOSIS — Z79899 Other long term (current) drug therapy: Secondary | ICD-10-CM | POA: Diagnosis not present

## 2016-10-07 DIAGNOSIS — I5032 Chronic diastolic (congestive) heart failure: Secondary | ICD-10-CM | POA: Diagnosis not present

## 2016-10-07 DIAGNOSIS — E119 Type 2 diabetes mellitus without complications: Secondary | ICD-10-CM

## 2016-10-07 DIAGNOSIS — I482 Chronic atrial fibrillation, unspecified: Secondary | ICD-10-CM

## 2016-10-07 DIAGNOSIS — R279 Unspecified lack of coordination: Secondary | ICD-10-CM | POA: Diagnosis not present

## 2016-10-07 DIAGNOSIS — M6281 Muscle weakness (generalized): Secondary | ICD-10-CM | POA: Diagnosis not present

## 2016-10-07 DIAGNOSIS — J189 Pneumonia, unspecified organism: Secondary | ICD-10-CM | POA: Diagnosis not present

## 2016-10-07 DIAGNOSIS — Z7901 Long term (current) use of anticoagulants: Secondary | ICD-10-CM | POA: Diagnosis not present

## 2016-10-07 LAB — BASIC METABOLIC PANEL
ANION GAP: 8 (ref 5–15)
BUN: 76 mg/dL — ABNORMAL HIGH (ref 6–20)
CHLORIDE: 99 mmol/L — AB (ref 101–111)
CO2: 27 mmol/L (ref 22–32)
Calcium: 8.9 mg/dL (ref 8.9–10.3)
Creatinine, Ser: 1.92 mg/dL — ABNORMAL HIGH (ref 0.44–1.00)
GFR calc Af Amer: 26 mL/min — ABNORMAL LOW (ref >60)
GFR, EST NON AFRICAN AMERICAN: 23 mL/min — AB (ref >60)
GLUCOSE: 430 mg/dL — AB (ref 65–99)
POTASSIUM: 4.8 mmol/L (ref 3.5–5.1)
SODIUM: 134 mmol/L — AB (ref 135–145)

## 2016-10-07 LAB — PROTIME-INR
INR: 2.34
PROTHROMBIN TIME: 26.1 s — AB (ref 11.4–15.2)

## 2016-10-07 LAB — FOLATE: Folate: 6.4 ng/mL (ref >5.9)

## 2016-10-07 LAB — FERRITIN: FERRITIN: 226 ng/mL (ref 11–307)

## 2016-10-07 LAB — CBC
HCT: 30.3 % — ABNORMAL LOW (ref 36.0–46.0)
HEMOGLOBIN: 9.7 g/dL — AB (ref 12.0–15.0)
MCH: 29.7 pg (ref 26.0–34.0)
MCHC: 32 g/dL (ref 30.0–36.0)
MCV: 92.7 fL (ref 78.0–100.0)
PLATELETS: 213 10*3/uL (ref 150–400)
RBC: 3.27 MIL/uL — AB (ref 3.87–5.11)
RDW: 16.9 % — ABNORMAL HIGH (ref 11.5–15.5)
WBC: 9.1 10*3/uL (ref 4.0–10.5)

## 2016-10-07 LAB — IRON: IRON: 68 ug/dL (ref 28–170)

## 2016-10-07 LAB — VITAMIN B12: VITAMIN B 12: 745 pg/mL (ref 180–914)

## 2016-10-07 NOTE — Patient Outreach (Signed)
[facility] Care Management  10/07/2016  Glenda SowSarah T Dickson June 17, 1928 [id number]   Referral per MD office-concerned about hygeine. RNCM called for telephonic screening. Noted client is at [facility].   Plan: RNCM will refer to [facility] social work for follow up.  Glenda SheriffJuana Tateanna Bach, RN, MSN, [facility],CCM [facility] Community Care Coordinator Cell: [phone]

## 2016-10-07 NOTE — Progress Notes (Signed)
Location:   [facility] Room Number: 152/D Place of Service:  SNF (31) Provider:  Silverio DecampAnjali,Gupta  Zack Hall, MD  Patient Care Team: Benita StabileJohn Z Hall, MD as PCP - General (Internal Medicine) Kathlen Brunswickobert M Rothbart, MD (Cardiology)  Extended Emergency Contact Information Primary Emergency Contact: Forrest MoronJarrett,Teresa Address: [address]          [place] [id number] [place] Home Phone: [phone] Relation: Daughter Secondary Emergency Contact: Burr MedicoWalker,Van  [place] Home Phone: [phone] Relation: Son  Code Status:  Full Code Goals of care: Advanced Directive information Advanced Directives 10/07/2016  Does Patient Have a Medical Advance Directive? Yes  Type of Advance Directive (No Data)  Does patient want to make changes to medical advance directive? No - Patient declined  Copy of Healthcare Power of Attorney in Chart? -  Would patient like information on creating a medical advance directive? -     Chief Complaint  Patient presents with  . Acute Visit    BS unstable    HPI:  Pt is a 83 y.o. female seen today for an acute visit for Blood sugars running high.  Patient has h/o Chronic Atrial fibrillation, Anemia, Hypertension, Type 2 Diabetes, Chronic Renal disease . Her BS have been running high in the facility. Her sugars throughout the day are n=more then 200-300. Patient says her BS at home have never been this high. Her A1C on Glipizide was 5.8. On reviewing patients chart she was started on prednisone on discharge but it seems that she was not taking any prednisone at home. Her prednisone was started by ED Physician for Gout. Her symptoms of gout ar not resolved. Also Patient Swelling and Redness in the legs is much better and almost resolved. She is not having any Chest pain or SOB.    Past Medical History:  Diagnosis Date  . Anemia, normocytic normochromic 06/26/2012   H&H-11.2/33.7, normal MCV in 06/2012   . Atrial  fibrillation (HCC)   . Depression   . Diabetes mellitus type II    no insulin  . Fracture of left hip (HCC) 2006   intertrochanteric; ORIF  . Hyperlipidemia   . Hypertension    Past Surgical History:  Procedure Laterality Date  . ABDOMINAL HYSTERECTOMY    . COLONOSCOPY  2003   Negative screening study.  Drucilla Chalet. INTERTROCHANTERIC HIP FRACTURE SURGERY     Fixation    Allergies  Allergen Reactions  . Benzalkonium Chloride-Alcohol Other (See Comments)    Patient/family is not familiar with this allergy  . Codeine Itching and Nausea Only    Current Outpatient Prescriptions on File Prior to Visit  Medication Sig Dispense Refill  . atorvastatin (LIPITOR) 40 MG tablet Take 40 mg by mouth every morning.     . Calcium Citrate-Vitamin D (CITRACAL + D PO) Take 1 tablet by mouth daily. D3    . ferrous sulfate 325 (65 FE) MG tablet Take 325 mg by mouth every evening.   5  . furosemide (LASIX) 40 MG tablet Take 1 tablet (40 mg total) by mouth daily. 30 tablet 0  . glipiZIDE (GLUCOTROL XL) 2.5 MG 24 hr tablet Take 2.5 mg by mouth every evening.     Marland Kitchen. levofloxacin (LEVAQUIN) 500 MG tablet Take 1 tablet (500 mg total) by mouth every other day. 3 tablet 0  . metoprolol (LOPRESSOR) 50 MG tablet Take 1 tablet (50 mg total) by mouth 2 (two) times daily. 180 tablet 3  . Multiple Vitamin (  MULTIVITAMIN) tablet Take 1 tablet by mouth daily.    Marland Kitchen PARoxetine (PAXIL) 20 MG tablet Take 20 mg by mouth every morning.     . potassium chloride (K-DUR,KLOR-CON) 20 MEQ tablet Take 1 tablet (20 mEq total) by mouth daily. 30 tablet 0  . ramipril (ALTACE) 5 MG capsule TAKE 1 CAPSULE BY MOUTH DAILY 90 capsule 0   No current facility-administered medications on file prior to visit.      Review of Systems  Constitutional: Negative for activity change, appetite change, chills, diaphoresis, fatigue and fever.  Respiratory: Negative.  Negative for apnea, cough, chest tightness and shortness of breath.   Cardiovascular:  Negative for chest pain, palpitations and leg swelling.  Gastrointestinal: Negative for constipation and diarrhea.  Genitourinary: Negative for dysuria and frequency.  Musculoskeletal: Negative.   Skin: Negative.   Neurological: Negative.   Psychiatric/Behavioral: Negative.     There is no immunization history for the selected administration types on file for this patient. Pertinent  Health Maintenance Due  Topic Date Due  . FOOT EXAM  [birth year]02/1938  . OPHTHALMOLOGY EXAM  08[birth year]02/1938  . URINE MICROALBUMIN  08/02/1940  . DEXA SCAN  08/03/1995  . INFLUENZA VACCINE  08/23/2017 (Originally 04/23/2016)  . PNA vac Low Risk Adult (1 of 2 - PCV13) 08/23/2017 (Originally 08/03/1995)  . HEMOGLOBIN A1C  03/29/2017   No flowsheet data found. Functional Status Survey:    Vitals:   10/07/16 1307  BP: 118/61  Pulse: 63  Resp: 20  Temp: 98.8 F (37.1 C)  TempSrc: Oral   There is no height or weight on file to calculate BMI. Physical Exam  Constitutional: She is oriented to person, place, and time. She appears well-developed and well-nourished.  HENT:  Head: Normocephalic.  Mouth/Throat: Oropharynx is clear and moist.  Eyes: Pupils are equal, round, and reactive to light.  Neck: Neck supple.  Cardiovascular: Normal rate, regular rhythm and normal heart sounds.   Pulmonary/Chest: Effort normal and breath sounds normal. She has no wheezes. She has no rales.  Abdominal: Soft. Bowel sounds are normal. She exhibits no distension. There is no tenderness. There is no rebound.  Musculoskeletal:  Mild Edema in Both legs.  Neurological: She is alert and oriented to person, place, and time.  Skin:  Redness in LE resolved.  Psychiatric: She has a normal mood and affect. Her behavior is normal. Judgment and thought content normal.    Labs reviewed:  Recent Labs  10/01/16 0624 10/02/16 0634 10/03/16 0500 10/07/16 0730  NA 139 140 138 134*  K 3.3* 3.1* 4.5 4.8  CL 105 104 105 99*  CO2 27 29  26 27   GLUCOSE 120* 146* 225* 430*  BUN 39* 41* 46* 76*  CREATININE 1.57* 1.53* 1.51* 1.92*  CALCIUM 7.9* 7.9* 8.6* 8.9  MG 1.5*  --   --   --     Recent Labs  10/23/15 1940 09/29/16 1632 09/30/16 0513  AST 18 24 21   ALT 13* 16 14  ALKPHOS 85 132* 98  BILITOT 1.9* 1.9* 1.1  PROT 6.5 6.1* 5.1*  ALBUMIN 3.8 2.7* 2.5*    Recent Labs  10/23/15 1940 07/17/16 1726 09/29/16 1632 10/03/16 0500 10/07/16 0730  WBC 6.0 6.8 10.6* 6.2 9.1  NEUTROABS 4.6 4.7 8.2*  --   --   HGB 10.2* 10.9* 10.1* 8.9* 9.7*  HCT 31.8* 33.5* 31.7* 27.6* 30.3*  MCV 93.0 88.2 93.2 91.1 92.7  PLT 148* 143* 192 161 213   Lab Results  Component Value Date   TSH 5.046 (H) 06/25/2012   Lab Results  Component Value Date   HGBA1C 5.8 (H) 09/29/2016   Lab Results  Component Value Date   CHOL 101 10/23/2015   HDL 30 (L) 10/23/2015   LDLCALC 52 10/23/2015   TRIG 96 10/23/2015   CHOLHDL 3.4 10/23/2015    Significant Diagnostic Results in last 30 days:  Dg Chest Port 1 View  Result Date: 09/29/2016 CLINICAL DATA:  83 year old with progressive generalized weakness over the past 2 weeks associated with bilateral lower extremity weeping edema. EXAM: PORTABLE CHEST 1 VIEW COMPARISON:  None. FINDINGS: Cardiac silhouette mildly enlarged for the AP portable technique, unchanged. Thoracic aorta atherosclerotic, unchanged. Hilar and mediastinal contours otherwise unremarkable. Dense consolidation in the left lower lobe associated with a left pleural effusion. Lungs otherwise clear. No visible right pleural effusion. Pulmonary vascularity normal. Severe degenerative changes involving the right shoulder joint with dense calcifications involving the capsule and/or the subacromial bursa. IMPRESSION: 1. Left lower lobe pneumonia and associated left pleural effusion. 2. Stable mild cardiomegaly without evidence of pulmonary edema. 3. Thoracic aortic atherosclerosis. Electronically Signed   By: Hulan Saas M.D.   On:  09/29/2016 17:13    Assessment/Plan Diabetes mellitus type 2 Since patient was not taking Prednisone at home her BS have started running high as she was discharged on prednisone from the hospital. Will Taper her fast from Prednisone. Continue accu check. Sliding scale for Bs. Do not want to increase Glipizide as worried her BS will run low if of Prednisone. Reevaluate once she is off Prednisone.  Chronic diastolic CHF (congestive heart failure) (HCC) Patient BUN is elevated . Her weight is stable but her lasix was restarted recently. Will hold Lasix. Continue to monitor her clinically. Repeat BMP in few days to follow  BUN.  Chronic kidney disease (CKD), stage IV (severe) Her BUN and Creat is elevated. Will Hold Lasix  Chronic atrial fibrillation (HCC) INR was therapeutic. Continue same dose of Coumadin. Follow INR.  Anemia Waiting for Heme occult But HGB is stable . Repeat CBC  Chronic venous stasis with Rash Resolved mostly right now.    Family/ staff Communication:   Labs/tests ordered:  Total time spent in this patient care encounter was 40_ minutes; greater than 50% of the visit spent counseling patient and coordinating care for problems addressed at this encounter.

## 2016-10-08 ENCOUNTER — Encounter: Payer: Self-pay | Admitting: Licensed Clinical Social Worker

## 2016-10-08 ENCOUNTER — Other Ambulatory Visit: Payer: Self-pay | Admitting: Licensed Clinical Social Worker

## 2016-10-08 NOTE — Patient Outreach (Signed)
[facility]) Care Management  [facility] Social Work  10/08/2016  Glenda Dickson 05-18-1928 [id number]  Subjective:    Objective:   Encounter Medications:  Outpatient Encounter Prescriptions as of 10/08/2016  Medication Sig  . atorvastatin (LIPITOR) 40 MG tablet Take 40 mg by mouth every morning.   . Calcium Citrate-Vitamin D (CITRACAL + D PO) Take 1 tablet by mouth daily. D3  . collagenase (SANTYL) ointment Apply to sacral wound per tx order  . ferrous sulfate 325 (65 FE) MG tablet Take 325 mg by mouth every evening.   . furosemide (LASIX) 40 MG tablet Take 1 tablet (40 mg total) by mouth daily.  Marland Kitchen glipiZIDE (GLUCOTROL XL) 2.5 MG 24 hr tablet Take 2.5 mg by mouth every evening.   Marland Kitchen levofloxacin (LEVAQUIN) 500 MG tablet Take 1 tablet (500 mg total) by mouth every other day.  . metoprolol (LOPRESSOR) 50 MG tablet Take 1 tablet (50 mg total) by mouth 2 (two) times daily.  . Multiple Vitamin (MULTIVITAMIN) tablet Take 1 tablet by mouth daily.  Marland Kitchen PARoxetine (PAXIL) 20 MG tablet Take 20 mg by mouth every morning.   . potassium chloride (K-DUR,KLOR-CON) 20 MEQ tablet Take 1 tablet (20 mEq total) by mouth daily.  . predniSONE (DELTASONE) 5 MG tablet Take 3 tablets 15 mg by mouth twice a day from 10/04/16-10/08/16. Take 2 tablets 10 mg by mouth twice a day from 10/09/16-10/13/16. Take 1 tablet 5 mg by mouth twice a day from 10/14/16-10/18/16.  . ramipril (ALTACE) 5 MG capsule TAKE 1 CAPSULE BY MOUTH DAILY  . warfarin (COUMADIN) 3 MG tablet Take 3 mg by mouth every evening.   No facility-administered encounter medications on file as of 10/08/2016.     Functional Status:  In your present state of health, do you have any difficulty performing the following activities: 10/01/2016 09/29/2016  Hearing? N N  Vision? N N  Difficulty concentrating or making decisions? N N  Walking or climbing stairs? Y Y  Dressing or bathing? N Y  Doing errands, shopping? N N  Some recent data might be  hidden    Fall/Depression Screening:  No flowsheet data found.  Assessment:   CSW received referral on Glenda Dickson. CSW completed chart review on client on 10/08/16.  Client is currently receiving care at [facility] in [place], [place].  She admitted to [facility] for care on 10/03/16.  CSW traveled to [facility] in [place], [place] on 10/08/16. CSW met with client on 10/08/16 at [facility] at room of client. CSW verified client identity. CSW received verbal permission from client on 10/08/16 for CSW to talk with client about current needs of client and about current status of client. CSW gave client [facility] CSW card.   Patient assessed in  [place] for continued care needs. CSW will continue to collaborate with the skilled nursing facility social worker to facilitate discharge planning needs and communicate with the patient and family.  CSW and client spoke of fact that client sees Dr. Nevada Crane as her primary doctor in the community. Client said she is receiving nursing care at facility. She said she is receiving physical therapy sessions as scheduled for client at facility.  Client and CSW completed needed [facility] assessments for client.  Client said she does wear glasses to help with vision. She said she has several walkers and several canes. She said she has support from her daughter and from her son.  She is hoping  to be at facility for short term care. She hopes to eventually be able to discharge home, when able, with needed supports in place. CSW and client spoke of client care plan. CSW encouraged client to participate in all scheduled client physical therapy sessions for client in next 30 days at facility. Client said she is eating adequately and is sleeping well. CSW encouraged client or her son or daughter to call CSW at [phone] as needed to discuss social work needs of client.  CSW thanked client for allowing CSW to visit her at her room at [facility] on 10/08/16.  Client was appreciative of visit by CSW on 10/08/16.   Plan:   Client to participate in all scheduled client physical therapy sessions for client in next 30 days at facility.  CSW to call client in 3 weeks to assess client needs.    Norva Riffle.Khiyan Crace MSW, LCSW Licensed Clinical Social Worker [facility] Care Management [phone]

## 2016-10-09 ENCOUNTER — Encounter (HOSPITAL_COMMUNITY)
Admission: RE | Admit: 2016-10-09 | Discharge: 2016-10-09 | Disposition: A | Discharge status: Home / Self Care | Payer: Medicare Other | Source: Skilled Nursing Facility | Attending: Internal Medicine | Admitting: Internal Medicine

## 2016-10-09 DIAGNOSIS — M6281 Muscle weakness (generalized): Secondary | ICD-10-CM | POA: Diagnosis not present

## 2016-10-09 DIAGNOSIS — Z7901 Long term (current) use of anticoagulants: Secondary | ICD-10-CM | POA: Diagnosis not present

## 2016-10-09 DIAGNOSIS — I5032 Chronic diastolic (congestive) heart failure: Secondary | ICD-10-CM | POA: Diagnosis not present

## 2016-10-09 DIAGNOSIS — Z79899 Other long term (current) drug therapy: Secondary | ICD-10-CM | POA: Diagnosis not present

## 2016-10-09 DIAGNOSIS — J189 Pneumonia, unspecified organism: Secondary | ICD-10-CM | POA: Diagnosis not present

## 2016-10-09 DIAGNOSIS — N184 Chronic kidney disease, stage 4 (severe): Secondary | ICD-10-CM | POA: Diagnosis not present

## 2016-10-09 LAB — BASIC METABOLIC PANEL
ANION GAP: 7 (ref 5–15)
BUN: 79 mg/dL — ABNORMAL HIGH (ref 6–20)
CHLORIDE: 99 mmol/L — AB (ref 101–111)
CO2: 28 mmol/L (ref 22–32)
Calcium: 9.2 mg/dL (ref 8.9–10.3)
Creatinine, Ser: 1.86 mg/dL — ABNORMAL HIGH (ref 0.44–1.00)
GFR calc Af Amer: 27 mL/min — ABNORMAL LOW (ref >60)
GFR calc non Af Amer: 23 mL/min — ABNORMAL LOW (ref >60)
GLUCOSE: 291 mg/dL — AB (ref 65–99)
POTASSIUM: 5.1 mmol/L (ref 3.5–5.1)
Sodium: 134 mmol/L — ABNORMAL LOW (ref 135–145)

## 2016-10-09 LAB — CBC
HEMATOCRIT: 30.3 % — AB (ref 36.0–46.0)
HEMOGLOBIN: 10 g/dL — AB (ref 12.0–15.0)
MCH: 30 pg (ref 26.0–34.0)
MCHC: 33 g/dL (ref 30.0–36.0)
MCV: 91 fL (ref 78.0–100.0)
Platelets: 192 10*3/uL (ref 150–400)
RBC: 3.33 MIL/uL — ABNORMAL LOW (ref 3.87–5.11)
RDW: 17.3 % — ABNORMAL HIGH (ref 11.5–15.5)
WBC: 8.1 10*3/uL (ref 4.0–10.5)

## 2016-10-09 LAB — PROTIME-INR
INR: 2.34
Prothrombin Time: 26.1 seconds — ABNORMAL HIGH (ref 11.4–15.2)

## 2016-10-10 DIAGNOSIS — R279 Unspecified lack of coordination: Secondary | ICD-10-CM | POA: Diagnosis not present

## 2016-10-10 DIAGNOSIS — N184 Chronic kidney disease, stage 4 (severe): Secondary | ICD-10-CM | POA: Diagnosis not present

## 2016-10-10 DIAGNOSIS — J189 Pneumonia, unspecified organism: Secondary | ICD-10-CM | POA: Diagnosis not present

## 2016-10-10 DIAGNOSIS — M6281 Muscle weakness (generalized): Secondary | ICD-10-CM | POA: Diagnosis not present

## 2016-10-12 DIAGNOSIS — R279 Unspecified lack of coordination: Secondary | ICD-10-CM | POA: Diagnosis not present

## 2016-10-12 DIAGNOSIS — M6281 Muscle weakness (generalized): Secondary | ICD-10-CM | POA: Diagnosis not present

## 2016-10-12 DIAGNOSIS — J189 Pneumonia, unspecified organism: Secondary | ICD-10-CM | POA: Diagnosis not present

## 2016-10-12 DIAGNOSIS — N184 Chronic kidney disease, stage 4 (severe): Secondary | ICD-10-CM | POA: Diagnosis not present

## 2016-10-14 ENCOUNTER — Encounter (HOSPITAL_COMMUNITY)
Admission: RE | Admit: 2016-10-14 | Discharge: 2016-10-14 | Disposition: A | Discharge status: Home / Self Care | Payer: Medicare Other | Source: Skilled Nursing Facility | Attending: Internal Medicine | Admitting: Internal Medicine

## 2016-10-14 DIAGNOSIS — Z79899 Other long term (current) drug therapy: Secondary | ICD-10-CM | POA: Diagnosis not present

## 2016-10-14 DIAGNOSIS — J189 Pneumonia, unspecified organism: Secondary | ICD-10-CM | POA: Insufficient documentation

## 2016-10-14 DIAGNOSIS — Z7984 Long term (current) use of oral hypoglycemic drugs: Secondary | ICD-10-CM | POA: Diagnosis not present

## 2016-10-14 DIAGNOSIS — R600 Localized edema: Secondary | ICD-10-CM | POA: Insufficient documentation

## 2016-10-14 DIAGNOSIS — Z7952 Long term (current) use of systemic steroids: Secondary | ICD-10-CM | POA: Insufficient documentation

## 2016-10-14 DIAGNOSIS — I482 Chronic atrial fibrillation: Secondary | ICD-10-CM | POA: Insufficient documentation

## 2016-10-14 LAB — PROTIME-INR
INR: 1.86
PROTHROMBIN TIME: 21.7 s — AB (ref 11.4–15.2)

## 2016-10-14 LAB — FOLATE RBC
FOLATE, HEMOLYSATE: 371.2 ng/mL
FOLATE, RBC: UNDETERMINED ng/mL

## 2016-10-16 DIAGNOSIS — N184 Chronic kidney disease, stage 4 (severe): Secondary | ICD-10-CM | POA: Diagnosis not present

## 2016-10-16 DIAGNOSIS — M6281 Muscle weakness (generalized): Secondary | ICD-10-CM | POA: Diagnosis not present

## 2016-10-16 DIAGNOSIS — J189 Pneumonia, unspecified organism: Secondary | ICD-10-CM | POA: Diagnosis not present

## 2016-10-16 DIAGNOSIS — R279 Unspecified lack of coordination: Secondary | ICD-10-CM | POA: Diagnosis not present

## 2016-10-17 ENCOUNTER — Encounter (HOSPITAL_COMMUNITY)
Admission: RE | Admit: 2016-10-17 | Discharge: 2016-10-17 | Disposition: A | Discharge status: Home / Self Care | Payer: Medicare Other | Source: Skilled Nursing Facility | Attending: Internal Medicine | Admitting: Internal Medicine

## 2016-10-17 DIAGNOSIS — R279 Unspecified lack of coordination: Secondary | ICD-10-CM | POA: Diagnosis not present

## 2016-10-17 DIAGNOSIS — Z7952 Long term (current) use of systemic steroids: Secondary | ICD-10-CM | POA: Insufficient documentation

## 2016-10-17 DIAGNOSIS — I482 Chronic atrial fibrillation: Secondary | ICD-10-CM | POA: Insufficient documentation

## 2016-10-17 DIAGNOSIS — R600 Localized edema: Secondary | ICD-10-CM | POA: Diagnosis not present

## 2016-10-17 DIAGNOSIS — N184 Chronic kidney disease, stage 4 (severe): Secondary | ICD-10-CM | POA: Diagnosis not present

## 2016-10-17 DIAGNOSIS — Z79899 Other long term (current) drug therapy: Secondary | ICD-10-CM | POA: Diagnosis not present

## 2016-10-17 DIAGNOSIS — Z7984 Long term (current) use of oral hypoglycemic drugs: Secondary | ICD-10-CM | POA: Diagnosis not present

## 2016-10-17 DIAGNOSIS — J189 Pneumonia, unspecified organism: Secondary | ICD-10-CM | POA: Diagnosis not present

## 2016-10-17 DIAGNOSIS — M6281 Muscle weakness (generalized): Secondary | ICD-10-CM | POA: Diagnosis not present

## 2016-10-17 LAB — PROTIME-INR
INR: 1.93
Prothrombin Time: 22.3 seconds — ABNORMAL HIGH (ref 11.4–15.2)

## 2016-10-18 DIAGNOSIS — R279 Unspecified lack of coordination: Secondary | ICD-10-CM | POA: Diagnosis not present

## 2016-10-18 DIAGNOSIS — M6281 Muscle weakness (generalized): Secondary | ICD-10-CM | POA: Diagnosis not present

## 2016-10-18 DIAGNOSIS — N184 Chronic kidney disease, stage 4 (severe): Secondary | ICD-10-CM | POA: Diagnosis not present

## 2016-10-18 DIAGNOSIS — J189 Pneumonia, unspecified organism: Secondary | ICD-10-CM | POA: Diagnosis not present

## 2016-10-18 LAB — BASIC METABOLIC PANEL
ANION GAP: 7 (ref 5–15)
BUN: 53 mg/dL — AB (ref 6–20)
CALCIUM: 9 mg/dL (ref 8.9–10.3)
CO2: 26 mmol/L (ref 22–32)
Chloride: 106 mmol/L (ref 101–111)
Creatinine, Ser: 1.78 mg/dL — ABNORMAL HIGH (ref 0.44–1.00)
GFR calc Af Amer: 29 mL/min — ABNORMAL LOW (ref >60)
GFR calc non Af Amer: 25 mL/min — ABNORMAL LOW (ref >60)
GLUCOSE: 115 mg/dL — AB (ref 65–99)
Potassium: 4.8 mmol/L (ref 3.5–5.1)
Sodium: 139 mmol/L (ref 135–145)

## 2016-10-21 ENCOUNTER — Encounter (HOSPITAL_COMMUNITY)
Admission: RE | Admit: 2016-10-21 | Discharge: 2016-10-21 | Disposition: A | Discharge status: Home / Self Care | Payer: Medicare Other | Source: Skilled Nursing Facility | Attending: *Deleted | Admitting: *Deleted

## 2016-10-21 DIAGNOSIS — M6281 Muscle weakness (generalized): Secondary | ICD-10-CM | POA: Diagnosis not present

## 2016-10-21 DIAGNOSIS — R279 Unspecified lack of coordination: Secondary | ICD-10-CM | POA: Diagnosis not present

## 2016-10-21 DIAGNOSIS — Z7901 Long term (current) use of anticoagulants: Secondary | ICD-10-CM | POA: Diagnosis not present

## 2016-10-21 DIAGNOSIS — J189 Pneumonia, unspecified organism: Secondary | ICD-10-CM | POA: Diagnosis not present

## 2016-10-21 DIAGNOSIS — I5032 Chronic diastolic (congestive) heart failure: Secondary | ICD-10-CM | POA: Diagnosis not present

## 2016-10-21 DIAGNOSIS — Z79899 Other long term (current) drug therapy: Secondary | ICD-10-CM | POA: Diagnosis not present

## 2016-10-21 DIAGNOSIS — N184 Chronic kidney disease, stage 4 (severe): Secondary | ICD-10-CM | POA: Diagnosis not present

## 2016-10-21 LAB — BASIC METABOLIC PANEL
Anion gap: 8 (ref 5–15)
BUN: 49 mg/dL — ABNORMAL HIGH (ref 6–20)
CHLORIDE: 106 mmol/L (ref 101–111)
CO2: 27 mmol/L (ref 22–32)
CREATININE: 1.56 mg/dL — AB (ref 0.44–1.00)
Calcium: 9 mg/dL (ref 8.9–10.3)
GFR, EST AFRICAN AMERICAN: 34 mL/min — AB (ref >60)
GFR, EST NON AFRICAN AMERICAN: 29 mL/min — AB (ref >60)
Glucose, Bld: 95 mg/dL (ref 65–99)
POTASSIUM: 4.5 mmol/L (ref 3.5–5.1)
SODIUM: 141 mmol/L (ref 135–145)

## 2016-10-21 LAB — PROTIME-INR
INR: 3.18
PROTHROMBIN TIME: 33.3 s — AB (ref 11.4–15.2)

## 2016-10-22 ENCOUNTER — Encounter (HOSPITAL_COMMUNITY)
Admission: RE | Admit: 2016-10-22 | Discharge: 2016-10-22 | Disposition: A | Discharge status: Home / Self Care | Payer: Medicare Other | Source: Skilled Nursing Facility | Attending: Internal Medicine | Admitting: Internal Medicine

## 2016-10-22 DIAGNOSIS — Z7984 Long term (current) use of oral hypoglycemic drugs: Secondary | ICD-10-CM | POA: Insufficient documentation

## 2016-10-22 DIAGNOSIS — I482 Chronic atrial fibrillation: Secondary | ICD-10-CM | POA: Insufficient documentation

## 2016-10-22 DIAGNOSIS — Z79899 Other long term (current) drug therapy: Secondary | ICD-10-CM | POA: Insufficient documentation

## 2016-10-22 DIAGNOSIS — R600 Localized edema: Secondary | ICD-10-CM | POA: Diagnosis not present

## 2016-10-22 DIAGNOSIS — J189 Pneumonia, unspecified organism: Secondary | ICD-10-CM | POA: Diagnosis not present

## 2016-10-22 DIAGNOSIS — N184 Chronic kidney disease, stage 4 (severe): Secondary | ICD-10-CM | POA: Diagnosis not present

## 2016-10-22 DIAGNOSIS — R279 Unspecified lack of coordination: Secondary | ICD-10-CM | POA: Diagnosis not present

## 2016-10-22 DIAGNOSIS — Z7952 Long term (current) use of systemic steroids: Secondary | ICD-10-CM | POA: Insufficient documentation

## 2016-10-22 DIAGNOSIS — M6281 Muscle weakness (generalized): Secondary | ICD-10-CM | POA: Diagnosis not present

## 2016-10-22 LAB — URINALYSIS, ROUTINE W REFLEX MICROSCOPIC
BILIRUBIN URINE: NEGATIVE
Bacteria, UA: NONE SEEN
Glucose, UA: NEGATIVE mg/dL
KETONES UR: NEGATIVE mg/dL
Nitrite: NEGATIVE
Protein, ur: NEGATIVE mg/dL
Specific Gravity, Urine: 1.017 (ref 1.005–1.030)
WBC UA: NONE SEEN WBC/hpf (ref 0–5)
pH: 5 (ref 5.0–8.0)

## 2016-10-22 LAB — PROTIME-INR
INR: 2.85
Prothrombin Time: 30.5 seconds — ABNORMAL HIGH (ref 11.4–15.2)

## 2016-10-23 ENCOUNTER — Emergency Department (HOSPITAL_COMMUNITY): Payer: Medicare Other

## 2016-10-23 ENCOUNTER — Inpatient Hospital Stay
Admission: RE | Admit: 2016-10-23 | Discharge: 2016-10-30 | Disposition: A | Discharge status: Home / Self Care | Payer: Medicare Other | Source: Ambulatory Visit | Attending: Internal Medicine | Admitting: Internal Medicine

## 2016-10-23 ENCOUNTER — Encounter: Payer: Self-pay | Admitting: Internal Medicine

## 2016-10-23 ENCOUNTER — Non-Acute Institutional Stay (SKILLED_NURSING_FACILITY): Payer: Medicare Other | Admitting: Internal Medicine

## 2016-10-23 ENCOUNTER — Emergency Department (HOSPITAL_COMMUNITY)
Admission: EM | Admit: 2016-10-23 | Discharge: 2016-10-23 | Disposition: A | Discharge status: Home / Self Care | Payer: Medicare Other | Attending: Emergency Medicine | Admitting: Emergency Medicine

## 2016-10-23 ENCOUNTER — Encounter (HOSPITAL_COMMUNITY): Payer: Self-pay | Admitting: Emergency Medicine

## 2016-10-23 DIAGNOSIS — I5032 Chronic diastolic (congestive) heart failure: Secondary | ICD-10-CM | POA: Insufficient documentation

## 2016-10-23 DIAGNOSIS — I482 Chronic atrial fibrillation, unspecified: Secondary | ICD-10-CM

## 2016-10-23 DIAGNOSIS — N184 Chronic kidney disease, stage 4 (severe): Secondary | ICD-10-CM

## 2016-10-23 DIAGNOSIS — Z79899 Other long term (current) drug therapy: Secondary | ICD-10-CM | POA: Diagnosis not present

## 2016-10-23 DIAGNOSIS — R410 Disorientation, unspecified: Secondary | ICD-10-CM | POA: Diagnosis not present

## 2016-10-23 DIAGNOSIS — Z794 Long term (current) use of insulin: Secondary | ICD-10-CM | POA: Insufficient documentation

## 2016-10-23 DIAGNOSIS — R4182 Altered mental status, unspecified: Secondary | ICD-10-CM

## 2016-10-23 DIAGNOSIS — N390 Urinary tract infection, site not specified: Secondary | ICD-10-CM

## 2016-10-23 DIAGNOSIS — I679 Cerebrovascular disease, unspecified: Secondary | ICD-10-CM | POA: Diagnosis not present

## 2016-10-23 DIAGNOSIS — I130 Hypertensive heart and chronic kidney disease with heart failure and stage 1 through stage 4 chronic kidney disease, or unspecified chronic kidney disease: Secondary | ICD-10-CM | POA: Diagnosis not present

## 2016-10-23 DIAGNOSIS — E1122 Type 2 diabetes mellitus with diabetic chronic kidney disease: Secondary | ICD-10-CM | POA: Diagnosis not present

## 2016-10-23 DIAGNOSIS — J90 Pleural effusion, not elsewhere classified: Secondary | ICD-10-CM | POA: Diagnosis not present

## 2016-10-23 LAB — URINALYSIS, ROUTINE W REFLEX MICROSCOPIC
BILIRUBIN URINE: NEGATIVE
Glucose, UA: NEGATIVE mg/dL
KETONES UR: NEGATIVE mg/dL
NITRITE: NEGATIVE
Protein, ur: NEGATIVE mg/dL
Specific Gravity, Urine: 1.016 (ref 1.005–1.030)
pH: 5 (ref 5.0–8.0)

## 2016-10-23 LAB — COMPREHENSIVE METABOLIC PANEL
ALBUMIN: 3.2 g/dL — AB (ref 3.5–5.0)
ALT: 23 U/L (ref 14–54)
AST: 28 U/L (ref 15–41)
Alkaline Phosphatase: 124 U/L (ref 38–126)
Anion gap: 9 (ref 5–15)
BUN: 46 mg/dL — AB (ref 6–20)
CHLORIDE: 105 mmol/L (ref 101–111)
CO2: 25 mmol/L (ref 22–32)
Calcium: 9 mg/dL (ref 8.9–10.3)
Creatinine, Ser: 1.49 mg/dL — ABNORMAL HIGH (ref 0.44–1.00)
GFR calc Af Amer: 35 mL/min — ABNORMAL LOW (ref >60)
GFR, EST NON AFRICAN AMERICAN: 31 mL/min — AB (ref >60)
Glucose, Bld: 164 mg/dL — ABNORMAL HIGH (ref 65–99)
POTASSIUM: 4.4 mmol/L (ref 3.5–5.1)
Sodium: 139 mmol/L (ref 135–145)
Total Bilirubin: 2 mg/dL — ABNORMAL HIGH (ref 0.3–1.2)
Total Protein: 6 g/dL — ABNORMAL LOW (ref 6.5–8.1)

## 2016-10-23 LAB — LACTIC ACID, PLASMA
Lactic Acid, Venous: 1.5 mmol/L (ref 0.5–1.9)
Lactic Acid, Venous: 1.8 mmol/L (ref 0.5–1.9)

## 2016-10-23 LAB — CBC WITH DIFFERENTIAL/PLATELET
Basophils Absolute: 0 10*3/uL (ref 0.0–0.1)
Basophils Relative: 0 %
EOS PCT: 3 %
Eosinophils Absolute: 0.2 10*3/uL (ref 0.0–0.7)
HEMATOCRIT: 33.7 % — AB (ref 36.0–46.0)
HEMOGLOBIN: 10.8 g/dL — AB (ref 12.0–15.0)
LYMPHS PCT: 25 %
Lymphs Abs: 1.4 10*3/uL (ref 0.7–4.0)
MCH: 31 pg (ref 26.0–34.0)
MCHC: 32 g/dL (ref 30.0–36.0)
MCV: 96.8 fL (ref 78.0–100.0)
Monocytes Absolute: 0.3 10*3/uL (ref 0.1–1.0)
Monocytes Relative: 6 %
NEUTROS ABS: 3.8 10*3/uL (ref 1.7–7.7)
Neutrophils Relative %: 67 %
PLATELETS: 100 10*3/uL — AB (ref 150–400)
RBC: 3.48 MIL/uL — AB (ref 3.87–5.11)
RDW: 19.9 % — ABNORMAL HIGH (ref 11.5–15.5)
WBC: 5.8 10*3/uL (ref 4.0–10.5)

## 2016-10-23 LAB — PROTIME-INR
INR: 2.62
Prothrombin Time: 28.5 seconds — ABNORMAL HIGH (ref 11.4–15.2)

## 2016-10-23 LAB — TROPONIN I: Troponin I: <0.03 ng/mL (ref <0.03)

## 2016-10-23 MED ORDER — CEPHALEXIN 500 MG PO CAPS: 500.0000 mg | ORAL_CAPSULE | Freq: Four times a day (QID) | ORAL | 0 refills | Status: DC

## 2016-10-23 NOTE — ED Provider Notes (Signed)
[facility]-EMERGENCY DEPT Provider Note   CSN: [id number] Arrival date & time: 10/23/16  1333     History   Chief Complaint Chief Complaint  Patient presents with  . Altered Mental Status    HPI Glenda Dickson is a 83 y.o. female.  The history is provided by the patient and a relative. The history is limited by the condition of the patient (AMS).  Altered Mental Status    Pt was seen at 1415. Per EMS, NH report and pt's family: Pt with increasing confusion and generalized weakness over the past several weeks, worse over the past several days. Pt's family states pt "is having bad dreams at night of someone breaking into her room" and "the NH said she has been talking at night and are unsure if she is sleeping." Pt herself denies any complaints.   Past Medical History:  Diagnosis Date  . Anemia, normocytic normochromic 06/26/2012   H&H-11.2/33.7, normal MCV in 06/2012   . Atrial fibrillation (HCC)   . Depression   . Diabetes mellitus type II    no insulin  . Fracture of left hip (HCC) 2006   intertrochanteric; ORIF  . Hyperlipidemia   . Hypertension     Patient Active Problem List   Diagnosis Date Noted  . Pressure injury of skin 09/30/2016  . CAP (community acquired pneumonia) 09/30/2016  . Supratherapeutic INR 09/29/2016  . Chronic diastolic CHF (congestive heart failure) (HCC) 09/29/2016  . Protein calorie malnutrition (HCC) 09/29/2016  . Leg swelling 09/29/2016  . Encounter for therapeutic drug monitoring 11/06/2015  . Chronic kidney disease (CKD), stage IV (severe) (HCC) 08/24/2012  . Cerebrovascular disease 07/20/2012  . Anemia, normocytic normochromic 06/26/2012  . Hypertension   . Diabetes mellitus type II, non insulin dependent (HCC)   . Hyperlipidemia   . Atrial fibrillation [facility])     Past Surgical History:  Procedure Laterality Date  . ABDOMINAL HYSTERECTOMY    . COLONOSCOPY  2003   Negative screening study.  Drucilla Chalet HIP FRACTURE SURGERY     Fixation    OB History    No data available       Home Medications    Prior to Admission medications   Medication Sig Start Date End Date Taking? Authorizing Provider  acetaminophen (TYLENOL) 325 MG tablet Take 650 mg by mouth every 6 (six) hours as needed.    Historical Provider, MD  atorvastatin (LIPITOR) 40 MG tablet Take 40 mg by mouth every morning.     Historical Provider, MD  Calcium Citrate-Vitamin D (CITRACAL + D PO) Take 1 tablet by mouth daily. D3    Historical Provider, MD  collagenase (SANTYL) ointment Apply to sacral wound per tx order    Historical Provider, MD  ferrous sulfate 325 (65 FE) MG tablet Take 325 mg by mouth every evening.  06/29/16   Historical Provider, MD  glipiZIDE (GLUCOTROL XL) 2.5 MG 24 hr tablet Take 2.5 mg by mouth every evening.     Historical Provider, MD  insulin aspart (NOVOLOG FLEXPEN) 100 UNIT/ML FlexPen Give per sliding scale    Historical Provider, MD  metoprolol (LOPRESSOR) 50 MG tablet Take 1 tablet (50 mg total) by mouth 2 (two) times daily. 11/27/15   Laqueta Linden, MD  Multiple Vitamin (MULTIVITAMIN) tablet Take 1 tablet by mouth daily.    Historical Provider, MD  PARoxetine (PAXIL) 20 MG tablet Take 20 mg by mouth every morning.     Historical Provider, MD  ramipril (ALTACE) 5 MG  capsule TAKE 1 CAPSULE BY MOUTH DAILY 09/24/16   Laqueta Linden, MD  warfarin (COUMADIN) 3 MG tablet Take 3 mg by mouth every evening.    Historical Provider, MD    Family History Family History  Problem Relation Age of Onset  . Hypertension Mother   . Heart failure Mother   . Heart attack Father   . Hypertension Father   . Hypertension Sister     Atrial Fib  . Hypertension Brother     4/6 brothers with htn    Social History Social History  Substance Use Topics  . Smoking status: Never Smoker  . Smokeless tobacco: Never Used  . Alcohol use No     Allergies   Benzalkonium chloride-alcohol and Codeine   Review of Systems Review of  Systems  Unable to perform ROS: Mental status change     Physical Exam Updated Vital Signs BP 94/82 (BP Location: Right Arm)   Pulse 96   Temp 98 F (36.7 C) (Oral)   Resp 18   Ht 5\' 6"  (1.676 m)   Wt 180 lb (81.6 kg)   SpO2 97%   BMI 29.05 kg/m   15:09 Orthostatic Vital Signs CS  Orthostatic Lying   BP- Lying: 111/72  Pulse- Lying: 81      Orthostatic Sitting  BP- Sitting: 112/75  Pulse- Sitting: 90      Orthostatic Standing at 0 minutes  BP- Standing at 0 minutes:  126/112 (Pt became unsteady times two assit assited to sitting position )  Pulse- Standing at 0 minutes: 63      Physical Exam 1420: Physical examination:  Nursing notes reviewed; Vital signs and O2 SAT reviewed;  Constitutional: Well developed, Well nourished, Well hydrated, In no acute distress; Head:  Normocephalic, atraumatic; Eyes: EOMI, PERRL, No scleral icterus; ENMT: Mouth and pharynx normal, Mucous membranes moist; Neck: Supple, Full range of motion, No lymphadenopathy; Cardiovascular: Irregular rate and rhythm, No gallop; Respiratory: Breath sounds clear & equal bilaterally, No wheezes.  Speaking full sentences with ease, Normal respiratory effort/excursion; Chest: Nontender, Movement normal; Abdomen: Soft, Nontender, Nondistended, Normal bowel sounds; Genitourinary: No CVA tenderness; Extremities: Pulses normal, No tenderness, +1 pedal edema bilat. No calf asymmetry.; Neuro: AA&Ox3, Major CN grossly intact. No facial droop. Speech clear. No gross focal motor deficits in extremities.; Skin: Color normal, Warm, Dry.    ED Treatments / Results  Labs (all labs ordered are listed, but only abnormal results are displayed)   EKG  EKG Interpretation  Date/Time:  Wednesday October 23 2016 13:56:10 EST Ventricular Rate:  123 PR Interval:    QRS Duration: 76 QT Interval:  348 QTC Calculation: 498 R Axis:   78 Text Interpretation:  Atrial fibrillation Low voltage, extremity leads Nonspecific T  abnormalities, lateral leads Borderline prolonged QT interval Baseline wander Artifact When compared with ECG of 09/29/2016 poor data quality, no significant changes from previous tracing Confirmed by [facility]  MD, Nicholos Johns [phone]) on 10/23/2016 2:30:31 PM       Radiology   Procedures Procedures (including critical care time)  Medications Ordered in ED Medications - No data to display   Initial Impression / Assessment and Plan / ED Course  I have reviewed the triage vital signs and the nursing notes.  Pertinent labs & imaging results that were available during my care of the patient were reviewed by me and considered in my medical decision making (see chart for details).  MDM Reviewed: previous chart, nursing note and vitals Reviewed previous:  labs and ECG Interpretation: labs, ECG, x-ray, CT scan and MRI    Results for orders placed or performed during the hospital encounter of 10/23/16  Comprehensive metabolic panel  Result Value Ref Range   Sodium 139 135 - 145 mmol/L   Potassium 4.4 3.5 - 5.1 mmol/L   Chloride 105 101 - 111 mmol/L   CO2 25 22 - 32 mmol/L   Glucose, Bld 164 (H) 65 - 99 mg/dL   BUN 46 (H) 6 - 20 mg/dL   Creatinine, Ser 7.821.49 (H) 0.44 - 1.00 mg/dL   Calcium 9.0 8.9 - 95.610.3 mg/dL   Total Protein 6.0 (L) 6.5 - 8.1 g/dL   Albumin 3.2 (L) 3.5 - 5.0 g/dL   AST 28 15 - 41 U/L   ALT 23 14 - 54 U/L   Alkaline Phosphatase 124 38 - 126 U/L   Total Bilirubin 2.0 (H) 0.3 - 1.2 mg/dL   GFR calc non Af Amer 31 (L) >60 mL/min   GFR calc Af Amer 35 (L) >60 mL/min   Anion gap 9 5 - 15  CBC with Differential  Result Value Ref Range   WBC 5.8 4.0 - 10.5 K/uL   RBC 3.48 (L) 3.87 - 5.11 MIL/uL   Hemoglobin 10.8 (L) 12.0 - 15.0 g/dL   HCT 21.333.7 (L) 08.636.0 - 57.846.0 %   MCV 96.8 78.0 - 100.0 fL   MCH 31.0 26.0 - 34.0 pg   MCHC 32.0 30.0 - 36.0 g/dL   RDW 46.919.9 (H) 62.911.5 - 52.815.5 %   Platelets 100 (L) 150 - 400 K/uL   Neutrophils Relative % 67 %   Neutro Abs 3.8 1.7 - 7.7 K/uL    Lymphocytes Relative 25 %   Lymphs Abs 1.4 0.7 - 4.0 K/uL   Monocytes Relative 6 %   Monocytes Absolute 0.3 0.1 - 1.0 K/uL   Eosinophils Relative 3 %   Eosinophils Absolute 0.2 0.0 - 0.7 K/uL   Basophils Relative 0 %   Basophils Absolute 0.0 0.0 - 0.1 K/uL  Lactic acid, plasma  Result Value Ref Range   Lactic Acid, Venous 1.5 0.5 - 1.9 mmol/L  Lactic acid, plasma  Result Value Ref Range   Lactic Acid, Venous 1.8 0.5 - 1.9 mmol/L  Troponin I  Result Value Ref Range   Troponin I <0.03 <0.03 ng/mL  Urinalysis, Routine w reflex microscopic  Result Value Ref Range   Color, Urine AMBER (A) YELLOW   APPearance HAZY (A) CLEAR   Specific Gravity, Urine 1.016 1.005 - 1.030   pH 5.0 5.0 - 8.0   Glucose, UA NEGATIVE NEGATIVE mg/dL   Hgb urine dipstick MODERATE (A) NEGATIVE   Bilirubin Urine NEGATIVE NEGATIVE   Ketones, ur NEGATIVE NEGATIVE mg/dL   Protein, ur NEGATIVE NEGATIVE mg/dL   Nitrite NEGATIVE NEGATIVE   Leukocytes, UA TRACE (A) NEGATIVE   RBC / HPF 6-30 0 - 5 RBC/hpf   WBC, UA 6-30 0 - 5 WBC/hpf   Bacteria, UA FEW (A) NONE SEEN  Protime-INR  Result Value Ref Range   Prothrombin Time 28.5 (H) 11.4 - 15.2 seconds   INR 2.62    Dg Chest 2 View Result Date: 10/23/2016 CLINICAL DATA:  Confusion, weakness, altered mental status EXAM: CHEST  2 VIEW COMPARISON:  09/29/2016 FINDINGS: Cardiomegaly again noted. Persistent small left pleural effusion with left basilar atelectasis or infiltrate. No pulmonary edema. Osteopenia and degenerative changes thoracic spine. Degenerative changes right shoulder. IMPRESSION: Persistent small left pleural effusion with left  basilar atelectasis or infiltrate. Cardiomegaly again noted. No pulmonary edema. Electronically Signed   By: Natasha Mead M.D.   On: 10/23/2016 14:33   Ct Head Wo Contrast Result Date: 10/23/2016 CLINICAL DATA:  83 year old female with history of altered mental status since 10/22/2016. EXAM: CT HEAD WITHOUT CONTRAST TECHNIQUE:  Contiguous axial images were obtained from the base of the skull through the vertex without intravenous contrast. COMPARISON:  No priors. FINDINGS: Brain: No evidence of acute infarction, hemorrhage, hydrocephalus, extra-axial collection or mass lesion/mass effect. Mild cerebral atrophy. Patchy and confluent areas of decreased attenuation are noted throughout the deep and periventricular white matter of the cerebral hemispheres bilaterally, compatible with chronic microvascular ischemic disease. Physiologic calcifications in the basal ganglia bilaterally (left greater than right). Vascular: Atherosclerotic calcifications in the cerebral vasculature, most notably in the vertebral arteries bilaterally. No definite aneurysm identified. No hyperdense vessel. Skull: Normal. Negative for fracture or focal lesion. Sinuses/Orbits: No acute finding. Other: None. IMPRESSION: 1. No acute intracranial abnormalities. 2. Mild cerebral atrophy with mild chronic microvascular ischemic changes in the cerebral white matter, as above. 3. Cerebrovascular atherosclerosis. Electronically Signed   By: Trudie Reed M.D.   On: 10/23/2016 14:45   Mr Brain Wo Contrast (neuro Protocol) Result Date: 10/23/2016 CLINICAL DATA:  83 y/o  F; increased confusion weakness. EXAM: MRI HEAD WITHOUT CONTRAST TECHNIQUE: Multiplanar, multiecho pulse sequences of the brain and surrounding structures were obtained without intravenous contrast. COMPARISON:  10/23/2016 CT head FINDINGS: Brain: No acute infarction, hemorrhage, hydrocephalus, extra-axial collection or mass lesion. Foci of T2 FLAIR hyperintensity in subcortical periventricular white matter and within the left hemi pons are nonspecific but compatible with mild chronic microvascular ischemic changes. Mild brain parenchymal volume loss for age. Vascular: Normal flow voids. Skull and upper cervical spine: Normal marrow signal. Sinuses/Orbits: Negative. Other: None. IMPRESSION: 1. Moderate  motion degradation of several sequences. 2. No acute intracranial abnormality. 3. Mild chronic microvascular ischemic changes and parenchymal volume loss of the brain for age. Electronically Signed   By: Mitzi Hansen M.D.   On: 10/23/2016 17:25     1620:  H/H per baseline. BUN/Cr improved from baseline. Pt's son very concerned regarding possible stroke as cause for symptoms; will obtain MRI brain. Son is Adult nurse.  1825:  MRI negative for acute stroke; pt's son feels reassured. No clear indication for admission at this time. Possible UTI on Udip; UC is pending, will tx with keflex. Dx and testing d/w pt and family.  Questions answered.  Verb understanding, agreeable to d/c back to NH with outpt f/u.     Final Clinical Impressions(s) / ED Diagnoses   Final diagnoses:  None    New Prescriptions New Prescriptions   No medications on file     Samuel Jester, DO 10/26/16 1341

## 2016-10-23 NOTE — ED Notes (Signed)
Son states pt sent here for tests but unable to state what is wrong with pt and/or tests to be run.  Pt states that there is nothing wrong with her.

## 2016-10-23 NOTE — Discharge Instructions (Signed)
Take the prescription as directed.  Call your regular medical doctor tomorrow to schedule a follow up appointment within the next 2 days.  Return to the Emergency Department immediately sooner if worsening.  ° °

## 2016-10-23 NOTE — Progress Notes (Signed)
Location:   [facility] Room Number: 152/D Place of Service:  SNF (31) Provider:  Silverio DecampAnjali,Thelbert Gartin  Zack Hall, MD  Patient Care Team: Benita StabileJohn Z Hall, MD as PCP - General (Internal Medicine) Kathlen Brunswickobert M Rothbart, MD (Cardiology) Isaiah BlakesMichael S Forrest, LCSW as [facility] Care Management (Licensed Clinical Social Worker)  Extended Emergency Contact Information Primary Emergency Contact: Jarrett,Teresa Address: [address]          [place] [id number] [place] Home Phone: [phone] Relation: Daughter Secondary Emergency Contact: Burr MedicoWalker,Van  [place] Home Phone: [phone] Relation: Son  Code Status:  Full Code Goals of care: Advanced Directive information Advanced Directives 10/23/2016  Does Patient Have a Medical Advance Directive? Yes  Type of Advance Directive (No Data)  Does patient want to make changes to medical advance directive? No - Patient declined  Copy of Healthcare Power of Attorney in Chart? -  Would patient like information on creating a medical advance directive? -     Chief Complaint  Patient presents with  . Acute Visit    Confusion,2 lb wt. gain    HPI:  Pt is a 83 y.o. female seen today for an acute visit for Increasing confusion and Possible Visual Hallucinations per family and staff.  Patient has h/o Chronic Atrial fibrillation, Anemia, Hypertension, Type 2 Diabetes, Chronic Renal disease and was admitted in the SNF for therapy after diagnosis of Pneumonia and Weakness in the hospital. Per her son she was doing well initially but for past few days she has been not participating in therapy. She is also more confused with Couple of instances she has had Visual Hallucinations.  Patient denies any c/o Chest pain., Cough,SOB or Fever.or dysuria. Patient says she did see somebody in her room few days ago and she is seems upset when question her more about this.  Past Medical History:  Diagnosis Date    . Anemia, normocytic normochromic 06/26/2012   H&H-11.2/33.7, normal MCV in 06/2012   . Atrial fibrillation (HCC)   . Depression   . Diabetes mellitus type II    no insulin  . Fracture of left hip (HCC) 2006   intertrochanteric; ORIF  . Hyperlipidemia   . Hypertension    Past Surgical History:  Procedure Laterality Date  . ABDOMINAL HYSTERECTOMY    . COLONOSCOPY  2003   Negative screening study.  Drucilla Chalet. INTERTROCHANTERIC HIP FRACTURE SURGERY     Fixation    Allergies  Allergen Reactions  . Benzalkonium Chloride-Alcohol Other (See Comments)    Patient/family is not familiar with this allergy  . Codeine Itching and Nausea Only    Current Outpatient Prescriptions on File Prior to Visit  Medication Sig Dispense Refill  . atorvastatin (LIPITOR) 40 MG tablet Take 40 mg by mouth every morning.     . Calcium Citrate-Vitamin D (CITRACAL + D PO) Take 1 tablet by mouth daily. D3    . collagenase (SANTYL) ointment Apply to sacral wound per tx order    . ferrous sulfate 325 (65 FE) MG tablet Take 325 mg by mouth every evening.   5  . glipiZIDE (GLUCOTROL XL) 2.5 MG 24 hr tablet Take 2.5 mg by mouth every evening.     . metoprolol (LOPRESSOR) 50 MG tablet Take 1 tablet (50 mg total) by mouth 2 (two) times daily. 180 tablet 3  . Multiple Vitamin (MULTIVITAMIN) tablet Take 1 tablet by mouth daily.    Marland Kitchen. PARoxetine (PAXIL) 20 MG tablet Take  20 mg by mouth every morning.     . ramipril (ALTACE) 5 MG capsule TAKE 1 CAPSULE BY MOUTH DAILY 90 capsule 0  . warfarin (COUMADIN) 3 MG tablet Take 3 mg by mouth every evening.     No current facility-administered medications on file prior to visit.      Review of Systems  Constitutional: Positive for activity change. Negative for appetite change, chills, diaphoresis, fatigue and fever.  HENT: Negative for congestion, nosebleeds, postnasal drip and rhinorrhea.   Respiratory: Negative.   Cardiovascular: Positive for leg swelling. Negative for chest  pain.  Gastrointestinal: Negative.   Genitourinary: Negative for dysuria, frequency and urgency.  Musculoskeletal: Negative.   Skin: Negative.   Neurological: Negative for dizziness, speech difficulty, weakness and light-headedness.  Psychiatric/Behavioral: Positive for confusion, dysphoric mood, hallucinations and sleep disturbance. Negative for agitation. The patient is not nervous/anxious.     There is no immunization history for the selected administration types on file for this patient. Pertinent  Health Maintenance Due  Topic Date Due  . FOOT EXAM  08/02/1938  . OPHTHALMOLOGY EXAM  [birth year]02/1938  . URINE MICROALBUMIN  [birth year]02/1938  . DEXA SCAN  08/03/1995  . INFLUENZA VACCINE  08/23/2017 (Originally 04/23/2016)  . PNA vac Low Risk Adult (1 of 2 - PCV13) 08/23/2017 (Originally 08/03/1995)  . HEMOGLOBIN A1C  03/29/2017   Fall Risk  10/08/2016  Falls in the past year? Yes  Number falls in past yr: 2 or more  Injury with Fall? Yes  Risk Factor Category  High Fall Risk  Risk for fall due to : History of fall(s)  Follow up Falls prevention discussed   Functional Status Survey:    Vitals:   10/23/16 0933  BP: 125/74  Pulse: 91  Resp: (!) 22  Temp: 97 F (36.1 C)  TempSrc: Oral   There is no height or weight on file to calculate BMI. Physical Exam  Constitutional: She appears well-developed and well-nourished.  HENT:  Head: Normocephalic.  Mouth/Throat: Oropharynx is clear and moist.  Eyes: Pupils are equal, round, and reactive to light.  Neck: Neck supple.  Cardiovascular: Normal rate.  An irregularly irregular rhythm present.  No murmur heard. Pulmonary/Chest: Effort normal and breath sounds normal. No respiratory distress. She has no wheezes. She has no rales.  Abdominal: Soft. Bowel sounds are normal. She exhibits no distension. There is no tenderness. There is no rebound.  Musculoskeletal:  Moderate edema B/L  Neurological: She is alert.  She was oriented to place  and was able to tell me date by reading on the board. She had good strength in all extremities. Was able to name objects with no problem  Skin: Skin is warm and dry. No rash noted. No erythema.  Psychiatric: Her affect is angry. Her speech is not rapid and/or pressured and not slurred. She is withdrawn. She exhibits a depressed mood.    Labs reviewed:  Recent Labs  10/01/16 0624  10/09/16 0500 10/18/16 1955 10/21/16 0800  NA 139  < > 134* 139 141  K 3.3*  < > 5.1 4.8 4.5  CL 105  < > 99* 106 106  CO2 27  < > 28 26 27   GLUCOSE 120*  < > 291* 115* 95  BUN 39*  < > 79* 53* 49*  CREATININE 1.57*  < > 1.86* 1.78* 1.56*  CALCIUM 7.9*  < > 9.2 9.0 9.0  MG 1.5*  --   --   --   --   < > =  values in this interval not displayed.  Recent Labs  09/29/16 1632 09/30/16 0513  AST 24 21  ALT 16 14  ALKPHOS 132* 98  BILITOT 1.9* 1.1  PROT 6.1* 5.1*  ALBUMIN 2.7* 2.5*    Recent Labs  07/17/16 1726 09/29/16 1632 10/03/16 0500 10/07/16 0730 10/09/16 0500  WBC 6.8 10.6* 6.2 9.1 8.1  NEUTROABS 4.7 8.2*  --   --   --   HGB 10.9* 10.1* 8.9* 9.7* 10.0*  HCT 33.5* 31.7* 27.6* 30.3*  TEST REQUEST RECEIVED WITHOUT APPROPRIATE SPECIMEN 30.3*  MCV 88.2 93.2 91.1 92.7 91.0  PLT 143* 192 161 213 192   Lab Results  Component Value Date   TSH 5.046 (H) 06/25/2012   Lab Results  Component Value Date   HGBA1C 5.8 (H) 09/29/2016   Lab Results  Component Value Date   CHOL 101 10/23/2015   HDL 30 (L) 10/23/2015   LDLCALC 52 10/23/2015   TRIG 96 10/23/2015   CHOLHDL 3.4 10/23/2015    Significant Diagnostic Results in last 30 days:  Dg Chest Port 1 View  Result Date: 09/29/2016 CLINICAL DATA:  83 year old with progressive generalized weakness over the past 2 weeks associated with bilateral lower extremity weeping edema. EXAM: PORTABLE CHEST 1 VIEW COMPARISON:  None. FINDINGS: Cardiac silhouette mildly enlarged for the AP portable technique, unchanged. Thoracic aorta atherosclerotic,  unchanged. Hilar and mediastinal contours otherwise unremarkable. Dense consolidation in the left lower lobe associated with a left pleural effusion. Lungs otherwise clear. No visible right pleural effusion. Pulmonary vascularity normal. Severe degenerative changes involving the right shoulder joint with dense calcifications involving the capsule and/or the subacromial bursa. IMPRESSION: 1. Left lower lobe pneumonia and associated left pleural effusion. 2. Stable mild cardiomegaly without evidence of pulmonary edema. 3. Thoracic aortic atherosclerosis. Electronically Signed   By: Hulan Saas M.D.   On: 09/29/2016 17:13    Assessment/Plan  Altered Mental status Patient does not have any acute changes on my exam. But per Therapy and her family she is not the same. Her UA is normal.  I have D/W Son in detail. We can do some test here including CT scan but they would rather Have her evaluated in the hospital. So per their request will send her to the ED for further eval of confusion.  Patient does have Chronic Atrial fibrillation on chronic coumadin  Chronic CHF Her lasix was on hold as patient was dehydrated with increase in her BUN. Needs to be restarted on Lasix 20 mg. Diabetes Mellitus BS has been running less then 150 in the facility since her prednisone was Tapered Off.   Family/ staff Communication: D/w Son   Labs/tests ordered:

## 2016-10-23 NOTE — ED Triage Notes (Signed)
Per pt family and per [facility], pt has increased confusion and weakness since yesterday. Pt alert and oriented. Pt denies pain. nad noted.

## 2016-10-23 NOTE — ED Notes (Signed)
Patient transported to X-ray 

## 2016-10-24 DIAGNOSIS — J189 Pneumonia, unspecified organism: Secondary | ICD-10-CM | POA: Diagnosis not present

## 2016-10-24 DIAGNOSIS — M6281 Muscle weakness (generalized): Secondary | ICD-10-CM | POA: Diagnosis not present

## 2016-10-24 DIAGNOSIS — N184 Chronic kidney disease, stage 4 (severe): Secondary | ICD-10-CM | POA: Diagnosis not present

## 2016-10-24 DIAGNOSIS — R279 Unspecified lack of coordination: Secondary | ICD-10-CM | POA: Diagnosis not present

## 2016-10-24 DIAGNOSIS — F0390 Unspecified dementia without behavioral disturbance: Secondary | ICD-10-CM

## 2016-10-24 HISTORY — DX: Unspecified dementia, unspecified severity, without behavioral disturbance, psychotic disturbance, mood disturbance, and anxiety: F03.90

## 2016-10-24 LAB — URINE CULTURE: CULTURE: NO GROWTH

## 2016-10-25 ENCOUNTER — Non-Acute Institutional Stay (SKILLED_NURSING_FACILITY): Payer: Medicare Other | Admitting: Internal Medicine

## 2016-10-25 ENCOUNTER — Encounter: Payer: Self-pay | Admitting: Internal Medicine

## 2016-10-25 ENCOUNTER — Encounter (HOSPITAL_COMMUNITY)
Admission: RE | Admit: 2016-10-25 | Discharge: 2016-10-25 | Disposition: A | Discharge status: Home / Self Care | Payer: Medicare Other | Source: Skilled Nursing Facility | Attending: Internal Medicine | Admitting: Internal Medicine

## 2016-10-25 DIAGNOSIS — I5032 Chronic diastolic (congestive) heart failure: Secondary | ICD-10-CM | POA: Diagnosis not present

## 2016-10-25 DIAGNOSIS — R21 Rash and other nonspecific skin eruption: Secondary | ICD-10-CM

## 2016-10-25 DIAGNOSIS — J189 Pneumonia, unspecified organism: Secondary | ICD-10-CM | POA: Diagnosis not present

## 2016-10-25 DIAGNOSIS — I482 Chronic atrial fibrillation: Secondary | ICD-10-CM | POA: Insufficient documentation

## 2016-10-25 DIAGNOSIS — R600 Localized edema: Secondary | ICD-10-CM | POA: Insufficient documentation

## 2016-10-25 DIAGNOSIS — Z7952 Long term (current) use of systemic steroids: Secondary | ICD-10-CM | POA: Insufficient documentation

## 2016-10-25 DIAGNOSIS — Z7984 Long term (current) use of oral hypoglycemic drugs: Secondary | ICD-10-CM | POA: Insufficient documentation

## 2016-10-25 DIAGNOSIS — M6281 Muscle weakness (generalized): Secondary | ICD-10-CM | POA: Diagnosis not present

## 2016-10-25 DIAGNOSIS — N189 Chronic kidney disease, unspecified: Secondary | ICD-10-CM | POA: Diagnosis not present

## 2016-10-25 DIAGNOSIS — Z79899 Other long term (current) drug therapy: Secondary | ICD-10-CM | POA: Diagnosis not present

## 2016-10-25 DIAGNOSIS — N184 Chronic kidney disease, stage 4 (severe): Secondary | ICD-10-CM | POA: Diagnosis not present

## 2016-10-25 DIAGNOSIS — R279 Unspecified lack of coordination: Secondary | ICD-10-CM | POA: Diagnosis not present

## 2016-10-25 LAB — URINE CULTURE: Culture: NO GROWTH

## 2016-10-25 LAB — PROTIME-INR
INR: 2.68
Prothrombin Time: 29.1 seconds — ABNORMAL HIGH (ref 11.4–15.2)

## 2016-10-25 NOTE — Progress Notes (Signed)
Location:   [facility] Room Number: 152/D Place of Service:  SNF [phone]) Provider:  Phylis Bougie, MD  Patient Care Team: Benita Stabile, MD as PCP - General (Internal Medicine) Kathlen Brunswick, MD (Cardiology) Isaiah Blakes, LCSW as [facility] Care Management (Licensed Clinical Social Worker)  Extended Emergency Contact Information Primary Emergency Contact: Forrest Moron Address: [address]          [place], [place] [address] [place] Home Phone: [phone] Relation: Daughter Secondary Emergency Contact: Lanna Poche Address: [address]          [place], [place] [address] [place] Home Phone: [phone] Relation: Son  Code Status:  Full Code Goals of care: Advanced Directive information Advanced Directives 10/25/2016  Does Patient Have a Medical Advance Directive? Yes  Type of Advance Directive (No Data)  Does patient want to make changes to medical advance directive? No - Patient declined  Copy of Healthcare Power of Attorney in Chart? -  Would patient like information on creating a medical advance directive? -     Chief Complaint  Patient presents with  . Acute Visit    Bilateral Left leg redness no pain    HPI:  Pt is a 83 y.o. female seen today for an acute visit for Possible erythema of her lower extremities bilaterally.  Patient has had a somewhat of a busy week she actually went to the ER on January 31 secondary to mental status changes-workup was fairly nonrevealing MRI of the head did not show any acute process lab work was fairly unremarkable there was suspicion she possibly had a urinary tract infection and she was empirically started on Keflex-however culture has come back negative.  She does have a history of lower extremity edema venous stasis changes-nursing has left a note about some possible increased erythema of her lower legs.  She also has a history of diastolic CHF at one  point been on Lasix but this was held secondary to concerns about her renal function creatinine on lab done on Jerry 31st however did show some slight improvement in her renal function with a creatinine of 1.49 BUN of 46.  There was recommendation I see her doctor does note to restart Lasix secondary to some weight gain-weight most recently 189 it appears this is a gain of about 5 pounds over the past 6 days.  She is not complaining of any increased shortness of breath cough or chest pain-nursing staff feels maybe she's had some minimally increased edema.  I did review her cardiac echo done earlier this year which showed normal systolic function 50-55 percent she may have some mild diastolic dysfunction however.  Currently she is sitting in her wheelchair accompanied denies any shortness of breath cough fever or chills-   Past Medical History:  Diagnosis Date  . Anemia, normocytic normochromic 06/26/2012   H&H-11.2/33.7, normal MCV in 06/2012   . Atrial fibrillation (HCC)   . Depression   . Diabetes mellitus type II    no insulin  . Fracture of left hip (HCC) 2006   intertrochanteric; ORIF  . Hyperlipidemia   . Hypertension    Past Surgical History:  Procedure Laterality Date  . ABDOMINAL HYSTERECTOMY    . COLONOSCOPY  2003   Negative screening study.  Dru[facility]illa [facility]et HIP FRACTURE SURGERY     Fixation    Allergies  Allergen Reactions  . Benzalkonium Chloride-Alcohol Other (See Comments)    Patient/family  is not familiar with this allergy  . Codeine Itching and Nausea Only    Allergies as of 10/25/2016      Reactions   Benzalkonium Chloride-alcohol Other (See Comments)   Patient/family is not familiar with this allergy   Codeine Itching, Nausea Only      Medication List       Accurate as of 10/25/16 11:11 AM. Always use your most recent med list.          acetaminophen 325 MG tablet Commonly known as:  TYLENOL Take 650 mg by mouth every 6 (six) hours as needed  for mild pain or moderate pain.   atorvastatin 40 MG tablet Commonly known as:  LIPITOR Take 40 mg by mouth every morning.   Calcium Citrate-Vitamin D 315-250 MG-UNIT Tabs Take 1 tablet by mouth daily.   cephALEXin 500 MG capsule Commonly known as:  KEFLEX Take 1 capsule (500 mg total) by mouth 4 (four) times daily.   feeding supplement (PRO-STAT SUGAR FREE 64) Liqd Take 30 mLs by mouth 3 (three) times daily with meals.   ferrous sulfate 325 (65 FE) MG tablet Take 325 mg by mouth every evening.   glipiZIDE 2.5 MG 24 hr tablet Commonly known as:  GLUCOTROL XL Take 2.5 mg by mouth every evening.   metoprolol 50 MG tablet Commonly known as:  LOPRESSOR Take 1 tablet (50 mg total) by mouth 2 (two) times daily.   multivitamin tablet Take 1 tablet by mouth daily.   NOVOLOG FLEXPEN 100 UNIT/ML FlexPen Generic drug:  insulin aspart Inject 2-10 Units into the skin 4 (four) times daily -  before meals and at bedtime. Give per sliding scale:: If 60- Call MD 200-250= 2 units 251-300= 4 units 301-350= 6 units 351-400= 8 units Over400=10units 401+ Call MD   PARoxetine 20 MG tablet Commonly known as:  PAXIL Take 20 mg by mouth every morning.   ramipril 5 MG capsule Commonly known as:  ALTACE TAKE 1 CAPSULE BY MOUTH DAILY   SANTYL ointment Generic drug:  collagenase Apply 1 application topically as directed. Apply to sacral wound per tx order   warfarin 3 MG tablet Commonly known as:  COUMADIN Take 3 mg by mouth every evening.         Review of Systems   In general she is not complaining of any fever or chills.  Skin is not complaining of any itching pressure will rash venous stasis changes lower extremities.    Ears eyes nose mouth and throat does not complain of visual changes or sore throat.  Respiratory denies shortness breath or cough.  Cardiac does not complaining of chest pain does have some lower extremity edema possibly slightly increased from baseline per  nursing but not dramatically so.  GI is not complaining of any abdominal discomfort nausea vomiting diarrhea constipation.  GU does not complain of dysuria again recent urine culture was negative.  Muscle skeletal is not complaining currently of joint pain.  Neurologic is not complaining of headache dizziness or syncope.  Psych has had some increased confusion again she did go to the ER where workup appear to be negative for an acute process she is pleasant and largely appropriate today although apparently still has some confusion  There is no immunization history for the selected administration types on file for this patient. Pertinent  Health Maintenance Due  Topic Date Due  . FOOT EXAM  08/02/1938  . OPHTHALMOLOGY EXAM  08/02/1938  . DEXA SCAN  08/03/1995  .  INFLUENZA VACCINE  08/23/2017 (Originally 04/23/2016)  . PNA vac Low Risk Adult (1 of 2 - PCV13) 08/23/2017 (Originally 08/03/1995)  . HEMOGLOBIN A1C  03/29/2017   Fall Risk  10/08/2016  Falls in the past year? Yes  Number falls in past yr: 2 or more  Injury with Fall? Yes  Risk Factor Category  High Fall Risk  Risk for fall due to : History of fall(s)  Follow up Falls prevention discussed   Functional Status Survey:    Vitals:   10/25/16 1053  BP: 116/62  Pulse: 81  Resp: 20  Temp: 97.9 F (36.6 C)  TempSrc: Oral   Weight is 189.2 pounds  Physical Exam Constitutional: She appears well-developed and well-nourished. Sitting comfortably in her wheelchair HENT:  Head: Normocephalic.  Mouth/Throat: Oropharynx is clear and moist.  Eyes: Pupils are equal, round, and reactive to light.  Neck: Neck supple.  Cardiovascular: Normal rate.  An irregularly irregular rhythm present.  No murmur heard. Pulmonary/Chest: Effort normal and breath sounds normal. No respiratory distress. She has no wheezes. She has no rales.  Abdominal: Soft. Bowel sounds are normal. She exhibits no distension. There is no tenderness. There is no  rebound.  Musculoskeletal:  Moderate edema B/L  I would say one plus this is cool to touch and nontender  Neurological: She is alert.  She was oriented to place and was able to tell me date by reading on the board. She had good strength in all extremities.  she appeared been good spirits today-she did talk about her father who apparently sold bonds for the [employer] in the past-she does have periods of confusion per nursing Skin: Skin is warm and dry. Lower legs bilaterally I did not see significant warmth or concerning erythema that would indicate cellulitis-there is some pale erythema with scaling-suspect this is more venous stasis related- but will have to be watched  Psychiatric: She is pleasant again-- fairly conversant today appears to be doing better then when she was seen by Dr. Chales Abrahams earlier this week  Labs reviewed:  Recent Labs  10/01/16 0624  10/18/16 1955 10/21/16 0800 10/23/16 1443  NA 139  < > 139 141 139  K 3.3*  < > 4.8 4.5 4.4  CL 105  < > 106 106 105  CO2 27  < > 26 27 25   GLUCOSE 120*  < > 115* 95 164*  BUN 39*  < > 53* 49* 46*  CREATININE 1.57*  < > 1.78* 1.56* 1.49*  CALCIUM 7.9*  < > 9.0 9.0 9.0  MG 1.5*  --   --   --   --   < > = values in this interval not displayed.  Recent Labs  09/29/16 1632 09/30/16 0513 10/23/16 1443  AST 24 21 28   ALT 16 14 23   ALKPHOS 132* 98 124  BILITOT 1.9* 1.1 2.0*  PROT 6.1* 5.1* 6.0*  ALBUMIN 2.7* 2.5* 3.2*    Recent Labs  07/17/16 1726 09/29/16 1632  10/07/16 0730 10/09/16 0500 10/23/16 1443  WBC 6.8 10.6*  < > 9.1 8.1 5.8  NEUTROABS 4.7 8.2*  --   --   --  3.8  HGB 10.9* 10.1*  < > 9.7* 10.0* 10.8*  HCT 33.5* 31.7*  < > 30.3*  TEST REQUEST RECEIVED WITHOUT APPROPRIATE SPECIMEN 30.3* 33.7*  MCV 88.2 93.2  < > 92.7 91.0 96.8  PLT 143* 192  < > 213 192 100*  < > = values in this interval not  displayed. Lab Results  Component Value Date   TSH 5.046 (H) 06/25/2012   Lab Results  Component Value  Date   HGBA1C 5.8 (H) 09/29/2016   Lab Results  Component Value Date   CHOL 101 10/23/2015   HDL 30 (L) 10/23/2015   LDLCALC 52 10/23/2015   TRIG 96 10/23/2015   CHOLHDL 3.4 10/23/2015    Significant Diagnostic Results in last 30 days:  Dg Chest 2 View  Result Date: 10/23/2016 CLINICAL DATA:  Confusion, weakness, altered mental status EXAM: CHEST  2 VIEW COMPARISON:  09/29/2016 FINDINGS: Cardiomegaly again noted. Persistent small left pleural effusion with left basilar atelectasis or infiltrate. No pulmonary edema. Osteopenia and degenerative changes thoracic spine. Degenerative changes right shoulder. IMPRESSION: Persistent small left pleural effusion with left basilar atelectasis or infiltrate. Cardiomegaly again noted. No pulmonary edema. Electronically Signed   By: Natasha Mead M.D.   On: 10/23/2016 14:33   Ct Head Wo Contrast  Result Date: 10/23/2016 CLINICAL DATA:  83 year old female with history of altered mental status since 10/22/2016. EXAM: CT HEAD WITHOUT CONTRAST TECHNIQUE: Contiguous axial images were obtained from the base of the skull through the vertex without intravenous contrast. COMPARISON:  No priors. FINDINGS: Brain: No evidence of acute infarction, hemorrhage, hydrocephalus, extra-axial collection or mass lesion/mass effect. Mild cerebral atrophy. Patchy and confluent areas of decreased attenuation are noted throughout the deep and periventricular white matter of the cerebral hemispheres bilaterally, compatible with chronic microvascular ischemic disease. Physiologic calcifications in the basal ganglia bilaterally (left greater than right). Vascular: Atherosclerotic calcifications in the cerebral vasculature, most notably in the vertebral arteries bilaterally. No definite aneurysm identified. No hyperdense vessel. Skull: Normal. Negative for fracture or focal lesion. Sinuses/Orbits: No acute finding. Other: None. IMPRESSION: 1. No acute intracranial abnormalities. 2. Mild  cerebral atrophy with mild chronic microvascular ischemic changes in the cerebral white matter, as above. 3. Cerebrovascular atherosclerosis. Electronically Signed   By: Trudie Reed M.D.   On: 10/23/2016 14:45   Mr Brain Wo Contrast (neuro Protocol)  Result Date: 10/23/2016 CLINICAL DATA:  83 y/o  F; increased confusion weakness. EXAM: MRI HEAD WITHOUT CONTRAST TECHNIQUE: Multiplanar, multiecho pulse sequences of the brain and surrounding structures were obtained without intravenous contrast. COMPARISON:  10/23/2016 CT head FINDINGS: Brain: No acute infarction, hemorrhage, hydrocephalus, extra-axial collection or mass lesion. Foci of T2 FLAIR hyperintensity in subcortical periventricular white matter and within the left hemi pons are nonspecific but compatible with mild chronic microvascular ischemic changes. Mild brain parenchymal volume loss for age. Vascular: Normal flow voids. Skull and upper cervical spine: Normal marrow signal. Sinuses/Orbits: Negative. Other: None. IMPRESSION: 1. Moderate motion degradation of several sequences. 2. No acute intracranial abnormality. 3. Mild chronic microvascular ischemic changes and parenchymal volume loss of the brain for age. Electronically Signed   By: Mitzi Hansen M.D.   On: 10/23/2016 17:25   Dg Chest Port 1 View  Result Date: 09/29/2016 CLINICAL DATA:  83 year old with progressive generalized weakness over the past 2 weeks associated with bilateral lower extremity weeping edema. EXAM: PORTABLE CHEST 1 VIEW COMPARISON:  None. FINDINGS: Cardiac silhouette mildly enlarged for the AP portable technique, unchanged. Thoracic aorta atherosclerotic, unchanged. Hilar and mediastinal contours otherwise unremarkable. Dense consolidation in the left lower lobe associated with a left pleural effusion. Lungs otherwise clear. No visible right pleural effusion. Pulmonary vascularity normal. Severe degenerative changes involving the right shoulder joint with  dense calcifications involving the capsule and/or the subacromial bursa. IMPRESSION: 1. Left lower lobe pneumonia and  associated left pleural effusion. 2. Stable mild cardiomegaly without evidence of pulmonary edema. 3. Thoracic aortic atherosclerosis. Electronically Signed   By: Hulan Saas M.D.   On: 09/29/2016 17:13    Assessment/Plan   #1 lower extremity erythema-this is not very dramatic today this appears possibly more venous stasis-related I do not really see signs of cellulitis with warmth tenderness-this is pale  erythema with some scaling-I did assess this with wound care nurse and at this point will monitor for any changes  #2 history of chronic CHF thought to be diastolic-Lasix was on hold secondary to concerns of renal insufficiency however she has gained some weight here as noted above per review of note by Dr. Chales Abrahams before patient went to the ER earlier this week there was recommendation to restart Lasix and we'll do that 20 mg a day will start along with potassium 10 mEq a day and recheck a metabolic panel on Monday, February 5.  Also will recheck a BMP in 10 days to ensure stability of renal function and electrolytes.  #3 history of confusion-apparently a neurology consult is pending workup in the ER was fairly nondiagnostic-she was started empirically on Keflex for UTI but culture has come back negative will discontinue the Keflex and monitor.  [id number]       London Sheer, CMA [phone]

## 2016-10-28 ENCOUNTER — Other Ambulatory Visit (HOSPITAL_COMMUNITY)
Admission: AD | Admit: 2016-10-28 | Discharge: 2016-10-28 | Disposition: A | Discharge status: Home / Self Care | Payer: Medicare Other | Source: Skilled Nursing Facility | Attending: Internal Medicine | Admitting: Internal Medicine

## 2016-10-28 ENCOUNTER — Encounter: Payer: Self-pay | Admitting: Internal Medicine

## 2016-10-28 ENCOUNTER — Encounter (HOSPITAL_COMMUNITY)
Admission: RE | Admit: 2016-10-28 | Discharge: 2016-10-28 | Disposition: A | Discharge status: Home / Self Care | Payer: Medicare Other | Source: Skilled Nursing Facility | Attending: Internal Medicine | Admitting: Internal Medicine

## 2016-10-28 ENCOUNTER — Non-Acute Institutional Stay (SKILLED_NURSING_FACILITY): Payer: Medicare Other | Admitting: Internal Medicine

## 2016-10-28 DIAGNOSIS — I482 Chronic atrial fibrillation, unspecified: Secondary | ICD-10-CM

## 2016-10-28 DIAGNOSIS — Z7952 Long term (current) use of systemic steroids: Secondary | ICD-10-CM | POA: Insufficient documentation

## 2016-10-28 DIAGNOSIS — E119 Type 2 diabetes mellitus without complications: Secondary | ICD-10-CM | POA: Diagnosis not present

## 2016-10-28 DIAGNOSIS — I5032 Chronic diastolic (congestive) heart failure: Secondary | ICD-10-CM

## 2016-10-28 DIAGNOSIS — M6281 Muscle weakness (generalized): Secondary | ICD-10-CM | POA: Diagnosis not present

## 2016-10-28 DIAGNOSIS — J189 Pneumonia, unspecified organism: Secondary | ICD-10-CM | POA: Diagnosis not present

## 2016-10-28 DIAGNOSIS — Z7984 Long term (current) use of oral hypoglycemic drugs: Secondary | ICD-10-CM | POA: Diagnosis not present

## 2016-10-28 DIAGNOSIS — R600 Localized edema: Secondary | ICD-10-CM | POA: Insufficient documentation

## 2016-10-28 DIAGNOSIS — Z79899 Other long term (current) drug therapy: Secondary | ICD-10-CM | POA: Diagnosis not present

## 2016-10-28 DIAGNOSIS — N184 Chronic kidney disease, stage 4 (severe): Secondary | ICD-10-CM | POA: Diagnosis not present

## 2016-10-28 DIAGNOSIS — R791 Abnormal coagulation profile: Secondary | ICD-10-CM | POA: Diagnosis not present

## 2016-10-28 DIAGNOSIS — R279 Unspecified lack of coordination: Secondary | ICD-10-CM | POA: Diagnosis not present

## 2016-10-28 LAB — PROTIME-INR
INR: 3.28
PROTHROMBIN TIME: 34.1 s — AB (ref 11.4–15.2)

## 2016-10-28 NOTE — Progress Notes (Addendum)
Location:    [facility] Room Number: 152/D Place of Service:  SNF (31)  Provider: Edmon Crape  PCP: [name], MD Patient Care Team: Benita Stabile, MD as PCP - General (Internal Medicine) Kathlen Brunswick, MD (Cardiology) Isaiah Blakes, LCSW as [facility] Care Management (Licensed Clinical Social Worker)  Extended Emergency Contact Information Primary Emergency Contact: Forrest Moron Address: [address]          [place], [place] [address] [place] Home Phone: [phone] Relation: Daughter Secondary Emergency Contact: Lanna Poche Address: [address]          [place], [place] [address] [place] Home Phone: [phone] Relation: Son  Code Status: Full Code Goals of care:  Advanced Directive information Advanced Directives 10/28/2016  Does Patient Have a Medical Advance Directive? Yes  Type of Advance Directive (No Data)  Does patient want to make changes to medical advance directive? No - Patient declined  Copy of Healthcare Power of Attorney in Chart? -  Would patient like information on creating a medical advance directive? -     Allergies  Allergen Reactions  . Benzalkonium Chloride-Alcohol Other (See Comments)    Patient/family is not familiar with this allergy  . Codeine Itching and Nausea Only    Chief Complaint  Patient presents with  . Discharge Note    HPI:  83 y.o. female  seen today for discharge-she will be going to another facility.  She has a history of atrial fibrillation anemia hypertension type 2 diabetes chronic renal disease.  She was recently hospitalized for generalized weakness and decreased appetite and edema of her legs.  She was found to have left lower lobe pneumonia was treated with Levaquin-she also had an INR that was supratherapeutic at 4.77-she is on Coumadin with her history of A. fib this was addressed as well.  Her Norvasc was discontinued because of her lower  extremity swelling.  Her stay here has been relatively unremarkable she did have some continued confusion and did go to the ER where workup did not really show any specific cause--MRI of the head was negative for any acute process lab work was fairly unremarkable-she was put on antibiotics for suspected UTI culture did not really go out anything significant.  Regards to diastolic CHF initially her Lasix was held because of renal issues but her creatinine did improve somewhat and Lasix has been restarted at 20 mg a day appears she's gained about 10 pounds since her admission but at one point she was not on Lasix but thishas been restarted this will have to be watched closely however at her new facility.  IEdema  appear to be baseline today she is not complaining of any increased shortness of breath or chest pain-suspect some weight gain may be appetite related as well.   She continues to have venous stasis changes lower extremities I saw her last week for possible rash but this appears to be more venous stasis related and appears relatively unchanged today this will have to be monitored as well when she is discharged.  Currently she is sitting in her wheelchair comfortably does not have any acute complaints.   Her Coumadin has been restarted however INR is slightly high today at 3.28 we will hold it tonight and have this rechecked tomorrow.   She is a type II diabetic she is on glipizide 2.5 mg a day and blood sugars have been fairly stable mainly in the low 100s  Past Medical History:  Diagnosis Date  . Anemia, normocytic normochromic 06/26/2012   H&H-11.2/33.7, normal MCV in 06/2012   . Atrial fibrillation (HCC)   . Depression   . Diabetes mellitus type II    no insulin  . Fracture of left hip (HCC) 2006   intertrochanteric; ORIF  . Hyperlipidemia   . Hypertension     Past Surgical History:  Procedure Laterality Date  . ABDOMINAL HYSTERECTOMY    . COLONOSCOPY   2003   Negative screening study.  Drucilla [facility]et HIP FRACTURE SURGERY     Fixation      reports that she has never smoked. She has never used smokeless tobacco. She reports that she does not drink alcohol or use drugs. Social History   Social History  . Marital status: Widowed    Spouse name: N/A  . Number of children: N/A  . Years of education: N/A   Occupational History  . Not on file.   Social History Main Topics  . Smoking status: Never Smoker  . Smokeless tobacco: Never Used  . Alcohol use No  . Drug use: No  . Sexual activity: Not Currently   Other Topics Concern  . Not on file   Social History Narrative  . No narrative on file   Functional Status Survey:    Allergies  Allergen Reactions  . Benzalkonium Chloride-Alcohol Other (See Comments)    Patient/family is not familiar with this allergy  . Codeine Itching and Nausea Only    Pertinent  Health Maintenance Due  Topic Date Due  . FOOT EXAM  08/02/1938  . OPHTHALMOLOGY EXAM  08/02/1938  . URINE MICROALBUMIN  08/02/1938  . DEXA SCAN  08/03/1995  . INFLUENZA VACCINE  08/23/2017 (Originally 04/23/2016)  . PNA vac Low Risk Adult (1 of 2 - PCV13) 08/23/2017 (Originally 08/03/1995)  . HEMOGLOBIN A1C  03/29/2017    Medications: Current Outpatient Prescriptions on File Prior to Visit  Medication Sig Dispense Refill  . acetaminophen (TYLENOL) 325 MG tablet Take 650 mg by mouth every 6 (six) hours as needed for mild pain or moderate pain.     . Amino Acids-Protein Hydrolys (FEEDING SUPPLEMENT, PRO-STAT SUGAR FREE 64,) LIQD Take 30 mLs by mouth 3 (three) times daily with meals.    Marland Kitchen atorvastatin (LIPITOR) 40 MG tablet Take 40 mg by mouth every morning.     . Calcium Citrate-Vitamin D 315-250 MG-UNIT TABS Take 1 tablet by mouth daily.    . collagenase (SANTYL) ointment Apply 1 application topically as directed. Apply to sacral wound per tx order     . ferrous sulfate 325 (65 FE) MG tablet Take 325 mg by mouth  every evening.   5  . glipiZIDE (GLUCOTROL XL) 2.5 MG 24 hr tablet Take 2.5 mg by mouth every evening.     . insulin aspart (NOVOLOG FLEXPEN) 100 UNIT/ML FlexPen Inject 2-10 Units into the skin 4 (four) times daily -  before meals and at bedtime. Give per sliding scale:: If 60- Call MD 200-250= 2 units 251-300= 4 units 301-350= 6 units 351-400= 8 units Over400=10units 401+ Call MD    . metoprolol (LOPRESSOR) 50 MG tablet Take 1 tablet (50 mg total) by mouth 2 (two) times daily. 180 tablet 3  . Multiple Vitamin (MULTIVITAMIN) tablet Take 1 tablet by mouth daily.    Marland Kitchen PARoxetine (PAXIL) 20 MG tablet Take 20 mg by mouth every morning.     . ramipril (ALTACE) 5 MG capsule TAKE 1 CAPSULE BY  MOUTH DAILY 90 capsule 0  . warfarin (COUMADIN) 3 MG tablet Take 3 mg by mouth every evening.     No current facility-administered medications on file prior to visit.      Review of Systems   In general she is not complaining of any fever or chills.  Skin is not complaining of any itching does have venous stasis changes lower extremities.-Also appears to have some chronic bruising most noticeable upper extremities    Ears eyes nose mouth and throat does not complain of visual changes or sore throat.  Respiratory denies shortness breath or cough.  Cardiac does not complaining of chest pain does have some lower extremity edema appears relatively baseline today .  GI is not complaining of any abdominal discomfort nausea vomiting diarrhea constipation.  GU does not complain of dysuria again recent urine culture was negative.  Muscle skeletal is not complaining currently of joint pain.  Neurologic is not complaining of headache dizziness or syncope.  Psych has had some increased confusion again she did go to the ER where workup appear to be negative for an acute process she is pleasant and largely appropriate today although apparently still has some confusion    Vitals:    10/28/16 1507  BP: 136/66  Pulse: 79  Resp: 18  Temp: 98.6 F (37 C)  TempSrc: Oral  Weight is 192 pounds  Physical Exam Constitutional: She appears well-developedand well-nourished. Sitting comfortably in her wheelchair HENT:  Head: Normocephalic.  Mouth/Throat: Oropharynx is clear and moist.  Eyes: Pupils are equal, round, and reactive to light.  Neck: Neck supple.  Cardiovascular: Normal rate. An irregularly irregular rhythmpresent.  No murmurheard. Pulmonary/Chest: Effort normaland breath sounds normal. No respiratory distress. She has no wheezes. She has no rales.  Abdominal: Soft. Bowel sounds are normal. She exhibits no distension. There is no tenderness. There is no rebound.  Musculoskeletal:  Moderate edema B/L  appears relatively stable  with previous exam  Neurological: She is alert.   she was oriented to Self Pl.ace     omewhat confused the day of the week and year-was pleasant and appropriate   Skin-does have some chronic bruising  Most noticeable upper extremities also venous stasis changes lower extremities bilaterally there is a small amount of scaling.  Erythema but this does not appear to be concerning for cellulitis appears to be more venous stasis related  Psychiatric: She is pleasant please see neurologic above  Labs reviewed: Basic Metabolic Panel:  Recent Labs  16/10/96 0624  10/18/16 1955 10/21/16 0800 10/23/16 1443  NA 139  < > 139 141 139  K 3.3*  < > 4.8 4.5 4.4  CL 105  < > 106 106 105  CO2 27  < > 26 27 25   GLUCOSE 120*  < > 115* 95 164*  BUN 39*  < > 53* 49* 46*  CREATININE 1.57*  < > 1.78* 1.56* 1.49*  CALCIUM 7.9*  < > 9.0 9.0 9.0  MG 1.5*  --   --   --   --   < > = values in this interval not displayed. Liver Function Tests:  Recent Labs  09/29/16 1632 09/30/16 0513 10/23/16 1443  AST 24 21 28   ALT 16 14 23   ALKPHOS 132* 98 124  BILITOT 1.9* 1.1 2.0*  PROT 6.1* 5.1* 6.0*  ALBUMIN 2.7* 2.5* 3.2*   No results for  input(s): LIPASE, AMYLASE in the last 8760 hours. No results for input(s): AMMONIA in the last  8760 hours. CBC:  Recent Labs  07/17/16 1726 09/29/16 1632  10/07/16 0730 10/09/16 0500 10/23/16 1443  WBC 6.8 10.6*  < > 9.1 8.1 5.8  NEUTROABS 4.7 8.2*  --   --   --  3.8  HGB 10.9* 10.1*  < > 9.7* 10.0* 10.8*  HCT 33.5* 31.7*  < > 30.3*  TEST REQUEST RECEIVED WITHOUT APPROPRIATE SPECIMEN 30.3* 33.7*  MCV 88.2 93.2  < > 92.7 91.0 96.8  PLT 143* 192  < > 213 192 100*  < > = values in this interval not displayed. Cardiac Enzymes:  Recent Labs  10/23/16 1443  TROPONINI <0.03   BNP: Invalid input(s): POCBNP CBG:  Recent Labs  10/02/16 0724 10/02/16 1118 10/02/16 1620  GLUCAP 124* 146* 150*    Procedures and Imaging Studies During Stay: Dg Chest 2 View  Result Date: 10/23/2016 CLINICAL DATA:  Confusion, weakness, altered mental status EXAM: CHEST  2 VIEW COMPARISON:  09/29/2016 FINDINGS: Cardiomegaly again noted. Persistent small left pleural effusion with left basilar atelectasis or infiltrate. No pulmonary edema. Osteopenia and degenerative changes thoracic spine. Degenerative changes right shoulder. IMPRESSION: Persistent small left pleural effusion with left basilar atelectasis or infiltrate. Cardiomegaly again noted. No pulmonary edema. Electronically Signed   By: Natasha Mead M.D.   On: 10/23/2016 14:33   Ct Head Wo Contrast  Result Date: 10/23/2016 CLINICAL DATA:  83 year old female with history of altered mental status since 10/22/2016. EXAM: CT HEAD WITHOUT CONTRAST TECHNIQUE: Contiguous axial images were obtained from the base of the skull through the vertex without intravenous contrast. COMPARISON:  No priors. FINDINGS: Brain: No evidence of acute infarction, hemorrhage, hydrocephalus, extra-axial collection or mass lesion/mass effect. Mild cerebral atrophy. Patchy and confluent areas of decreased attenuation are noted throughout the deep and periventricular white matter  of the cerebral hemispheres bilaterally, compatible with chronic microvascular ischemic disease. Physiologic calcifications in the basal ganglia bilaterally (left greater than right). Vascular: Atherosclerotic calcifications in the cerebral vasculature, most notably in the vertebral arteries bilaterally. No definite aneurysm identified. No hyperdense vessel. Skull: Normal. Negative for fracture or focal lesion. Sinuses/Orbits: No acute finding. Other: None. IMPRESSION: 1. No acute intracranial abnormalities. 2. Mild cerebral atrophy with mild chronic microvascular ischemic changes in the cerebral white matter, as above. 3. Cerebrovascular atherosclerosis. Electronically Signed   By: Trudie Reed M.D.   On: 10/23/2016 14:45   Mr Brain Wo Contrast (neuro Protocol)  Result Date: 10/23/2016 CLINICAL DATA:  83 y/o  F; increased confusion weakness. EXAM: MRI HEAD WITHOUT CONTRAST TECHNIQUE: Multiplanar, multiecho pulse sequences of the brain and surrounding structures were obtained without intravenous contrast. COMPARISON:  10/23/2016 CT head FINDINGS: Brain: No acute infarction, hemorrhage, hydrocephalus, extra-axial collection or mass lesion. Foci of T2 FLAIR hyperintensity in subcortical periventricular white matter and within the left hemi pons are nonspecific but compatible with mild chronic microvascular ischemic changes. Mild brain parenchymal volume loss for age. Vascular: Normal flow voids. Skull and upper cervical spine: Normal marrow signal. Sinuses/Orbits: Negative. Other: None. IMPRESSION: 1. Moderate motion degradation of several sequences. 2. No acute intracranial abnormality. 3. Mild chronic microvascular ischemic changes and parenchymal volume loss of the brain for age. Electronically Signed   By: Mitzi Hansen M.D.   On: 10/23/2016 17:25   Dg Chest Port 1 View  Result Date: 09/29/2016 CLINICAL DATA:  83 year old with progressive generalized weakness over the past 2 weeks associated  with bilateral lower extremity weeping edema. EXAM: PORTABLE CHEST 1 VIEW COMPARISON:  None. FINDINGS: Cardiac  silhouette mildly enlarged for the AP portable technique, unchanged. Thoracic aorta atherosclerotic, unchanged. Hilar and mediastinal contours otherwise unremarkable. Dense consolidation in the left lower lobe associated with a left pleural effusion. Lungs otherwise clear. No visible right pleural effusion. Pulmonary vascularity normal. Severe degenerative changes involving the right shoulder joint with dense calcifications involving the capsule and/or the subacromial bursa. IMPRESSION: 1. Left lower lobe pneumonia and associated left pleural effusion. 2. Stable mild cardiomegaly without evidence of pulmonary edema. 3. Thoracic aortic atherosclerosis. Electronically Signed   By: Hulan Saashomas  Lawrence M.D.   On: 09/29/2016 17:13    Assessment/Plan:   . #1-history of CHF-she is on Lasix with potassium supplementation Lasix was just recently restarted-clinically she appears to be stable edema appears to be stable no complaints of shortness of breath-but this will have to be watched closely at her new facility-she has gained some weight although this appeared to have started when she was not on the Lasix again weights and clinical status will have to be watched carefully upon discharge.  #2 history diabetes type 2 as noted above this appears to be stable she is on low-dose glipizide.  #3 history of chronic kidney disease most recent creatinine 1.49 shows relative stability update BMP is pending  #4history of atrial fibrillation this appears rate controlled on Lopressor-Coumadin will be held tonight secondary to slightly supratherapeutic INR of 3.28-update INR tomorrow before discharge she will need follow-up at new facility and I suspect home health care involvement.  #5-history of anemia-hemoglobin has risen she is on iron most recently 10.8 will update CBC before discharge-there has been an order to  guaic stools-   Also noted platelets 100,000 which appear to be somewhat lower than her baseline again we will update this tomorrow for follow-up.  #6 history of pneumonia this appears to have resolved does not complain of increased cough or shortness of breath will need to be monitored.  #7 history of depression she is on Paxil this appears relatively stable.   Again she will be going to a private care home facility in 2 days-her INR will have to be monitored when she is discharged as well with primary care provider notified of results.  [id number]-of note greater than 30 minutes spent on this discharge summary-greater than 50% of time spent coordinating plan of care for numerous diagnoses-  Patient is being discharged with the following home health services:  She will need home health-PT and OT for further strengthening as well as nursing support for her medical issues  Patient is being discharged with the following durable medical equipment:  She will need a wheelchair to assist with ambulation secondary to continued fall risk with ambulation  Patient has been advised to f/u with their PCP in 1-2 weeks to bring them up to date on their rehab stay.  Social services at facility was responsible for arranging this appointment.  Pt was provided with a 30 day supply of prescriptions for medications and refills must be obtained from their PCP.  For controlled substances, a more limited supply may be provided adequate until PCP appointment only.  Future labs/tests needed:

## 2016-10-29 ENCOUNTER — Encounter (HOSPITAL_COMMUNITY)
Admission: RE | Admit: 2016-10-29 | Discharge: 2016-10-29 | Disposition: A | Discharge status: Home / Self Care | Payer: Medicare Other | Source: Skilled Nursing Facility | Attending: Internal Medicine | Admitting: Internal Medicine

## 2016-10-29 DIAGNOSIS — I482 Chronic atrial fibrillation: Secondary | ICD-10-CM | POA: Insufficient documentation

## 2016-10-29 DIAGNOSIS — E1122 Type 2 diabetes mellitus with diabetic chronic kidney disease: Secondary | ICD-10-CM | POA: Diagnosis not present

## 2016-10-29 DIAGNOSIS — J189 Pneumonia, unspecified organism: Secondary | ICD-10-CM | POA: Insufficient documentation

## 2016-10-29 DIAGNOSIS — Z7952 Long term (current) use of systemic steroids: Secondary | ICD-10-CM | POA: Insufficient documentation

## 2016-10-29 DIAGNOSIS — Z7984 Long term (current) use of oral hypoglycemic drugs: Secondary | ICD-10-CM | POA: Insufficient documentation

## 2016-10-29 DIAGNOSIS — M6281 Muscle weakness (generalized): Secondary | ICD-10-CM | POA: Diagnosis not present

## 2016-10-29 DIAGNOSIS — N184 Chronic kidney disease, stage 4 (severe): Secondary | ICD-10-CM | POA: Insufficient documentation

## 2016-10-29 DIAGNOSIS — Z79899 Other long term (current) drug therapy: Secondary | ICD-10-CM | POA: Diagnosis not present

## 2016-10-29 DIAGNOSIS — R600 Localized edema: Secondary | ICD-10-CM | POA: Insufficient documentation

## 2016-10-29 DIAGNOSIS — R279 Unspecified lack of coordination: Secondary | ICD-10-CM | POA: Diagnosis not present

## 2016-10-29 LAB — CBC WITH DIFFERENTIAL/PLATELET
BASOS ABS: 0 10*3/uL (ref 0.0–0.1)
BASOS PCT: 0 %
EOS ABS: 0.2 10*3/uL (ref 0.0–0.7)
Eosinophils Relative: 4 %
HCT: 35.4 % — ABNORMAL LOW (ref 36.0–46.0)
HEMOGLOBIN: 11 g/dL — AB (ref 12.0–15.0)
LYMPHS ABS: 1.6 10*3/uL (ref 0.7–4.0)
Lymphocytes Relative: 33 %
MCH: 30.6 pg (ref 26.0–34.0)
MCHC: 31.1 g/dL (ref 30.0–36.0)
MCV: 98.6 fL (ref 78.0–100.0)
Monocytes Absolute: 0.4 10*3/uL (ref 0.1–1.0)
Monocytes Relative: 8 %
NEUTROS ABS: 2.7 10*3/uL (ref 1.7–7.7)
Neutrophils Relative %: 55 %
Platelets: 98 10*3/uL — ABNORMAL LOW (ref 150–400)
RBC: 3.59 MIL/uL — ABNORMAL LOW (ref 3.87–5.11)
RDW: 21.3 % — AB (ref 11.5–15.5)
WBC: 4.9 10*3/uL (ref 4.0–10.5)

## 2016-10-29 LAB — BASIC METABOLIC PANEL
Anion gap: 7 (ref 5–15)
BUN: 55 mg/dL — AB (ref 6–20)
CALCIUM: 9 mg/dL (ref 8.9–10.3)
CO2: 27 mmol/L (ref 22–32)
CREATININE: 1.73 mg/dL — AB (ref 0.44–1.00)
Chloride: 107 mmol/L (ref 101–111)
GFR calc Af Amer: 30 mL/min — ABNORMAL LOW (ref >60)
GFR, EST NON AFRICAN AMERICAN: 26 mL/min — AB (ref >60)
GLUCOSE: 75 mg/dL (ref 65–99)
Potassium: 4.5 mmol/L (ref 3.5–5.1)
Sodium: 141 mmol/L (ref 135–145)

## 2016-10-29 LAB — PROTIME-INR
INR: 3.06
PROTHROMBIN TIME: 32.3 s — AB (ref 11.4–15.2)

## 2016-10-30 ENCOUNTER — Encounter (HOSPITAL_COMMUNITY)
Admission: RE | Admit: 2016-10-30 | Discharge: 2016-10-30 | Disposition: A | Discharge status: Home / Self Care | Payer: Medicare Other | Source: Skilled Nursing Facility | Attending: Internal Medicine | Admitting: Internal Medicine

## 2016-10-30 ENCOUNTER — Other Ambulatory Visit: Payer: Self-pay | Admitting: Licensed Clinical Social Worker

## 2016-10-30 DIAGNOSIS — R600 Localized edema: Secondary | ICD-10-CM | POA: Diagnosis not present

## 2016-10-30 DIAGNOSIS — Z7984 Long term (current) use of oral hypoglycemic drugs: Secondary | ICD-10-CM | POA: Diagnosis not present

## 2016-10-30 DIAGNOSIS — Z7952 Long term (current) use of systemic steroids: Secondary | ICD-10-CM | POA: Diagnosis not present

## 2016-10-30 DIAGNOSIS — J189 Pneumonia, unspecified organism: Secondary | ICD-10-CM | POA: Insufficient documentation

## 2016-10-30 DIAGNOSIS — Z79899 Other long term (current) drug therapy: Secondary | ICD-10-CM | POA: Diagnosis not present

## 2016-10-30 DIAGNOSIS — I482 Chronic atrial fibrillation: Secondary | ICD-10-CM | POA: Diagnosis not present

## 2016-10-30 LAB — BASIC METABOLIC PANEL
ANION GAP: 9 (ref 5–15)
BUN: 55 mg/dL — ABNORMAL HIGH (ref 6–20)
CALCIUM: 8.8 mg/dL — AB (ref 8.9–10.3)
CO2: 26 mmol/L (ref 22–32)
Chloride: 107 mmol/L (ref 101–111)
Creatinine, Ser: 1.81 mg/dL — ABNORMAL HIGH (ref 0.44–1.00)
GFR, EST AFRICAN AMERICAN: 28 mL/min — AB (ref >60)
GFR, EST NON AFRICAN AMERICAN: 24 mL/min — AB (ref >60)
GLUCOSE: 83 mg/dL (ref 65–99)
POTASSIUM: 4.3 mmol/L (ref 3.5–5.1)
Sodium: 142 mmol/L (ref 135–145)

## 2016-10-30 LAB — PROTIME-INR
INR: 3.46
PROTHROMBIN TIME: 35.6 s — AB (ref 11.4–15.2)

## 2016-10-30 NOTE — Patient Outreach (Signed)
Assessment:  CSW spoke via phone with client. CSW verified client identity. CSW received verbal permission from client on 10/30/16 for CSW to speak with client about current client needs and status.  C.ient has been receiving care in recent weeks at [facility] in [place], [place].  Client has been receiving nursing care and physical therapy support at [facility].  Client and family have decided for client to discharge from [facility] on 10/30/16 and to admit on 10/30/16 to [facility] in [place], [place].  Client said that nursing care and physical therapy services received at [facility] have been very helpful to client. CSW and client spoke of client care plan.CSW encouraged client to communicate with CSW in next 30 days to discuss community resources of assistance for client. Client said she was looking forward to moving [facility] 10[facility]. Client did not mention any pain issues. She said she was eating well and sleeping well. CSW had given client [facility] CSW card previously. CSW encouraged for client or her son or daughter to call CSW at [phone] as needed to discuss social work needs of client. Client was appreciative of call from CSW on 10/30/16.    Plan:  Client to communicate with CSW in next 30 days to discuss community resources of assistance for client.  CSW to call client in 4 weeks to assess client needs at that time.    Kelton PillarMichael S.Timouthy Gilardi MSW, LCSW Licensed Clinical Social Worker [facility] Care Management [phone]

## 2016-10-31 ENCOUNTER — Ambulatory Visit (INDEPENDENT_AMBULATORY_CARE_PROVIDER_SITE_OTHER): Payer: PRIVATE HEALTH INSURANCE | Admitting: *Deleted

## 2016-10-31 DIAGNOSIS — R0602 Shortness of breath: Secondary | ICD-10-CM | POA: Diagnosis not present

## 2016-10-31 DIAGNOSIS — N189 Chronic kidney disease, unspecified: Secondary | ICD-10-CM | POA: Diagnosis not present

## 2016-10-31 DIAGNOSIS — E782 Mixed hyperlipidemia: Secondary | ICD-10-CM | POA: Diagnosis not present

## 2016-10-31 DIAGNOSIS — I10 Essential (primary) hypertension: Secondary | ICD-10-CM | POA: Diagnosis not present

## 2016-10-31 DIAGNOSIS — Z7984 Long term (current) use of oral hypoglycemic drugs: Secondary | ICD-10-CM | POA: Diagnosis not present

## 2016-10-31 DIAGNOSIS — E1122 Type 2 diabetes mellitus with diabetic chronic kidney disease: Secondary | ICD-10-CM | POA: Diagnosis not present

## 2016-10-31 DIAGNOSIS — I482 Chronic atrial fibrillation: Secondary | ICD-10-CM | POA: Diagnosis not present

## 2016-10-31 DIAGNOSIS — R944 Abnormal results of kidney function studies: Secondary | ICD-10-CM | POA: Diagnosis not present

## 2016-10-31 DIAGNOSIS — Z7901 Long term (current) use of anticoagulants: Secondary | ICD-10-CM | POA: Diagnosis not present

## 2016-10-31 DIAGNOSIS — D509 Iron deficiency anemia, unspecified: Secondary | ICD-10-CM | POA: Diagnosis not present

## 2016-10-31 DIAGNOSIS — D649 Anemia, unspecified: Secondary | ICD-10-CM | POA: Diagnosis not present

## 2016-10-31 DIAGNOSIS — I5032 Chronic diastolic (congestive) heart failure: Secondary | ICD-10-CM | POA: Diagnosis not present

## 2016-10-31 DIAGNOSIS — L89321 Pressure ulcer of left buttock, stage 1: Secondary | ICD-10-CM | POA: Diagnosis not present

## 2016-10-31 DIAGNOSIS — Z5181 Encounter for therapeutic drug level monitoring: Secondary | ICD-10-CM

## 2016-10-31 DIAGNOSIS — E46 Unspecified protein-calorie malnutrition: Secondary | ICD-10-CM | POA: Diagnosis not present

## 2016-10-31 DIAGNOSIS — F329 Major depressive disorder, single episode, unspecified: Secondary | ICD-10-CM | POA: Diagnosis not present

## 2016-10-31 DIAGNOSIS — Z8701 Personal history of pneumonia (recurrent): Secondary | ICD-10-CM | POA: Diagnosis not present

## 2016-10-31 DIAGNOSIS — I4891 Unspecified atrial fibrillation: Secondary | ICD-10-CM | POA: Diagnosis not present

## 2016-10-31 DIAGNOSIS — L89621 Pressure ulcer of left heel, stage 1: Secondary | ICD-10-CM | POA: Diagnosis not present

## 2016-10-31 DIAGNOSIS — I130 Hypertensive heart and chronic kidney disease with heart failure and stage 1 through stage 4 chronic kidney disease, or unspecified chronic kidney disease: Secondary | ICD-10-CM | POA: Diagnosis not present

## 2016-10-31 LAB — POCT INR: INR: 4.8

## 2016-11-01 ENCOUNTER — Other Ambulatory Visit (HOSPITAL_COMMUNITY)
Admission: RE | Admit: 2016-11-01 | Discharge: 2016-11-01 | Disposition: A | Discharge status: Home / Self Care | Payer: Medicare Other | Source: Other Acute Inpatient Hospital | Attending: Dermatology | Admitting: Dermatology

## 2016-11-01 ENCOUNTER — Telehealth: Payer: Self-pay | Admitting: Pharmacist

## 2016-11-01 DIAGNOSIS — L89621 Pressure ulcer of left heel, stage 1: Secondary | ICD-10-CM | POA: Diagnosis not present

## 2016-11-01 DIAGNOSIS — I5032 Chronic diastolic (congestive) heart failure: Secondary | ICD-10-CM | POA: Diagnosis not present

## 2016-11-01 DIAGNOSIS — I130 Hypertensive heart and chronic kidney disease with heart failure and stage 1 through stage 4 chronic kidney disease, or unspecified chronic kidney disease: Secondary | ICD-10-CM | POA: Diagnosis not present

## 2016-11-01 DIAGNOSIS — N189 Chronic kidney disease, unspecified: Secondary | ICD-10-CM | POA: Insufficient documentation

## 2016-11-01 DIAGNOSIS — L89321 Pressure ulcer of left buttock, stage 1: Secondary | ICD-10-CM | POA: Diagnosis not present

## 2016-11-01 DIAGNOSIS — I509 Heart failure, unspecified: Secondary | ICD-10-CM | POA: Insufficient documentation

## 2016-11-01 DIAGNOSIS — E1122 Type 2 diabetes mellitus with diabetic chronic kidney disease: Secondary | ICD-10-CM | POA: Diagnosis not present

## 2016-11-01 LAB — CBC
HCT: 39.9 % (ref 36.0–46.0)
Hemoglobin: 12.3 g/dL (ref 12.0–15.0)
MCH: 31 pg (ref 26.0–34.0)
MCHC: 30.8 g/dL (ref 30.0–36.0)
MCV: 100.5 fL — AB (ref 78.0–100.0)
PLATELETS: 104 10*3/uL — AB (ref 150–400)
RBC: 3.97 MIL/uL (ref 3.87–5.11)
RDW: 21.4 % — AB (ref 11.5–15.5)
WBC: 5.1 10*3/uL (ref 4.0–10.5)

## 2016-11-01 LAB — BASIC METABOLIC PANEL
Anion gap: 9 (ref 5–15)
BUN: 54 mg/dL — AB (ref 6–20)
CHLORIDE: 111 mmol/L (ref 101–111)
CO2: 25 mmol/L (ref 22–32)
CREATININE: 1.5 mg/dL — AB (ref 0.44–1.00)
Calcium: 9.1 mg/dL (ref 8.9–10.3)
GFR calc Af Amer: 35 mL/min — ABNORMAL LOW (ref >60)
GFR, EST NON AFRICAN AMERICAN: 30 mL/min — AB (ref >60)
Glucose, Bld: 120 mg/dL — ABNORMAL HIGH (ref 65–99)
Potassium: 4.6 mmol/L (ref 3.5–5.1)
SODIUM: 145 mmol/L (ref 135–145)

## 2016-11-01 NOTE — Telephone Encounter (Signed)
Received a call from RN with [facility] where pt resides with request for us to call [facility] (#[phone]) and give them a verbal order with Coumadin instructions since this was not received yesterday. Verbal order given to pharmacist there.

## 2016-11-04 DIAGNOSIS — E1122 Type 2 diabetes mellitus with diabetic chronic kidney disease: Secondary | ICD-10-CM | POA: Diagnosis not present

## 2016-11-04 DIAGNOSIS — L89621 Pressure ulcer of left heel, stage 1: Secondary | ICD-10-CM | POA: Diagnosis not present

## 2016-11-04 DIAGNOSIS — L89321 Pressure ulcer of left buttock, stage 1: Secondary | ICD-10-CM | POA: Diagnosis not present

## 2016-11-04 DIAGNOSIS — I130 Hypertensive heart and chronic kidney disease with heart failure and stage 1 through stage 4 chronic kidney disease, or unspecified chronic kidney disease: Secondary | ICD-10-CM | POA: Diagnosis not present

## 2016-11-04 DIAGNOSIS — N189 Chronic kidney disease, unspecified: Secondary | ICD-10-CM | POA: Diagnosis not present

## 2016-11-04 DIAGNOSIS — I5032 Chronic diastolic (congestive) heart failure: Secondary | ICD-10-CM | POA: Diagnosis not present

## 2016-11-05 DIAGNOSIS — I130 Hypertensive heart and chronic kidney disease with heart failure and stage 1 through stage 4 chronic kidney disease, or unspecified chronic kidney disease: Secondary | ICD-10-CM | POA: Diagnosis not present

## 2016-11-05 DIAGNOSIS — E1122 Type 2 diabetes mellitus with diabetic chronic kidney disease: Secondary | ICD-10-CM | POA: Diagnosis not present

## 2016-11-05 DIAGNOSIS — L89321 Pressure ulcer of left buttock, stage 1: Secondary | ICD-10-CM | POA: Diagnosis not present

## 2016-11-05 DIAGNOSIS — L89621 Pressure ulcer of left heel, stage 1: Secondary | ICD-10-CM | POA: Diagnosis not present

## 2016-11-05 DIAGNOSIS — N189 Chronic kidney disease, unspecified: Secondary | ICD-10-CM | POA: Diagnosis not present

## 2016-11-05 DIAGNOSIS — I5032 Chronic diastolic (congestive) heart failure: Secondary | ICD-10-CM | POA: Diagnosis not present

## 2016-11-06 ENCOUNTER — Ambulatory Visit (INDEPENDENT_AMBULATORY_CARE_PROVIDER_SITE_OTHER): Payer: PRIVATE HEALTH INSURANCE | Admitting: *Deleted

## 2016-11-06 DIAGNOSIS — L89321 Pressure ulcer of left buttock, stage 1: Secondary | ICD-10-CM | POA: Diagnosis not present

## 2016-11-06 DIAGNOSIS — E1122 Type 2 diabetes mellitus with diabetic chronic kidney disease: Secondary | ICD-10-CM | POA: Diagnosis not present

## 2016-11-06 DIAGNOSIS — Z09 Encounter for follow-up examination after completed treatment for conditions other than malignant neoplasm: Secondary | ICD-10-CM | POA: Diagnosis not present

## 2016-11-06 DIAGNOSIS — I4891 Unspecified atrial fibrillation: Secondary | ICD-10-CM

## 2016-11-06 DIAGNOSIS — N183 Chronic kidney disease, stage 3 (moderate): Secondary | ICD-10-CM | POA: Diagnosis not present

## 2016-11-06 DIAGNOSIS — J189 Pneumonia, unspecified organism: Secondary | ICD-10-CM | POA: Diagnosis not present

## 2016-11-06 DIAGNOSIS — I5032 Chronic diastolic (congestive) heart failure: Secondary | ICD-10-CM | POA: Diagnosis not present

## 2016-11-06 DIAGNOSIS — Z5181 Encounter for therapeutic drug level monitoring: Secondary | ICD-10-CM

## 2016-11-06 DIAGNOSIS — I5030 Unspecified diastolic (congestive) heart failure: Secondary | ICD-10-CM | POA: Diagnosis not present

## 2016-11-06 DIAGNOSIS — I130 Hypertensive heart and chronic kidney disease with heart failure and stage 1 through stage 4 chronic kidney disease, or unspecified chronic kidney disease: Secondary | ICD-10-CM | POA: Diagnosis not present

## 2016-11-06 DIAGNOSIS — N189 Chronic kidney disease, unspecified: Secondary | ICD-10-CM | POA: Diagnosis not present

## 2016-11-06 DIAGNOSIS — L89621 Pressure ulcer of left heel, stage 1: Secondary | ICD-10-CM | POA: Diagnosis not present

## 2016-11-06 LAB — POCT INR: INR: 2.4

## 2016-11-07 DIAGNOSIS — I5032 Chronic diastolic (congestive) heart failure: Secondary | ICD-10-CM | POA: Diagnosis not present

## 2016-11-07 DIAGNOSIS — L89321 Pressure ulcer of left buttock, stage 1: Secondary | ICD-10-CM | POA: Diagnosis not present

## 2016-11-07 DIAGNOSIS — E1122 Type 2 diabetes mellitus with diabetic chronic kidney disease: Secondary | ICD-10-CM | POA: Diagnosis not present

## 2016-11-07 DIAGNOSIS — I130 Hypertensive heart and chronic kidney disease with heart failure and stage 1 through stage 4 chronic kidney disease, or unspecified chronic kidney disease: Secondary | ICD-10-CM | POA: Diagnosis not present

## 2016-11-07 DIAGNOSIS — L89621 Pressure ulcer of left heel, stage 1: Secondary | ICD-10-CM | POA: Diagnosis not present

## 2016-11-07 DIAGNOSIS — N189 Chronic kidney disease, unspecified: Secondary | ICD-10-CM | POA: Diagnosis not present

## 2016-11-11 ENCOUNTER — Ambulatory Visit (INDEPENDENT_AMBULATORY_CARE_PROVIDER_SITE_OTHER): Payer: PRIVATE HEALTH INSURANCE | Admitting: *Deleted

## 2016-11-11 DIAGNOSIS — I5032 Chronic diastolic (congestive) heart failure: Secondary | ICD-10-CM | POA: Diagnosis not present

## 2016-11-11 DIAGNOSIS — E1122 Type 2 diabetes mellitus with diabetic chronic kidney disease: Secondary | ICD-10-CM | POA: Diagnosis not present

## 2016-11-11 DIAGNOSIS — Z5181 Encounter for therapeutic drug level monitoring: Secondary | ICD-10-CM

## 2016-11-11 DIAGNOSIS — L89321 Pressure ulcer of left buttock, stage 1: Secondary | ICD-10-CM | POA: Diagnosis not present

## 2016-11-11 DIAGNOSIS — L89621 Pressure ulcer of left heel, stage 1: Secondary | ICD-10-CM | POA: Diagnosis not present

## 2016-11-11 DIAGNOSIS — I130 Hypertensive heart and chronic kidney disease with heart failure and stage 1 through stage 4 chronic kidney disease, or unspecified chronic kidney disease: Secondary | ICD-10-CM | POA: Diagnosis not present

## 2016-11-11 DIAGNOSIS — N189 Chronic kidney disease, unspecified: Secondary | ICD-10-CM | POA: Diagnosis not present

## 2016-11-11 LAB — POCT INR: INR: 2.6

## 2016-11-12 DIAGNOSIS — I5032 Chronic diastolic (congestive) heart failure: Secondary | ICD-10-CM | POA: Diagnosis not present

## 2016-11-12 DIAGNOSIS — I130 Hypertensive heart and chronic kidney disease with heart failure and stage 1 through stage 4 chronic kidney disease, or unspecified chronic kidney disease: Secondary | ICD-10-CM | POA: Diagnosis not present

## 2016-11-12 DIAGNOSIS — L89621 Pressure ulcer of left heel, stage 1: Secondary | ICD-10-CM | POA: Diagnosis not present

## 2016-11-12 DIAGNOSIS — L89321 Pressure ulcer of left buttock, stage 1: Secondary | ICD-10-CM | POA: Diagnosis not present

## 2016-11-12 DIAGNOSIS — N189 Chronic kidney disease, unspecified: Secondary | ICD-10-CM | POA: Diagnosis not present

## 2016-11-12 DIAGNOSIS — E1122 Type 2 diabetes mellitus with diabetic chronic kidney disease: Secondary | ICD-10-CM | POA: Diagnosis not present

## 2016-11-14 DIAGNOSIS — E1122 Type 2 diabetes mellitus with diabetic chronic kidney disease: Secondary | ICD-10-CM | POA: Diagnosis not present

## 2016-11-14 DIAGNOSIS — L89321 Pressure ulcer of left buttock, stage 1: Secondary | ICD-10-CM | POA: Diagnosis not present

## 2016-11-14 DIAGNOSIS — N189 Chronic kidney disease, unspecified: Secondary | ICD-10-CM | POA: Diagnosis not present

## 2016-11-14 DIAGNOSIS — L89621 Pressure ulcer of left heel, stage 1: Secondary | ICD-10-CM | POA: Diagnosis not present

## 2016-11-14 DIAGNOSIS — I5032 Chronic diastolic (congestive) heart failure: Secondary | ICD-10-CM | POA: Diagnosis not present

## 2016-11-14 DIAGNOSIS — I130 Hypertensive heart and chronic kidney disease with heart failure and stage 1 through stage 4 chronic kidney disease, or unspecified chronic kidney disease: Secondary | ICD-10-CM | POA: Diagnosis not present

## 2016-11-18 ENCOUNTER — Ambulatory Visit (INDEPENDENT_AMBULATORY_CARE_PROVIDER_SITE_OTHER): Payer: PRIVATE HEALTH INSURANCE | Admitting: *Deleted

## 2016-11-18 ENCOUNTER — Telehealth: Payer: Self-pay | Admitting: *Deleted

## 2016-11-18 DIAGNOSIS — Z5181 Encounter for therapeutic drug level monitoring: Secondary | ICD-10-CM

## 2016-11-18 DIAGNOSIS — L89621 Pressure ulcer of left heel, stage 1: Secondary | ICD-10-CM | POA: Diagnosis not present

## 2016-11-18 DIAGNOSIS — N189 Chronic kidney disease, unspecified: Secondary | ICD-10-CM | POA: Diagnosis not present

## 2016-11-18 DIAGNOSIS — I5032 Chronic diastolic (congestive) heart failure: Secondary | ICD-10-CM | POA: Diagnosis not present

## 2016-11-18 DIAGNOSIS — I130 Hypertensive heart and chronic kidney disease with heart failure and stage 1 through stage 4 chronic kidney disease, or unspecified chronic kidney disease: Secondary | ICD-10-CM | POA: Diagnosis not present

## 2016-11-18 DIAGNOSIS — L89321 Pressure ulcer of left buttock, stage 1: Secondary | ICD-10-CM | POA: Diagnosis not present

## 2016-11-18 DIAGNOSIS — E1122 Type 2 diabetes mellitus with diabetic chronic kidney disease: Secondary | ICD-10-CM | POA: Diagnosis not present

## 2016-11-18 LAB — POCT INR: INR: 2.1

## 2016-11-18 NOTE — Telephone Encounter (Signed)
Spoke with Kathy.  See coumadin note. 

## 2016-11-18 NOTE — Telephone Encounter (Signed)
INR 2.1 PT 25.5 finger stick Taking 2.5mg  daily  Please call Oleg[facility]Kathy [facility] [phone]

## 2016-11-20 DIAGNOSIS — L89621 Pressure ulcer of left heel, stage 1: Secondary | ICD-10-CM | POA: Diagnosis not present

## 2016-11-20 DIAGNOSIS — N189 Chronic kidney disease, unspecified: Secondary | ICD-10-CM | POA: Diagnosis not present

## 2016-11-20 DIAGNOSIS — L89321 Pressure ulcer of left buttock, stage 1: Secondary | ICD-10-CM | POA: Diagnosis not present

## 2016-11-20 DIAGNOSIS — I130 Hypertensive heart and chronic kidney disease with heart failure and stage 1 through stage 4 chronic kidney disease, or unspecified chronic kidney disease: Secondary | ICD-10-CM | POA: Diagnosis not present

## 2016-11-20 DIAGNOSIS — I5032 Chronic diastolic (congestive) heart failure: Secondary | ICD-10-CM | POA: Diagnosis not present

## 2016-11-20 DIAGNOSIS — E1122 Type 2 diabetes mellitus with diabetic chronic kidney disease: Secondary | ICD-10-CM | POA: Diagnosis not present

## 2016-11-21 DIAGNOSIS — E1122 Type 2 diabetes mellitus with diabetic chronic kidney disease: Secondary | ICD-10-CM | POA: Diagnosis not present

## 2016-11-21 DIAGNOSIS — N189 Chronic kidney disease, unspecified: Secondary | ICD-10-CM | POA: Diagnosis not present

## 2016-11-21 DIAGNOSIS — I5032 Chronic diastolic (congestive) heart failure: Secondary | ICD-10-CM | POA: Diagnosis not present

## 2016-11-21 DIAGNOSIS — L89321 Pressure ulcer of left buttock, stage 1: Secondary | ICD-10-CM | POA: Diagnosis not present

## 2016-11-21 DIAGNOSIS — L89621 Pressure ulcer of left heel, stage 1: Secondary | ICD-10-CM | POA: Diagnosis not present

## 2016-11-21 DIAGNOSIS — I130 Hypertensive heart and chronic kidney disease with heart failure and stage 1 through stage 4 chronic kidney disease, or unspecified chronic kidney disease: Secondary | ICD-10-CM | POA: Diagnosis not present

## 2016-11-25 ENCOUNTER — Other Ambulatory Visit: Payer: Self-pay | Admitting: Licensed Clinical Social Worker

## 2016-11-25 ENCOUNTER — Ambulatory Visit (INDEPENDENT_AMBULATORY_CARE_PROVIDER_SITE_OTHER): Payer: PRIVATE HEALTH INSURANCE | Admitting: *Deleted

## 2016-11-25 ENCOUNTER — Telehealth: Payer: Self-pay | Admitting: *Deleted

## 2016-11-25 DIAGNOSIS — L89321 Pressure ulcer of left buttock, stage 1: Secondary | ICD-10-CM | POA: Diagnosis not present

## 2016-11-25 DIAGNOSIS — N189 Chronic kidney disease, unspecified: Secondary | ICD-10-CM | POA: Diagnosis not present

## 2016-11-25 DIAGNOSIS — L89621 Pressure ulcer of left heel, stage 1: Secondary | ICD-10-CM | POA: Diagnosis not present

## 2016-11-25 DIAGNOSIS — E1122 Type 2 diabetes mellitus with diabetic chronic kidney disease: Secondary | ICD-10-CM | POA: Diagnosis not present

## 2016-11-25 DIAGNOSIS — Z5181 Encounter for therapeutic drug level monitoring: Secondary | ICD-10-CM

## 2016-11-25 DIAGNOSIS — I5032 Chronic diastolic (congestive) heart failure: Secondary | ICD-10-CM | POA: Diagnosis not present

## 2016-11-25 DIAGNOSIS — I130 Hypertensive heart and chronic kidney disease with heart failure and stage 1 through stage 4 chronic kidney disease, or unspecified chronic kidney disease: Secondary | ICD-10-CM | POA: Diagnosis not present

## 2016-11-25 LAB — POCT INR: INR: 1.8

## 2016-11-25 NOTE — Telephone Encounter (Signed)
Spoke with EMCORCathy.  See coumadin note.

## 2016-11-25 NOTE — Telephone Encounter (Signed)
INR 1.8 PT 21.4      Taking 2.5 mg daily  Per Olegario MessierKathy [phone]

## 2016-11-25 NOTE — Patient Outreach (Signed)
Assessment:  CSW spoke via phone with client. CSW verified client identity.  CSW received verbal permission from client on 11/25/16 for CSW to speak with client about current client needs. Client sees Dr. Margo AyeHall as primary care doctor. Client did not mention any pain issues. Client had been receiving care at [facility] in [place], [place]. She discharged from [facility] on 10/30/16 and admitted on 10/30/16 to [facility] [place]. CSW and client spoke of client care plan. CSW encouraged client to communicate with CSW in the  next 30 days to discuss community resources of assistance for client . Client said she had been eating adequately and had been sleeping well.  Client has support from her son and from her daughter. Client has been attending client medical appointments as scheduled.  [facility], client is receiving nursing care and is receiving physical therapy sessions as scheduled. Physical therapy sessions for client at facility are helpful to client.  Client and family are not sure, at present, how long client may need to receive [facility]. CSW encouraged that client or client's son or client's daughter call CSW at [phone] as needed to discuss social work needs of client.   Plan:  Client to communicate with CSW in next 30 days to discuss community resources of assistance for client.  CSW to call client in 4 weeks to assess client needs at that time.  Kelton PillarMichael S.Talbot Monarch MSW, LCSW Licensed Clinical Social Worker [facility] Care Management [phone]

## 2016-11-27 DIAGNOSIS — E1122 Type 2 diabetes mellitus with diabetic chronic kidney disease: Secondary | ICD-10-CM | POA: Diagnosis not present

## 2016-11-27 DIAGNOSIS — N189 Chronic kidney disease, unspecified: Secondary | ICD-10-CM | POA: Diagnosis not present

## 2016-11-27 DIAGNOSIS — L89321 Pressure ulcer of left buttock, stage 1: Secondary | ICD-10-CM | POA: Diagnosis not present

## 2016-11-27 DIAGNOSIS — L89621 Pressure ulcer of left heel, stage 1: Secondary | ICD-10-CM | POA: Diagnosis not present

## 2016-11-27 DIAGNOSIS — I5032 Chronic diastolic (congestive) heart failure: Secondary | ICD-10-CM | POA: Diagnosis not present

## 2016-11-27 DIAGNOSIS — I130 Hypertensive heart and chronic kidney disease with heart failure and stage 1 through stage 4 chronic kidney disease, or unspecified chronic kidney disease: Secondary | ICD-10-CM | POA: Diagnosis not present

## 2016-11-29 DIAGNOSIS — I130 Hypertensive heart and chronic kidney disease with heart failure and stage 1 through stage 4 chronic kidney disease, or unspecified chronic kidney disease: Secondary | ICD-10-CM | POA: Diagnosis not present

## 2016-11-29 DIAGNOSIS — L89621 Pressure ulcer of left heel, stage 1: Secondary | ICD-10-CM | POA: Diagnosis not present

## 2016-11-29 DIAGNOSIS — E1122 Type 2 diabetes mellitus with diabetic chronic kidney disease: Secondary | ICD-10-CM | POA: Diagnosis not present

## 2016-11-29 DIAGNOSIS — N189 Chronic kidney disease, unspecified: Secondary | ICD-10-CM | POA: Diagnosis not present

## 2016-11-29 DIAGNOSIS — I5032 Chronic diastolic (congestive) heart failure: Secondary | ICD-10-CM | POA: Diagnosis not present

## 2016-11-29 DIAGNOSIS — L89321 Pressure ulcer of left buttock, stage 1: Secondary | ICD-10-CM | POA: Diagnosis not present

## 2016-12-02 DIAGNOSIS — L89621 Pressure ulcer of left heel, stage 1: Secondary | ICD-10-CM | POA: Diagnosis not present

## 2016-12-02 DIAGNOSIS — L89321 Pressure ulcer of left buttock, stage 1: Secondary | ICD-10-CM | POA: Diagnosis not present

## 2016-12-02 DIAGNOSIS — N189 Chronic kidney disease, unspecified: Secondary | ICD-10-CM | POA: Diagnosis not present

## 2016-12-02 DIAGNOSIS — E1122 Type 2 diabetes mellitus with diabetic chronic kidney disease: Secondary | ICD-10-CM | POA: Diagnosis not present

## 2016-12-02 DIAGNOSIS — I5032 Chronic diastolic (congestive) heart failure: Secondary | ICD-10-CM | POA: Diagnosis not present

## 2016-12-02 DIAGNOSIS — I130 Hypertensive heart and chronic kidney disease with heart failure and stage 1 through stage 4 chronic kidney disease, or unspecified chronic kidney disease: Secondary | ICD-10-CM | POA: Diagnosis not present

## 2016-12-03 ENCOUNTER — Other Ambulatory Visit: Payer: Self-pay | Admitting: Cardiovascular Disease

## 2016-12-03 ENCOUNTER — Ambulatory Visit (INDEPENDENT_AMBULATORY_CARE_PROVIDER_SITE_OTHER): Payer: PRIVATE HEALTH INSURANCE | Admitting: *Deleted

## 2016-12-03 DIAGNOSIS — Z5181 Encounter for therapeutic drug level monitoring: Secondary | ICD-10-CM

## 2016-12-03 LAB — POCT INR: INR: 2.2

## 2016-12-05 DIAGNOSIS — N390 Urinary tract infection, site not specified: Secondary | ICD-10-CM | POA: Diagnosis not present

## 2016-12-09 DIAGNOSIS — L89621 Pressure ulcer of left heel, stage 1: Secondary | ICD-10-CM | POA: Diagnosis not present

## 2016-12-09 DIAGNOSIS — N189 Chronic kidney disease, unspecified: Secondary | ICD-10-CM | POA: Diagnosis not present

## 2016-12-09 DIAGNOSIS — L89321 Pressure ulcer of left buttock, stage 1: Secondary | ICD-10-CM | POA: Diagnosis not present

## 2016-12-09 DIAGNOSIS — I130 Hypertensive heart and chronic kidney disease with heart failure and stage 1 through stage 4 chronic kidney disease, or unspecified chronic kidney disease: Secondary | ICD-10-CM | POA: Diagnosis not present

## 2016-12-09 DIAGNOSIS — I5032 Chronic diastolic (congestive) heart failure: Secondary | ICD-10-CM | POA: Diagnosis not present

## 2016-12-09 DIAGNOSIS — E1122 Type 2 diabetes mellitus with diabetic chronic kidney disease: Secondary | ICD-10-CM | POA: Diagnosis not present

## 2016-12-17 ENCOUNTER — Ambulatory Visit (INDEPENDENT_AMBULATORY_CARE_PROVIDER_SITE_OTHER): Payer: PRIVATE HEALTH INSURANCE | Admitting: *Deleted

## 2016-12-17 DIAGNOSIS — L89321 Pressure ulcer of left buttock, stage 1: Secondary | ICD-10-CM | POA: Diagnosis not present

## 2016-12-17 DIAGNOSIS — E1122 Type 2 diabetes mellitus with diabetic chronic kidney disease: Secondary | ICD-10-CM | POA: Diagnosis not present

## 2016-12-17 DIAGNOSIS — I5032 Chronic diastolic (congestive) heart failure: Secondary | ICD-10-CM | POA: Diagnosis not present

## 2016-12-17 DIAGNOSIS — I130 Hypertensive heart and chronic kidney disease with heart failure and stage 1 through stage 4 chronic kidney disease, or unspecified chronic kidney disease: Secondary | ICD-10-CM | POA: Diagnosis not present

## 2016-12-17 DIAGNOSIS — L89621 Pressure ulcer of left heel, stage 1: Secondary | ICD-10-CM | POA: Diagnosis not present

## 2016-12-17 DIAGNOSIS — Z5181 Encounter for therapeutic drug level monitoring: Secondary | ICD-10-CM

## 2016-12-17 DIAGNOSIS — N189 Chronic kidney disease, unspecified: Secondary | ICD-10-CM | POA: Diagnosis not present

## 2016-12-17 LAB — POCT INR: INR: 1.9

## 2016-12-24 ENCOUNTER — Ambulatory Visit (INDEPENDENT_AMBULATORY_CARE_PROVIDER_SITE_OTHER): Payer: PRIVATE HEALTH INSURANCE | Admitting: *Deleted

## 2016-12-24 ENCOUNTER — Telehealth: Payer: Self-pay | Admitting: *Deleted

## 2016-12-24 DIAGNOSIS — N189 Chronic kidney disease, unspecified: Secondary | ICD-10-CM | POA: Diagnosis not present

## 2016-12-24 DIAGNOSIS — L89621 Pressure ulcer of left heel, stage 1: Secondary | ICD-10-CM | POA: Diagnosis not present

## 2016-12-24 DIAGNOSIS — L89321 Pressure ulcer of left buttock, stage 1: Secondary | ICD-10-CM | POA: Diagnosis not present

## 2016-12-24 DIAGNOSIS — I5032 Chronic diastolic (congestive) heart failure: Secondary | ICD-10-CM | POA: Diagnosis not present

## 2016-12-24 DIAGNOSIS — E1122 Type 2 diabetes mellitus with diabetic chronic kidney disease: Secondary | ICD-10-CM | POA: Diagnosis not present

## 2016-12-24 DIAGNOSIS — I130 Hypertensive heart and chronic kidney disease with heart failure and stage 1 through stage 4 chronic kidney disease, or unspecified chronic kidney disease: Secondary | ICD-10-CM | POA: Diagnosis not present

## 2016-12-24 DIAGNOSIS — Z5181 Encounter for therapeutic drug level monitoring: Secondary | ICD-10-CM

## 2016-12-24 LAB — POCT INR: INR: 2

## 2016-12-24 NOTE — Telephone Encounter (Signed)
INR 2.0 PT 24.2 taking 2.5 mg daily per [facility] w/ [facility] [phone], she is also discharging the pt today so any future visits will need to be done in office

## 2016-12-24 NOTE — Telephone Encounter (Signed)
Done.  See coumadin note. 

## 2016-12-26 DIAGNOSIS — F0391 Unspecified dementia with behavioral disturbance: Secondary | ICD-10-CM | POA: Diagnosis not present

## 2016-12-26 DIAGNOSIS — F05 Delirium due to known physiological condition: Secondary | ICD-10-CM | POA: Diagnosis not present

## 2016-12-26 DIAGNOSIS — N390 Urinary tract infection, site not specified: Secondary | ICD-10-CM | POA: Diagnosis not present

## 2016-12-30 DIAGNOSIS — R0601 Orthopnea: Secondary | ICD-10-CM | POA: Diagnosis not present

## 2016-12-30 DIAGNOSIS — R600 Localized edema: Secondary | ICD-10-CM | POA: Diagnosis not present

## 2016-12-31 NOTE — Progress Notes (Signed)
This encounter was created in error - please disregard.

## 2017-01-08 ENCOUNTER — Other Ambulatory Visit: Payer: Self-pay | Admitting: *Deleted

## 2017-01-08 ENCOUNTER — Ambulatory Visit (INDEPENDENT_AMBULATORY_CARE_PROVIDER_SITE_OTHER): Payer: Medicare Other | Admitting: *Deleted

## 2017-01-08 DIAGNOSIS — Z5181 Encounter for therapeutic drug level monitoring: Secondary | ICD-10-CM

## 2017-01-08 DIAGNOSIS — I4891 Unspecified atrial fibrillation: Secondary | ICD-10-CM | POA: Diagnosis not present

## 2017-01-08 LAB — POCT INR: INR: 2.3

## 2017-01-14 ENCOUNTER — Encounter (HOSPITAL_COMMUNITY): Payer: Self-pay | Admitting: Emergency Medicine

## 2017-01-14 ENCOUNTER — Emergency Department (HOSPITAL_COMMUNITY)
Admission: EM | Admit: 2017-01-14 | Discharge: 2017-01-14 | Disposition: A | Discharge status: Home / Self Care | Payer: Medicare Other | Attending: Emergency Medicine | Admitting: Emergency Medicine

## 2017-01-14 ENCOUNTER — Emergency Department (HOSPITAL_COMMUNITY): Payer: Medicare Other

## 2017-01-14 DIAGNOSIS — F5101 Primary insomnia: Secondary | ICD-10-CM | POA: Insufficient documentation

## 2017-01-14 DIAGNOSIS — Z794 Long term (current) use of insulin: Secondary | ICD-10-CM | POA: Insufficient documentation

## 2017-01-14 DIAGNOSIS — R072 Precordial pain: Secondary | ICD-10-CM | POA: Insufficient documentation

## 2017-01-14 DIAGNOSIS — I5032 Chronic diastolic (congestive) heart failure: Secondary | ICD-10-CM | POA: Diagnosis not present

## 2017-01-14 DIAGNOSIS — E119 Type 2 diabetes mellitus without complications: Secondary | ICD-10-CM | POA: Diagnosis not present

## 2017-01-14 DIAGNOSIS — I482 Chronic atrial fibrillation, unspecified: Secondary | ICD-10-CM

## 2017-01-14 DIAGNOSIS — I130 Hypertensive heart and chronic kidney disease with heart failure and stage 1 through stage 4 chronic kidney disease, or unspecified chronic kidney disease: Secondary | ICD-10-CM | POA: Diagnosis not present

## 2017-01-14 DIAGNOSIS — Z7901 Long term (current) use of anticoagulants: Secondary | ICD-10-CM | POA: Diagnosis not present

## 2017-01-14 DIAGNOSIS — R079 Chest pain, unspecified: Secondary | ICD-10-CM | POA: Diagnosis not present

## 2017-01-14 DIAGNOSIS — N184 Chronic kidney disease, stage 4 (severe): Secondary | ICD-10-CM | POA: Diagnosis not present

## 2017-01-14 DIAGNOSIS — E1122 Type 2 diabetes mellitus with diabetic chronic kidney disease: Secondary | ICD-10-CM | POA: Diagnosis not present

## 2017-01-14 LAB — BASIC METABOLIC PANEL
ANION GAP: 10 (ref 5–15)
BUN: 30 mg/dL — ABNORMAL HIGH (ref 6–20)
CO2: 32 mmol/L (ref 22–32)
Calcium: 9 mg/dL (ref 8.9–10.3)
Chloride: 99 mmol/L — ABNORMAL LOW (ref 101–111)
Creatinine, Ser: 1.68 mg/dL — ABNORMAL HIGH (ref 0.44–1.00)
GFR calc Af Amer: 31 mL/min — ABNORMAL LOW (ref >60)
GFR, EST NON AFRICAN AMERICAN: 26 mL/min — AB (ref >60)
Glucose, Bld: 165 mg/dL — ABNORMAL HIGH (ref 65–99)
Potassium: 4.2 mmol/L (ref 3.5–5.1)
SODIUM: 141 mmol/L (ref 135–145)

## 2017-01-14 LAB — TROPONIN I: Troponin I: <0.03 ng/mL (ref <0.03)

## 2017-01-14 LAB — CBC WITH DIFFERENTIAL/PLATELET
Basophils Absolute: 0 10*3/uL (ref 0.0–0.1)
Basophils Relative: 0 %
EOS ABS: 0.1 10*3/uL (ref 0.0–0.7)
EOS PCT: 2 %
HCT: 37.9 % (ref 36.0–46.0)
Hemoglobin: 11.8 g/dL — ABNORMAL LOW (ref 12.0–15.0)
Lymphocytes Relative: 23 %
Lymphs Abs: 1.2 10*3/uL (ref 0.7–4.0)
MCH: 30.4 pg (ref 26.0–34.0)
MCHC: 31.1 g/dL (ref 30.0–36.0)
MCV: 97.7 fL (ref 78.0–100.0)
MONO ABS: 0.3 10*3/uL (ref 0.1–1.0)
MONOS PCT: 6 %
NEUTROS ABS: 3.4 10*3/uL (ref 1.7–7.7)
Neutrophils Relative %: 68 %
PLATELETS: 82 10*3/uL — AB (ref 150–400)
RBC: 3.88 MIL/uL (ref 3.87–5.11)
RDW: 18.1 % — AB (ref 11.5–15.5)
WBC: 4.9 10*3/uL (ref 4.0–10.5)

## 2017-01-14 MED ORDER — LORAZEPAM 0.5 MG PO TABS: 0.5000 mg | ORAL_TABLET | Freq: Three times a day (TID) | ORAL | 0 refills | Status: DC | PRN

## 2017-01-14 NOTE — ED Notes (Signed)
Pt verbalized understanding of d/c instructions and has no further questions. Pt is stable, A&Ox4, VSS.  

## 2017-01-14 NOTE — ED Provider Notes (Signed)
[facility]-EMER[facility]ENCY DEPT Provider Note   CSN: [id number] Arrival date & time: 01/14/17  0347     History   Chief Complaint Chief Complaint  Patient presents with  . Chest Pain  . Shortness of Breath  LEVEL 5 CAVEAT DUE TO DEMENTIA   HPI Glenda Dickson is a 83 y.o. female.  The history is provided by the patient, a caregiver and a relative.  Chest Pain   The problem occurs constantly. The problem has been resolved. Quality: "gas" The pain does not radiate. Associated symptoms include shortness of breath. Associated symptoms comments: "spitting up" . Risk factors include being elderly.  Shortness of Breath  Associated symptoms include chest pain.   Patient with h/o dementia, h/o chronic afib (on coumadin) presents from [facility] for concerns for chest pain and insomnia Most of the history is provided by daughter  Pt was awake for close to 24 hrs at the facility and during the night she "was patting her chest" but she denied actual CP She was given water to drink and spit it up but no actual vomiting   At this time patient is improved She feels relaxed and wants to take a nap Per daughter, they have recently been adjusting her sleep medication  Past Medical History:  Diagnosis Date  . Anemia, normocytic normochromic 06/26/2012   H&H-11.2/33.7, normal MCV in 06/2012   . Atrial fibrillation (HCC)   . Depression   . Diabetes mellitus type II    no insulin  . Fracture of left hip (HCC) 2006   intertrochanteric; ORIF  . Hyperlipidemia   . Hypertension     Patient Active Problem List   Diagnosis Date Noted  . Pressure injury of skin 09/30/2016  . CAP (community acquired pneumonia) 09/30/2016  . Supratherapeutic INR 09/29/2016  . Chronic diastolic CHF (congestive heart failure) (HCC) 09/29/2016  . Protein calorie malnutrition (HCC) 09/29/2016  . Leg swelling 09/29/2016  . Encounter for therapeutic drug monitoring 11/06/2015  . Chronic kidney disease (CKD), stage  IV (severe) (HCC) 08/24/2012  . Cerebrovascular disease 07/20/2012  . Anemia, normocytic normochromic 06/26/2012  . Hypertension   . Diabetes mellitus type II, non insulin dependent (HCC)   . Hyperlipidemia   . Atrial fibrillation [facility])     Past Surgical History:  Procedure Laterality Date  . ABDOMINAL HYSTERECTOMY    . COLONOSCOPY  2003   Negative screening study.  Drucilla Chalet HIP FRACTURE SURGERY     Fixation    OB History    No data available       Home Medications    Prior to Admission medications   Medication Sig Start Date End Date Taking? Authorizing Provider  acetaminophen (TYLENOL) 325 MG tablet Take 650 mg by mouth every 6 (six) hours as needed for mild pain or moderate pain.    Yes Historical Provider, MD  atorvastatin (LIPITOR) 40 MG tablet Take 40 mg by mouth every evening.    Yes Historical Provider, MD  Calcium Citrate-Vitamin D 315-250 MG-UNIT TABS Take 1 tablet by mouth daily.   Yes Historical Provider, MD  donepezil (ARICEPT) 5 MG tablet Take 5 mg by mouth at bedtime.   Yes Historical Provider, MD  ferrous sulfate 325 (65 FE) MG tablet Take 325 mg by mouth daily with breakfast.  06/29/16  Yes Historical Provider, MD  furosemide (LASIX) 20 MG tablet Take 20-40 mg by mouth daily as needed for fluid.    Yes Historical Provider, MD  furosemide (LASIX) 20 MG  tablet Take 20 mg by mouth daily.   Yes Historical Provider, MD  glipiZIDE (GLUCOTROL XL) 2.5 MG 24 hr tablet Take 2.5 mg by mouth daily with breakfast.    Yes Historical Provider, MD  metoprolol (LOPRESSOR) 50 MG tablet Take 1 tablet (50 mg total) by mouth 2 (two) times daily. 11/27/15  Yes Laqueta Linden, MD  Multiple Vitamin (MULTIVITAMIN) tablet Take 1 tablet by mouth daily.   Yes Historical Provider, MD  nystatin (MYCOSTATIN/NYSTOP) powder Apply topically 3 (three) times daily as needed.   Yes Historical Provider, MD  PARoxetine (PAXIL) 20 MG tablet Take 20 mg by mouth daily. 01/13/17  Yes  Historical Provider, MD  potassium chloride (KLOR-CON) 20 MEQ packet Take 20 mEq by mouth daily.   Yes Historical Provider, MD  ramipril (ALTACE) 5 MG capsule TAKE 1 CAPSULE BY MOUTH DAILY 09/24/16  Yes Laqueta Linden, MD  risperiDONE (RISPERDAL) 1 MG tablet Take 1 mg by mouth at bedtime.   Yes Historical Provider, MD  traZODone (DESYREL) 150 MG tablet Take 150 mg by mouth at bedtime as needed for sleep.   Yes Historical Provider, MD  warfarin (COUMADIN) 2.5 MG tablet TAKE ONE TABLET BY MOUTH ONCE DAILY. 12/03/16  Yes Laqueta Linden, MD  LORazepam (ATIVAN) 0.5 MG tablet Take 1 tablet (0.5 mg total) by mouth every 8 (eight) hours as needed for anxiety. 01/14/17   Zadie Rhine, MD    Family History Family History  Problem Relation Age of Onset  . Hypertension Mother   . Heart failure Mother   . Heart attack Father   . Hypertension Father   . Hypertension Sister     Atrial Fib  . Hypertension Brother     4/6 brothers with htn    Social History Social History  Substance Use Topics  . Smoking status: Never Smoker  . Smokeless tobacco: Never Used  . Alcohol use No     Allergies   Benzalkonium chloride-alcohol and Codeine   Review of Systems Review of Systems  Unable to perform ROS: Dementia  Respiratory: Positive for shortness of breath.   Cardiovascular: Positive for chest pain.     Physical Exam Updated Vital Signs BP (!) 140/98   Pulse 78   Temp 97.4 F (36.3 C) (Oral)   Resp 16   SpO2 97%   Physical Exam CONSTITUTIONAL: Elderly, frail, no acute distress HEAD: Normocephalic/atraumatic EYES: EOMI ENMT: Mucous membranes moist, poor dentition NECK: supple no meningeal signs SPINE/BACK:entire spine nontender CV: irregular, no murmurs/rubs/gallops noted LUNGS: Lungs are clear to auscultation bilaterally, no apparent distress ABDOMEN: soft, nontender  NEURO: Pt is awake/alert, maex4.  She appears mildly confused EXTREMITIES: pulses normal/equal, full  ROM SKIN: warm, color normal   ED Treatments / Results  Labs (all labs ordered are listed, but only abnormal results are displayed) Labs Reviewed  BASIC METABOLIC PANEL - Abnormal; Notable for the following:       Result Value   Chloride 99 (*)    Glucose, Bld 165 (*)    BUN 30 (*)    Creatinine, Ser 1.68 (*)    GFR calc non Af Amer 26 (*)    GFR calc Af Amer 31 (*)    All other components within normal limits  CBC WITH DIFFERENTIAL/PLATELET - Abnormal; Notable for the following:    Hemoglobin 11.8 (*)    RDW 18.1 (*)    Platelets 82 (*)    All other components within normal limits  TROPONIN I  EKG  EKG Interpretation  Date/Time:  Tuesday January 14 2017 03:53:18 EDT Ventricular Rate:  81 PR Interval:    QRS Duration: 74 QT Interval:  394 QTC Calculation: 457 R Axis:   81 Text Interpretation:  Atrial fibrillation with premature ventricular or aberrantly conducted complexes Low voltage QRS Nonspecific T wave abnormality Abnormal ECG Confirmed by Bebe Shaggy  MD, Ziah Leandro ([id number]) on 01/14/2017 4:09:29 AM       Radiology Dg Chest 2 View  Result Date: 01/14/2017 CLINICAL DATA:  83 year old female with chest pain and shortness of breath. EXAM: CHEST  2 VIEW COMPARISON:  Chest radiograph dated 10/23/2016 FINDINGS: There is small left pleural effusion with left lung base opacity consistent with atelectasis versus infiltrate. This is relatively similar to the prior radiograph. The right lung is clear. No pneumothorax. Stable mild cardiomegaly. No acute osseous pathology. IMPRESSION: Persistent small left pleural effusion with left lung base atelectasis versus infiltrate. No interval change. Electronically Signed   By: Elgie Collard M.D.   On: 01/14/2017 05:10    Procedures Procedures (including critical care time)  Medications Ordered in ED Medications - No data to display   Initial Impression / Assessment and Plan / ED Course  I have reviewed the triage vital signs and the  nursing notes.  Pertinent labs & imaging results that were available during my care of the patient were reviewed by me and considered in my medical decision making (see chart for details).     Pt in the ED for two main issues: insomnia and chest pain  For insomnia, daughter reports recent change in meds and now on trazodone and risperdal.  She is requesting additional meds as she has had no sleep in past 24 hrs I spoke to her nurse at the family home [facility]) and she confirmed story of patient "patting her chest" and spitting up fluid.  She reports pt has had significant insomnia After long discussion, I agree to start short/limited course of ativan, but should focus on improved sleep habits and other medications Daughter/patient agreeable  As for ?chest pain/chest patting I offered admission/monitoring as I can not completely rule out ACS or other catastrophic illness After long discussion, pt prefers discharge and daughter is comfortable with this plan  Overall, labs/imaging/EKG at baseline   Final Clinical Impressions(s) / ED Diagnoses   Final diagnoses:  Precordial pain  Primary insomnia  Chronic atrial fibrillation [facility])    New Prescriptions Discharge Medication List as of 01/14/2017  6:32 AM    START taking these medications   Details  LORazepam (ATIVAN) 0.5 MG tablet Take 1 tablet (0.5 mg total) by mouth every 8 (eight) hours as needed for anxiety., Starting Tue 01/14/2017, Print         Zadie Rhine, MD 01/14/17 [phone]

## 2017-01-14 NOTE — ED Triage Notes (Signed)
Patient arrives from [facility]. Currently complaining of shortness of breath and chest discomfort. Onset last night. Family with patient also stated that patient had difficulty swallowing some liquid yesterday as well. Patient unable to speak in full sentences in triage.

## 2017-01-14 NOTE — Discharge Instructions (Signed)

## 2017-01-16 ENCOUNTER — Ambulatory Visit (INDEPENDENT_AMBULATORY_CARE_PROVIDER_SITE_OTHER): Payer: Medicare Other | Admitting: Cardiovascular Disease

## 2017-01-16 ENCOUNTER — Encounter: Payer: Self-pay | Admitting: Cardiovascular Disease

## 2017-01-16 ENCOUNTER — Encounter: Payer: Self-pay | Admitting: *Deleted

## 2017-01-16 VITALS — BP 140/80 | HR 70 | Ht 67.0 in | Wt 194.0 lb

## 2017-01-16 DIAGNOSIS — Z9289 Personal history of other medical treatment: Secondary | ICD-10-CM

## 2017-01-16 DIAGNOSIS — I10 Essential (primary) hypertension: Secondary | ICD-10-CM

## 2017-01-16 DIAGNOSIS — I482 Chronic atrial fibrillation: Secondary | ICD-10-CM | POA: Diagnosis not present

## 2017-01-16 DIAGNOSIS — I4821 Permanent atrial fibrillation: Secondary | ICD-10-CM

## 2017-01-16 DIAGNOSIS — R0602 Shortness of breath: Secondary | ICD-10-CM | POA: Diagnosis not present

## 2017-01-16 DIAGNOSIS — R079 Chest pain, unspecified: Secondary | ICD-10-CM

## 2017-01-16 DIAGNOSIS — I5032 Chronic diastolic (congestive) heart failure: Secondary | ICD-10-CM | POA: Diagnosis not present

## 2017-01-16 DIAGNOSIS — E7800 Pure hypercholesterolemia, unspecified: Secondary | ICD-10-CM | POA: Diagnosis not present

## 2017-01-16 MED ORDER — RAMIPRIL 10 MG PO CAPS: 10.0000 mg | ORAL_CAPSULE | Freq: Every day | ORAL | 6 refills | Status: DC

## 2017-01-16 MED ORDER — RAMIPRIL 10 MG PO CAPS: 10.0000 mg | ORAL_CAPSULE | Freq: Every day | ORAL | 3 refills | Status: DC

## 2017-01-16 NOTE — Addendum Note (Signed)
Addended by: Lesle Chris on: 01/16/2017 03:21 PM   Modules accepted: Orders

## 2017-01-16 NOTE — Patient Instructions (Signed)
Medication Instructions:   Increase Ramipril to  daily.   Continue all other medications.    Labwork: none  Testing/Procedures:  Your physician has requested that you have a lexiscan myoview. For further information please visit [email or web address] Please follow instruction sheet, as given.  Office will contact with results via phone or letter.    Follow-Up: 3 months   Any Other Special Instructions Will Be Listed Below (If Applicable).  If you need a refill on your cardiac medications before your next appointment, please call your pharmacy.

## 2017-01-16 NOTE — Progress Notes (Signed)
SUBJECTIVE: The patient presents for follow-up of atrial fibrillation and chronic diastolic heart failure.  Echocardiogram 10/01/16: Normal left ventricular systolic function, LVEF 50-55%, severe left atrial dilatation, moderate right atrial dilatation, mildly reduced right ventricular systolic function, moderate to severe tricuspid regurgitation. PA pressures 49 mmHg.  She was evaluated for chest pain in the ED on 01/14/17. I personally reviewed all documentation, labs, and studies.  Blood pressure was 140/98, pulse 78.  Troponin was normal. BUN 30, creatinine 1.68, hemoglobin 11.8.  Chest x-ray showed a persistent small left pleural effusion with left lung base atelectasis versus infiltrate.  ECG which I personally interpreted demonstrated atrial fibrillation with PVCs, 81 bpm.  Of note, she underwent a low risk nuclear stress test without myocardial ischemia or scar on 06/23/12.  She is here with her caregiver. Oxygen saturations are 92%. She has apparently been complaining of "suffocating "at nights. She uses 2 L of oxygen. She is very drowsy at present and is on Xanax. Her caregiver says that the patient sees "dead people ".  The patient denies chest pain.  Urinalysis was reportedly clear 5 days ago.  Blood pressures at her facility have measured 158/135, 122/107, 132/101, and 115/101.   Review of Systems: As per "subjective", otherwise negative.  Allergies  Allergen Reactions  . Benzalkonium Chloride-Alcohol Other (See Comments)    Patient/family is not familiar with this allergy  . Codeine Itching and Nausea Only    Current Outpatient Prescriptions  Medication Sig Dispense Refill  . acetaminophen (TYLENOL) 325 MG tablet Take 650 mg by mouth every 6 (six) hours as needed for mild pain or moderate pain.     Marland Kitchen atorvastatin (LIPITOR) 40 MG tablet Take 40 mg by mouth every evening.     . Calcium Citrate-Vitamin D 315-250 MG-UNIT TABS Take 1 tablet by mouth daily.    Marland Kitchen  donepezil (ARICEPT) 5 MG tablet Take 5 mg by mouth at bedtime.    . ferrous sulfate 325 (65 FE) MG tablet Take 325 mg by mouth daily with breakfast.   5  . furosemide (LASIX) 20 MG tablet Take 20 mg by mouth daily.    Marland Kitchen glipiZIDE (GLUCOTROL XL) 2.5 MG 24 hr tablet Take 2.5 mg by mouth daily with breakfast.     . LORazepam (ATIVAN) 0.5 MG tablet Take 1 tablet (0.5 mg total) by mouth every 8 (eight) hours as needed for anxiety. 10 tablet 0  . metoprolol (LOPRESSOR) 50 MG tablet Take 1 tablet (50 mg total) by mouth 2 (two) times daily. 180 tablet 3  . Multiple Vitamin (MULTIVITAMIN) tablet Take 1 tablet by mouth daily.    Marland Kitchen nystatin (MYCOSTATIN/NYSTOP) powder Apply topically 3 (three) times daily as needed.    Marland Kitchen PARoxetine (PAXIL) 20 MG tablet Take 20 mg by mouth daily.    . potassium chloride (KLOR-CON) 20 MEQ packet Take 20 mEq by mouth daily.    . ramipril (ALTACE) 5 MG capsule TAKE 1 CAPSULE BY MOUTH DAILY 90 capsule 0  . risperiDONE (RISPERDAL) 1 MG tablet Take 1 mg by mouth at bedtime.    . traZODone (DESYREL) 150 MG tablet Take 150 mg by mouth at bedtime as needed for sleep.    Marland Kitchen warfarin (COUMADIN) 2.5 MG tablet TAKE ONE TABLET BY MOUTH ONCE DAILY. 30 tablet 6   No current facility-administered medications for this visit.     Past Medical History:  Diagnosis Date  . Anemia, normocytic normochromic 06/26/2012   H&H-11.2/33.7, normal MCV in  06/2012   . Atrial fibrillation (HCC)   . Depression   . Diabetes mellitus type II    no insulin  . Fracture of left hip (HCC) 2006   intertrochanteric; ORIF  . Hyperlipidemia   . Hypertension     Past Surgical History:  Procedure Laterality Date  . ABDOMINAL HYSTERECTOMY    . COLONOSCOPY  2003   Negative screening study.  Drucilla Chalet HIP FRACTURE SURGERY     Fixation    Social History   Social History  . Marital status: Widowed    Spouse name: N/A  . Number of children: N/A  . Years of education: N/A   Occupational  History  . Not on file.   Social History Main Topics  . Smoking status: Never Smoker  . Smokeless tobacco: Never Used  . Alcohol use No  . Drug use: No  . Sexual activity: Not Currently   Other Topics Concern  . Not on file   Social History Narrative  . No narrative on file     Vitals:   01/16/17 1415  BP: 140/80  Pulse: 70  SpO2: 92%  Weight: 194 lb (88 kg)  Height:  (1.702 m)    Wt Readings from Last 3 Encounters:  01/16/17 194 lb (88 kg)  10/23/16 180 lb (81.6 kg)  10/02/16 180 lb 12.8 oz (82 kg)     PHYSICAL EXAM General: NAD, markedly drowsy but responsive. HEENT: Normal. Neck: No JVD, no thyromegaly. Lungs: Clear to auscultation bilaterally with normal respiratory effort. CV: Nondisplaced PMI.  Regular rate and irregular rhythm, normal S1/S2, no S3, no murmur. Trace pretibial b/l edema.  Abdomen: Soft, nontender, no distention.  Neurologic: Markedly drowsy but responsive  Skin: Normal. Musculoskeletal: No gross deformities.    ECG: Most recent ECG reviewed.   Labs: Lab Results  Component Value Date/Time   K 4.2 01/14/2017 04:36 AM   BUN 30 (H) 01/14/2017 04:36 AM   CREATININE 1.68 (H) 01/14/2017 04:36 AM   CREATININE 1.39 (H) 10/01/2012 10:10 AM   ALT 23 10/23/2016 02:43 PM   TSH 5.046 (H) 06/25/2012 08:56 AM   HGB 11.8 (L) 01/14/2017 04:36 AM     Lipids: Lab Results  Component Value Date/Time   LDLCALC 52 10/23/2015 07:40 PM   CHOL 101 10/23/2015 07:40 PM   TRIG 96 10/23/2015 07:40 PM   HDL 30 (L) 10/23/2015 07:40 PM       ASSESSMENT AND PLAN:  1. Permanent atrial fibrillation: Symptomatically stable on metoprolol tartrate 50 mg twice daily. CHADSVASC score is 6 indicating a high thromboembolic risk. Given her CKD, I will continue warfarin.  2. Chronic diastolic heart failure/bilateral leg edema: Euvolemic on Lasix 20 mg daily. She has been instructed to take an extra 20 mg as needed.  3. Essential HTN: Markedly elevated at  home. I will increase ramipril to 10 mg daily.  4. Hyperlipidemia: Continue statin.  5. Chest pain and shortness of breath: I will proceed with a nuclear myocardial perfusion imaging study to evaluate for ischemic heart disease (Lexiscan Myoview).  Time spent: 40 minutes, of which greater than 50% was spent reviewing symptoms, relevant blood tests and studies, and discussing management plan with the patient.    Disposition: Follow up 3 months.   Prentice Docker, M.D., F.A.C.C.

## 2017-01-17 DIAGNOSIS — R410 Disorientation, unspecified: Secondary | ICD-10-CM | POA: Diagnosis not present

## 2017-01-17 DIAGNOSIS — F0390 Unspecified dementia without behavioral disturbance: Secondary | ICD-10-CM | POA: Diagnosis not present

## 2017-01-24 ENCOUNTER — Encounter (HOSPITAL_COMMUNITY)
Admission: RE | Admit: 2017-01-24 | Discharge: 2017-01-24 | Disposition: A | Discharge status: Home / Self Care | Payer: Medicare Other | Source: Ambulatory Visit | Attending: Cardiovascular Disease | Admitting: Cardiovascular Disease

## 2017-01-24 ENCOUNTER — Encounter (HOSPITAL_BASED_OUTPATIENT_CLINIC_OR_DEPARTMENT_OTHER)
Admission: RE | Admit: 2017-01-24 | Discharge: 2017-01-24 | Disposition: A | Discharge status: Home / Self Care | Payer: Medicare Other | Source: Ambulatory Visit | Attending: Cardiovascular Disease | Admitting: Cardiovascular Disease

## 2017-01-24 ENCOUNTER — Encounter (HOSPITAL_COMMUNITY): Payer: Self-pay

## 2017-01-24 DIAGNOSIS — R0602 Shortness of breath: Secondary | ICD-10-CM | POA: Diagnosis not present

## 2017-01-24 LAB — NM MYOCAR MULTI W/SPECT W/WALL MOTION / EF
CHL CUP NUCLEAR SDS: 4
CHL CUP RESTING HR STRESS: 67 {beats}/min
CSEPPHR: 97 {beats}/min
LHR: 0.57
LVDIAVOL: 53 mL (ref 46–106)
LVSYSVOL: 46 mL
SRS: 0
SSS: 4
TID: 1.08

## 2017-01-24 MED ORDER — TECHNETIUM TC 99M TETROFOSMIN IV KIT
30.0000 | PACK | Freq: Once | INTRAVENOUS | Status: AC | PRN
  Administered 2017-01-24: 30 via INTRAVENOUS

## 2017-01-24 MED ORDER — TECHNETIUM TC 99M TETROFOSMIN IV KIT
10.0000 | PACK | Freq: Once | INTRAVENOUS | Status: AC | PRN
  Administered 2017-01-24: 10.7 via INTRAVENOUS

## 2017-01-24 MED ORDER — REGADENOSON 0.4 MG/5ML IV SOLN
INTRAVENOUS | Status: AC
  Administered 2017-01-24: 0.4 mg via INTRAVENOUS
  Filled 2017-01-24: qty 5

## 2017-01-24 MED ORDER — SODIUM CHLORIDE 0.9% FLUSH
INTRAVENOUS | Status: AC
  Administered 2017-01-24: 10 mL via INTRAVENOUS
  Filled 2017-01-24: qty 10

## 2017-01-27 ENCOUNTER — Telehealth: Payer: Self-pay

## 2017-01-27 DIAGNOSIS — I519 Heart disease, unspecified: Secondary | ICD-10-CM

## 2017-01-27 NOTE — Telephone Encounter (Signed)
Spoke with caregiver,she will arrange limited echocardiogram

## 2017-01-27 NOTE — Telephone Encounter (Signed)
-----   Message from Laqueta LindenSuresh A Koneswaran, MD sent at 01/26/2017 12:14 AM EDT ----- Please repeat a LIMITED echo to assess LV systolic function. No evidence for blockages.

## 2017-01-28 ENCOUNTER — Other Ambulatory Visit: Payer: Self-pay | Admitting: Licensed Clinical Social Worker

## 2017-01-28 NOTE — Patient Outreach (Signed)
Assessment:  CSW spoke via phone with client. CSW verified client identity. CSW and client spoke of client needs. Client has been receiving care at [facility] recently. She had been receiving physical therapy at [facility] as scheduled. Client recently completed physical therapy sessions for client at [facility]. Client has been receiving medications as prescribed.  CSW and client spoke of client care plan. CSW encouraged client to communicate with CSW in next 30 days to discuss community resources of assistance for client.Client uses Ran to ambulate. She is at risk for falls. Client is eating adequately. She is sleeping adequately.  Client has support from staff at [facility]. Client has family support from her son and from her daughter.  Client sees Dr. Margo AyeHall as primary care doctor.  Client is attending medical appointments for client as scheduled. Client does not mention any pain issues at present.  Client needs some supervision with activities of daily living. CSW has also spoken with Hedda SladeLeslie [facility], owner of [facility], regarding client needs. [facility] Au[facility]lie said that client is sometimes slightly forgetful. [facility] AuLeslie said that family plans to talk with Dr. Margo AyeHall at next client appointment with Dr. Margo AyeHall about client memory and forgetfulness issues. CSW thanked client for phone call with CSW on 01/28/17.    Plan:  Client to communicate with CSW in next 30 days to discuss community resources of assistance for client.  CSW to call client or family representative in 4 weeks to assess client needs at that time.  Kelton PillarMichael S.Jamond Neels MSW, LCSW Licensed Clinical Social Worker [facility] Care [facility] [phone]

## 2017-01-29 ENCOUNTER — Ambulatory Visit (HOSPITAL_COMMUNITY)
Admission: RE | Admit: 2017-01-29 | Discharge: 2017-01-29 | Disposition: A | Discharge status: Home / Self Care | Payer: Medicare Other | Source: Ambulatory Visit | Attending: Cardiovascular Disease | Admitting: Cardiovascular Disease

## 2017-01-29 ENCOUNTER — Ambulatory Visit (INDEPENDENT_AMBULATORY_CARE_PROVIDER_SITE_OTHER): Payer: Medicare Other | Admitting: *Deleted

## 2017-01-29 ENCOUNTER — Telehealth: Payer: Self-pay

## 2017-01-29 DIAGNOSIS — I371 Nonrheumatic pulmonary valve insufficiency: Secondary | ICD-10-CM | POA: Diagnosis not present

## 2017-01-29 DIAGNOSIS — Z5181 Encounter for therapeutic drug level monitoring: Secondary | ICD-10-CM

## 2017-01-29 DIAGNOSIS — I509 Heart failure, unspecified: Secondary | ICD-10-CM | POA: Diagnosis not present

## 2017-01-29 DIAGNOSIS — I4891 Unspecified atrial fibrillation: Secondary | ICD-10-CM | POA: Diagnosis not present

## 2017-01-29 DIAGNOSIS — I519 Heart disease, unspecified: Secondary | ICD-10-CM

## 2017-01-29 DIAGNOSIS — I071 Rheumatic tricuspid insufficiency: Secondary | ICD-10-CM | POA: Insufficient documentation

## 2017-01-29 DIAGNOSIS — I110 Hypertensive heart disease with heart failure: Secondary | ICD-10-CM | POA: Insufficient documentation

## 2017-01-29 DIAGNOSIS — Q211 Atrial septal defect: Secondary | ICD-10-CM | POA: Insufficient documentation

## 2017-01-29 DIAGNOSIS — E119 Type 2 diabetes mellitus without complications: Secondary | ICD-10-CM | POA: Insufficient documentation

## 2017-01-29 DIAGNOSIS — E785 Hyperlipidemia, unspecified: Secondary | ICD-10-CM | POA: Insufficient documentation

## 2017-01-29 DIAGNOSIS — I313 Pericardial effusion (noninflammatory): Secondary | ICD-10-CM | POA: Diagnosis not present

## 2017-01-29 LAB — ECHOCARDIOGRAM LIMITED
AVLVOTPG: 2 mmHg
CHL CUP DOP CALC LVOT VTI: 13.1 cm
CHL CUP RV SYS PRESS: 50 mmHg
CHL CUP TV REG PEAK VELOCITY: 297 cm/s
E decel time: 250 msec
FS: 18 % — AB (ref 28–44)
IVS/LV PW RATIO, ED: 0.79
LA diam end sys: 45 mm
LA vol: 97.5 mL
LADIAMINDEX: 2.18 cm/m2
LASIZE: 45 mm
LAVOLA4C: 79.1 mL
LAVOLIN: 47.2 mL/m2
LV PW d: 11.4 mm — AB (ref 0.6–1.1)
LV dias vol index: 23 mL/m2
LV sys vol: 31 mL (ref 14–42)
LVDIAVOL: 48 mL (ref 46–106)
LVOT area: 1.77 cm2
LVOT diameter: 15 mm
LVOTPV: 67.2 cm/s
LVOTSV: 23 mL
LVSYSVOLIN: 15 mL/m2
MV Dec: 250
MV Peak grad: 7 mmHg
MVPKEVEL: 129 m/s
Simpson's disk: 36
Stroke v: 17 ml
TR max vel: 297 cm/s

## 2017-01-29 LAB — POCT INR: INR: 1.8

## 2017-01-29 NOTE — Progress Notes (Signed)
*  PRELIMINARY RESULTS* Echocardiogram 2D Echocardiogram has been performed.  Glenda Dickson, Glenda Dickson 01/29/2017, 2:47 PM

## 2017-01-29 NOTE — Telephone Encounter (Signed)
-----   Message from Suresh A Koneswaran, MD sent at 01/29/2017  3:43 PM EDT ----- LV function has significantly declined. Switch metoprolol tartrate to succinate 50 mg BID. Make sure BP is checked routinely at facility. I increased ramipril at last visit. Creatinine is elevated so I am not inclined to have her undergo a cath. Also, no findings on stress testing to suggest new blockages. 

## 2017-01-29 NOTE — Telephone Encounter (Signed)
Called pt. No answer. Will call back.

## 2017-01-30 ENCOUNTER — Telehealth: Payer: Self-pay

## 2017-01-30 MED ORDER — METOPROLOL SUCCINATE ER 50 MG PO TB24: 50.0000 mg | ORAL_TABLET | Freq: Two times a day (BID) | ORAL | 3 refills | Status: DC

## 2017-01-30 NOTE — Telephone Encounter (Signed)
-----   Message from Laqueta LindenSuresh A Koneswaran, MD sent at 01/29/2017  3:43 PM EDT ----- LV function has significantly declined. Switch metoprolol tartrate to succinate 50 mg BID. Make sure BP is checked routinely at facility. I increased ramipril at last visit. Creatinine is elevated so I am not inclined to have her undergo a cath. Also, no findings on stress testing to suggest new blockages.

## 2017-01-30 NOTE — Telephone Encounter (Signed)
I  Spoke with caretaker and will fax her order for 4 x a week  BP checks fax  [phone]

## 2017-02-03 DIAGNOSIS — E1122 Type 2 diabetes mellitus with diabetic chronic kidney disease: Secondary | ICD-10-CM | POA: Diagnosis not present

## 2017-02-03 DIAGNOSIS — D509 Iron deficiency anemia, unspecified: Secondary | ICD-10-CM | POA: Diagnosis not present

## 2017-02-05 DIAGNOSIS — D638 Anemia in other chronic diseases classified elsewhere: Secondary | ICD-10-CM | POA: Diagnosis not present

## 2017-02-05 DIAGNOSIS — I10 Essential (primary) hypertension: Secondary | ICD-10-CM | POA: Diagnosis not present

## 2017-02-05 DIAGNOSIS — E782 Mixed hyperlipidemia: Secondary | ICD-10-CM | POA: Diagnosis not present

## 2017-02-05 DIAGNOSIS — E46 Unspecified protein-calorie malnutrition: Secondary | ICD-10-CM | POA: Diagnosis not present

## 2017-02-05 DIAGNOSIS — N184 Chronic kidney disease, stage 4 (severe): Secondary | ICD-10-CM | POA: Diagnosis not present

## 2017-02-05 DIAGNOSIS — I482 Chronic atrial fibrillation: Secondary | ICD-10-CM | POA: Diagnosis not present

## 2017-02-05 DIAGNOSIS — Z0000 Encounter for general adult medical examination without abnormal findings: Secondary | ICD-10-CM | POA: Diagnosis not present

## 2017-02-05 DIAGNOSIS — E1122 Type 2 diabetes mellitus with diabetic chronic kidney disease: Secondary | ICD-10-CM | POA: Diagnosis not present

## 2017-02-19 ENCOUNTER — Ambulatory Visit (INDEPENDENT_AMBULATORY_CARE_PROVIDER_SITE_OTHER): Payer: Medicare Other | Admitting: *Deleted

## 2017-02-19 DIAGNOSIS — Z5181 Encounter for therapeutic drug level monitoring: Secondary | ICD-10-CM

## 2017-02-19 DIAGNOSIS — I4891 Unspecified atrial fibrillation: Secondary | ICD-10-CM | POA: Diagnosis not present

## 2017-02-19 LAB — POCT INR: INR: 1.5

## 2017-02-19 MED ORDER — WARFARIN SODIUM 2.5 MG PO TABS: ORAL_TABLET | ORAL | 6 refills | Status: DC

## 2017-03-03 ENCOUNTER — Other Ambulatory Visit: Payer: Self-pay | Admitting: Licensed Clinical Social Worker

## 2017-03-03 ENCOUNTER — Ambulatory Visit (INDEPENDENT_AMBULATORY_CARE_PROVIDER_SITE_OTHER): Payer: Medicare Other | Admitting: *Deleted

## 2017-03-03 DIAGNOSIS — I4891 Unspecified atrial fibrillation: Secondary | ICD-10-CM

## 2017-03-03 DIAGNOSIS — Z5181 Encounter for therapeutic drug level monitoring: Secondary | ICD-10-CM

## 2017-03-03 LAB — POCT INR: INR: 1.9

## 2017-03-03 MED ORDER — WARFARIN SODIUM 2.5 MG PO TABS: ORAL_TABLET | ORAL | 6 refills | Status: DC

## 2017-03-03 NOTE — Patient Outreach (Signed)
Assessment:  CSW spoke via phone with client. CSW verified client identity. CSW received verbal permission from client on 03/03/17 for CSW to communicate with client about current client needs and status. Client sees Dr. Margo AyeHall as primary care doctor. Client said she had her prescribed medications and is taking medications as prescribed. Client has been receiving care at [facility]. CSW and client spoke of client care plan. CSW encouraged client to communicate with CSW in next 30 days to discuss community resources of assistance for client.  Client uses a [place] to assist her in ambulation. She is at risk for falls. Client is eating adequately and sleeping adequately. She said she has support from staff at [facility]. Client also has family support from her son and from her daughter. Client has regular visits from her son and from her daughter. Client did not mention any pain issues for client at present. Client continues to needs supervision with activities of daily living for client. Client is attending medical appointments for client as scheduled. Client can communicate her needs to care providers. CSW has encouraged client to communicate with CSW at [phone] as needed to discuss social work needs of client.    Plan:  Client to communicate with CSW in next 30 days to discuss community resources of assistance for client.  CSW to call client in 4 weeks to assess client needs at that time.  Kelton PillarMichael S.Mathea Frieling MSW, LCSW Licensed Clinical Social Worker [facility] Care Management [phone]

## 2017-03-10 ENCOUNTER — Emergency Department (HOSPITAL_COMMUNITY): Payer: Medicare Other

## 2017-03-10 ENCOUNTER — Other Ambulatory Visit: Payer: Self-pay | Admitting: Internal Medicine

## 2017-03-10 ENCOUNTER — Encounter (HOSPITAL_COMMUNITY): Payer: Self-pay | Admitting: Emergency Medicine

## 2017-03-10 ENCOUNTER — Inpatient Hospital Stay (HOSPITAL_COMMUNITY)
Admission: EM | Admit: 2017-03-10 | Discharge: 2017-03-21 | DRG: 080 | Disposition: A | Discharge status: Hospice | Payer: Medicare Other | Attending: Family Medicine | Admitting: Family Medicine

## 2017-03-10 DIAGNOSIS — E038 Other specified hypothyroidism: Secondary | ICD-10-CM | POA: Diagnosis not present

## 2017-03-10 DIAGNOSIS — R682 Dry mouth, unspecified: Secondary | ICD-10-CM | POA: Diagnosis not present

## 2017-03-10 DIAGNOSIS — Z6834 Body mass index (BMI) 34.0-34.9, adult: Secondary | ICD-10-CM | POA: Diagnosis not present

## 2017-03-10 DIAGNOSIS — T148XXA Other injury of unspecified body region, initial encounter: Secondary | ICD-10-CM

## 2017-03-10 DIAGNOSIS — K7460 Unspecified cirrhosis of liver: Secondary | ICD-10-CM | POA: Diagnosis present

## 2017-03-10 DIAGNOSIS — Z7901 Long term (current) use of anticoagulants: Secondary | ICD-10-CM

## 2017-03-10 DIAGNOSIS — R4182 Altered mental status, unspecified: Secondary | ICD-10-CM | POA: Diagnosis not present

## 2017-03-10 DIAGNOSIS — Z885 Allergy status to narcotic agent status: Secondary | ICD-10-CM

## 2017-03-10 DIAGNOSIS — S72455A Nondisplaced supracondylar fracture without intracondylar extension of lower end of left femur, initial encounter for closed fracture: Secondary | ICD-10-CM | POA: Diagnosis not present

## 2017-03-10 DIAGNOSIS — I639 Cerebral infarction, unspecified: Secondary | ICD-10-CM | POA: Diagnosis present

## 2017-03-10 DIAGNOSIS — I509 Heart failure, unspecified: Secondary | ICD-10-CM | POA: Diagnosis present

## 2017-03-10 DIAGNOSIS — N189 Chronic kidney disease, unspecified: Secondary | ICD-10-CM | POA: Diagnosis not present

## 2017-03-10 DIAGNOSIS — I130 Hypertensive heart and chronic kidney disease with heart failure and stage 1 through stage 4 chronic kidney disease, or unspecified chronic kidney disease: Secondary | ICD-10-CM | POA: Diagnosis present

## 2017-03-10 DIAGNOSIS — W1830XA Fall on same level, unspecified, initial encounter: Secondary | ICD-10-CM | POA: Diagnosis not present

## 2017-03-10 DIAGNOSIS — I5042 Chronic combined systolic (congestive) and diastolic (congestive) heart failure: Secondary | ICD-10-CM | POA: Diagnosis present

## 2017-03-10 DIAGNOSIS — J9601 Acute respiratory failure with hypoxia: Secondary | ICD-10-CM

## 2017-03-10 DIAGNOSIS — Z66 Do not resuscitate: Secondary | ICD-10-CM | POA: Diagnosis present

## 2017-03-10 DIAGNOSIS — E1122 Type 2 diabetes mellitus with diabetic chronic kidney disease: Secondary | ICD-10-CM | POA: Diagnosis present

## 2017-03-10 DIAGNOSIS — B372 Candidiasis of skin and nail: Secondary | ICD-10-CM | POA: Diagnosis not present

## 2017-03-10 DIAGNOSIS — E119 Type 2 diabetes mellitus without complications: Secondary | ICD-10-CM | POA: Diagnosis not present

## 2017-03-10 DIAGNOSIS — Z452 Encounter for adjustment and management of vascular access device: Secondary | ICD-10-CM | POA: Diagnosis not present

## 2017-03-10 DIAGNOSIS — F0150 Vascular dementia without behavioral disturbance: Secondary | ICD-10-CM | POA: Diagnosis not present

## 2017-03-10 DIAGNOSIS — R7989 Other specified abnormal findings of blood chemistry: Secondary | ICD-10-CM | POA: Diagnosis not present

## 2017-03-10 DIAGNOSIS — Z888 Allergy status to other drugs, medicaments and biological substances status: Secondary | ICD-10-CM

## 2017-03-10 DIAGNOSIS — G9340 Encephalopathy, unspecified: Secondary | ICD-10-CM | POA: Diagnosis present

## 2017-03-10 DIAGNOSIS — K7581 Nonalcoholic steatohepatitis (NASH): Secondary | ICD-10-CM | POA: Diagnosis present

## 2017-03-10 DIAGNOSIS — D649 Anemia, unspecified: Secondary | ICD-10-CM | POA: Diagnosis not present

## 2017-03-10 DIAGNOSIS — F329 Major depressive disorder, single episode, unspecified: Secondary | ICD-10-CM | POA: Diagnosis present

## 2017-03-10 DIAGNOSIS — I9589 Other hypotension: Secondary | ICD-10-CM | POA: Diagnosis not present

## 2017-03-10 DIAGNOSIS — G9341 Metabolic encephalopathy: Secondary | ICD-10-CM | POA: Diagnosis not present

## 2017-03-10 DIAGNOSIS — I959 Hypotension, unspecified: Secondary | ICD-10-CM | POA: Diagnosis not present

## 2017-03-10 DIAGNOSIS — K7469 Other cirrhosis of liver: Secondary | ICD-10-CM | POA: Diagnosis not present

## 2017-03-10 DIAGNOSIS — I482 Chronic atrial fibrillation: Secondary | ICD-10-CM | POA: Diagnosis not present

## 2017-03-10 DIAGNOSIS — I4891 Unspecified atrial fibrillation: Secondary | ICD-10-CM | POA: Diagnosis present

## 2017-03-10 DIAGNOSIS — I5022 Chronic systolic (congestive) heart failure: Secondary | ICD-10-CM

## 2017-03-10 DIAGNOSIS — E669 Obesity, unspecified: Secondary | ICD-10-CM | POA: Diagnosis present

## 2017-03-10 DIAGNOSIS — R0602 Shortness of breath: Secondary | ICD-10-CM

## 2017-03-10 DIAGNOSIS — Z8673 Personal history of transient ischemic attack (TIA), and cerebral infarction without residual deficits: Secondary | ICD-10-CM

## 2017-03-10 DIAGNOSIS — R17 Unspecified jaundice: Secondary | ICD-10-CM | POA: Diagnosis not present

## 2017-03-10 DIAGNOSIS — D631 Anemia in chronic kidney disease: Secondary | ICD-10-CM | POA: Diagnosis not present

## 2017-03-10 DIAGNOSIS — E035 Myxedema coma: Principal | ICD-10-CM

## 2017-03-10 DIAGNOSIS — S72002A Fracture of unspecified part of neck of left femur, initial encounter for closed fracture: Secondary | ICD-10-CM | POA: Diagnosis present

## 2017-03-10 DIAGNOSIS — F419 Anxiety disorder, unspecified: Secondary | ICD-10-CM | POA: Diagnosis not present

## 2017-03-10 DIAGNOSIS — K760 Fatty (change of) liver, not elsewhere classified: Secondary | ICD-10-CM | POA: Diagnosis not present

## 2017-03-10 DIAGNOSIS — E1151 Type 2 diabetes mellitus with diabetic peripheral angiopathy without gangrene: Secondary | ICD-10-CM | POA: Diagnosis present

## 2017-03-10 DIAGNOSIS — E875 Hyperkalemia: Secondary | ICD-10-CM | POA: Diagnosis present

## 2017-03-10 DIAGNOSIS — R531 Weakness: Secondary | ICD-10-CM | POA: Diagnosis not present

## 2017-03-10 DIAGNOSIS — N184 Chronic kidney disease, stage 4 (severe): Secondary | ICD-10-CM | POA: Diagnosis not present

## 2017-03-10 DIAGNOSIS — E1121 Type 2 diabetes mellitus with diabetic nephropathy: Secondary | ICD-10-CM | POA: Diagnosis not present

## 2017-03-10 DIAGNOSIS — R296 Repeated falls: Secondary | ICD-10-CM | POA: Diagnosis present

## 2017-03-10 DIAGNOSIS — E039 Hypothyroidism, unspecified: Secondary | ICD-10-CM | POA: Diagnosis present

## 2017-03-10 DIAGNOSIS — G464 Cerebellar stroke syndrome: Secondary | ICD-10-CM | POA: Diagnosis not present

## 2017-03-10 DIAGNOSIS — E872 Acidosis, unspecified: Secondary | ICD-10-CM

## 2017-03-10 DIAGNOSIS — S72402A Unspecified fracture of lower end of left femur, initial encounter for closed fracture: Secondary | ICD-10-CM | POA: Diagnosis not present

## 2017-03-10 DIAGNOSIS — E876 Hypokalemia: Secondary | ICD-10-CM | POA: Diagnosis present

## 2017-03-10 DIAGNOSIS — Z23 Encounter for immunization: Secondary | ICD-10-CM | POA: Diagnosis not present

## 2017-03-10 DIAGNOSIS — S7292XA Unspecified fracture of left femur, initial encounter for closed fracture: Secondary | ICD-10-CM | POA: Diagnosis not present

## 2017-03-10 DIAGNOSIS — S8991XA Unspecified injury of right lower leg, initial encounter: Secondary | ICD-10-CM | POA: Diagnosis not present

## 2017-03-10 DIAGNOSIS — R0603 Acute respiratory distress: Secondary | ICD-10-CM | POA: Diagnosis not present

## 2017-03-10 DIAGNOSIS — S72499A Other fracture of lower end of unspecified femur, initial encounter for closed fracture: Secondary | ICD-10-CM | POA: Diagnosis present

## 2017-03-10 DIAGNOSIS — R51 Headache: Secondary | ICD-10-CM | POA: Diagnosis not present

## 2017-03-10 DIAGNOSIS — N170 Acute kidney failure with tubular necrosis: Secondary | ICD-10-CM | POA: Diagnosis present

## 2017-03-10 DIAGNOSIS — M25562 Pain in left knee: Secondary | ICD-10-CM

## 2017-03-10 DIAGNOSIS — M25552 Pain in left hip: Secondary | ICD-10-CM | POA: Diagnosis not present

## 2017-03-10 DIAGNOSIS — S72452A Displaced supracondylar fracture without intracondylar extension of lower end of left femur, initial encounter for closed fracture: Secondary | ICD-10-CM | POA: Diagnosis present

## 2017-03-10 DIAGNOSIS — I10 Essential (primary) hypertension: Secondary | ICD-10-CM | POA: Diagnosis present

## 2017-03-10 DIAGNOSIS — M25519 Pain in unspecified shoulder: Secondary | ICD-10-CM | POA: Diagnosis present

## 2017-03-10 DIAGNOSIS — Z993 Dependence on wheelchair: Secondary | ICD-10-CM

## 2017-03-10 DIAGNOSIS — S72492A Other fracture of lower end of left femur, initial encounter for closed fracture: Secondary | ICD-10-CM | POA: Diagnosis not present

## 2017-03-10 DIAGNOSIS — I6350 Cerebral infarction due to unspecified occlusion or stenosis of unspecified cerebral artery: Secondary | ICD-10-CM

## 2017-03-10 DIAGNOSIS — F0390 Unspecified dementia without behavioral disturbance: Secondary | ICD-10-CM | POA: Diagnosis present

## 2017-03-10 DIAGNOSIS — S79912A Unspecified injury of left hip, initial encounter: Secondary | ICD-10-CM | POA: Diagnosis not present

## 2017-03-10 DIAGNOSIS — K7031 Alcoholic cirrhosis of liver with ascites: Secondary | ICD-10-CM | POA: Diagnosis not present

## 2017-03-10 DIAGNOSIS — J9600 Acute respiratory failure, unspecified whether with hypoxia or hypercapnia: Secondary | ICD-10-CM | POA: Diagnosis not present

## 2017-03-10 DIAGNOSIS — S0990XA Unspecified injury of head, initial encounter: Secondary | ICD-10-CM | POA: Diagnosis not present

## 2017-03-10 DIAGNOSIS — Z7189 Other specified counseling: Secondary | ICD-10-CM | POA: Diagnosis not present

## 2017-03-10 DIAGNOSIS — K7201 Acute and subacute hepatic failure with coma: Secondary | ICD-10-CM | POA: Diagnosis not present

## 2017-03-10 DIAGNOSIS — Z79899 Other long term (current) drug therapy: Secondary | ICD-10-CM

## 2017-03-10 DIAGNOSIS — Z7984 Long term (current) use of oral hypoglycemic drugs: Secondary | ICD-10-CM

## 2017-03-10 DIAGNOSIS — E785 Hyperlipidemia, unspecified: Secondary | ICD-10-CM | POA: Diagnosis present

## 2017-03-10 DIAGNOSIS — R0989 Other specified symptoms and signs involving the circulatory and respiratory systems: Secondary | ICD-10-CM | POA: Diagnosis not present

## 2017-03-10 DIAGNOSIS — N179 Acute kidney failure, unspecified: Secondary | ICD-10-CM | POA: Diagnosis not present

## 2017-03-10 DIAGNOSIS — M25512 Pain in left shoulder: Secondary | ICD-10-CM | POA: Diagnosis present

## 2017-03-10 DIAGNOSIS — Z515 Encounter for palliative care: Secondary | ICD-10-CM

## 2017-03-10 DIAGNOSIS — Z7982 Long term (current) use of aspirin: Secondary | ICD-10-CM

## 2017-03-10 HISTORY — DX: Heart failure, unspecified: I50.9

## 2017-03-10 HISTORY — DX: Unspecified dementia without behavioral disturbance: F03.90

## 2017-03-10 LAB — URINALYSIS, ROUTINE W REFLEX MICROSCOPIC
BILIRUBIN URINE: NEGATIVE
Glucose, UA: NEGATIVE mg/dL
HGB URINE DIPSTICK: NEGATIVE
Ketones, ur: NEGATIVE mg/dL
NITRITE: NEGATIVE
PROTEIN: NEGATIVE mg/dL
Specific Gravity, Urine: 1.019 (ref 1.005–1.030)
pH: 5 (ref 5.0–8.0)

## 2017-03-10 LAB — PROTIME-INR
INR: 2.13
INR: 1.39
PROTHROMBIN TIME: 24.2 s — AB (ref 11.4–15.2)
Prothrombin Time: 17.2 seconds — ABNORMAL HIGH (ref 11.4–15.2)

## 2017-03-10 LAB — CBC
HCT: 27 % — ABNORMAL LOW (ref 36.0–46.0)
HEMOGLOBIN: 8.6 g/dL — AB (ref 12.0–15.0)
MCH: 34.3 pg — ABNORMAL HIGH (ref 26.0–34.0)
MCHC: 31.9 g/dL (ref 30.0–36.0)
MCV: 107.6 fL — ABNORMAL HIGH (ref 78.0–100.0)
PLATELETS: 130 10*3/uL — AB (ref 150–400)
RBC: 2.51 MIL/uL — ABNORMAL LOW (ref 3.87–5.11)
RDW: 23.3 % — ABNORMAL HIGH (ref 11.5–15.5)
WBC: 7.5 10*3/uL (ref 4.0–10.5)

## 2017-03-10 LAB — CBC WITH DIFFERENTIAL/PLATELET
BASOS ABS: 0 10*3/uL (ref 0.0–0.1)
Basophils Relative: 0 %
EOS PCT: 1 %
Eosinophils Absolute: 0 10*3/uL (ref 0.0–0.7)
HEMATOCRIT: 25 % — AB (ref 36.0–46.0)
Hemoglobin: 8.3 g/dL — ABNORMAL LOW (ref 12.0–15.0)
LYMPHS ABS: 1.2 10*3/uL (ref 0.7–4.0)
LYMPHS PCT: 18 %
MCH: 35.2 pg — ABNORMAL HIGH (ref 26.0–34.0)
MCHC: 33.2 g/dL (ref 30.0–36.0)
MCV: 105.9 fL — AB (ref 78.0–100.0)
MONO ABS: 0.6 10*3/uL (ref 0.1–1.0)
Monocytes Relative: 9 %
Neutro Abs: 4.7 10*3/uL (ref 1.7–7.7)
Neutrophils Relative %: 72 %
Platelets: 105 10*3/uL — ABNORMAL LOW (ref 150–400)
RBC: 2.36 MIL/uL — ABNORMAL LOW (ref 3.87–5.11)
RDW: 23 % — AB (ref 11.5–15.5)
WBC: 6.5 10*3/uL (ref 4.0–10.5)

## 2017-03-10 LAB — HEPATIC FUNCTION PANEL
ALT: 29 U/L (ref 14–54)
AST: 42 U/L — ABNORMAL HIGH (ref 15–41)
Albumin: 3.4 g/dL — ABNORMAL LOW (ref 3.5–5.0)
Alkaline Phosphatase: 124 U/L (ref 38–126)
BILIRUBIN DIRECT: 1.8 mg/dL — AB (ref 0.1–0.5)
BILIRUBIN TOTAL: 4.4 mg/dL — AB (ref 0.3–1.2)
Indirect Bilirubin: 2.6 mg/dL — ABNORMAL HIGH (ref 0.3–0.9)
Total Protein: 5.6 g/dL — ABNORMAL LOW (ref 6.5–8.1)

## 2017-03-10 LAB — BASIC METABOLIC PANEL
ANION GAP: 13 (ref 5–15)
BUN: 55 mg/dL — ABNORMAL HIGH (ref 6–20)
CHLORIDE: 99 mmol/L — AB (ref 101–111)
CO2: 26 mmol/L (ref 22–32)
CREATININE: 2.41 mg/dL — AB (ref 0.44–1.00)
Calcium: 9.1 mg/dL (ref 8.9–10.3)
GFR calc non Af Amer: 17 mL/min — ABNORMAL LOW (ref >60)
GFR, EST AFRICAN AMERICAN: 20 mL/min — AB (ref >60)
Glucose, Bld: 160 mg/dL — ABNORMAL HIGH (ref 65–99)
Potassium: 5.3 mmol/L — ABNORMAL HIGH (ref 3.5–5.1)
SODIUM: 138 mmol/L (ref 135–145)

## 2017-03-10 LAB — CBG MONITORING, ED: Glucose-Capillary: 151 mg/dL — ABNORMAL HIGH (ref 65–99)

## 2017-03-10 LAB — TSH: TSH: 171.577 u[IU]/mL — ABNORMAL HIGH (ref 0.350–4.500)

## 2017-03-10 MED ORDER — STROKE: EARLY STAGES OF RECOVERY BOOK
Freq: Once | Status: AC
  Administered 2017-03-10: 23:00:00
  Filled 2017-03-10: qty 1

## 2017-03-10 MED ORDER — FERROUS SULFATE 325 (65 FE) MG PO TABS: 325.0000 mg | ORAL_TABLET | Freq: Every day | ORAL | Status: DC

## 2017-03-10 MED ORDER — PROTHROMBIN COMPLEX CONC HUMAN 500 UNITS IV KIT
2170.0000 [IU] | PACK | Status: AC
  Administered 2017-03-10: 2170 [IU] via INTRAVENOUS
  Filled 2017-03-10: qty 87

## 2017-03-10 MED ORDER — HYDROCODONE-ACETAMINOPHEN 5-325 MG PO TABS: 1.0000 | ORAL_TABLET | Freq: Four times a day (QID) | ORAL | Status: DC | PRN

## 2017-03-10 MED ORDER — ONDANSETRON HCL 4 MG/2ML IJ SOLN
4.0000 mg | Freq: Four times a day (QID) | INTRAMUSCULAR | Status: DC | PRN
  Administered 2017-03-17: 4 mg via INTRAVENOUS
  Filled 2017-03-10: qty 2

## 2017-03-10 MED ORDER — SODIUM CHLORIDE 0.9 % IV SOLN
INTRAVENOUS | Status: DC
  Administered 2017-03-10 – 2017-03-11 (×2): via INTRAVENOUS

## 2017-03-10 MED ORDER — VITAMIN K1 10 MG/ML IJ SOLN
INTRAMUSCULAR | Status: AC
  Filled 2017-03-10: qty 1

## 2017-03-10 MED ORDER — QUETIAPINE FUMARATE 50 MG PO TABS: 50.0000 mg | ORAL_TABLET | Freq: Two times a day (BID) | ORAL | Status: DC

## 2017-03-10 MED ORDER — MORPHINE SULFATE (PF) 2 MG/ML IV SOLN
0.5000 mg | INTRAVENOUS | Status: DC | PRN
  Administered 2017-03-11 – 2017-03-16 (×5): 0.5 mg via INTRAVENOUS
  Filled 2017-03-10 (×5): qty 1

## 2017-03-10 MED ORDER — METHOCARBAMOL 1000 MG/10ML IJ SOLN
500.0000 mg | Freq: Four times a day (QID) | INTRAVENOUS | Status: DC | PRN
  Administered 2017-03-11 – 2017-03-12 (×2): 500 mg via INTRAVENOUS
  Filled 2017-03-10 (×3): qty 5

## 2017-03-10 MED ORDER — PAROXETINE HCL 20 MG PO TABS: 20.0000 mg | ORAL_TABLET | Freq: Every day | ORAL | Status: DC

## 2017-03-10 MED ORDER — METHOCARBAMOL 500 MG PO TABS: 500.0000 mg | ORAL_TABLET | Freq: Four times a day (QID) | ORAL | Status: DC | PRN

## 2017-03-10 MED ORDER — INSULIN ASPART 100 UNIT/ML ~~LOC~~ SOLN
0.0000 [IU] | Freq: Three times a day (TID) | SUBCUTANEOUS | Status: DC
  Administered 2017-03-11: 2 [IU] via SUBCUTANEOUS

## 2017-03-10 MED ORDER — ATORVASTATIN CALCIUM 40 MG PO TABS: 40.0000 mg | ORAL_TABLET | Freq: Every evening | ORAL | Status: DC

## 2017-03-10 MED ORDER — METOPROLOL TARTRATE 50 MG PO TABS: 50.0000 mg | ORAL_TABLET | Freq: Two times a day (BID) | ORAL | Status: DC

## 2017-03-10 MED ORDER — SODIUM CHLORIDE 0.9 % IV BOLUS (SEPSIS)
500.0000 mL | Freq: Once | INTRAVENOUS | Status: AC
  Administered 2017-03-10: 500 mL via INTRAVENOUS

## 2017-03-10 MED ORDER — MORPHINE SULFATE (PF) 2 MG/ML IV SOLN
INTRAVENOUS | Status: AC
  Administered 2017-03-10: 1 mg via INTRAVENOUS
  Filled 2017-03-10: qty 1

## 2017-03-10 MED ORDER — VITAMIN K1 10 MG/ML IJ SOLN
10.0000 mg | INTRAVENOUS | Status: AC
  Administered 2017-03-10: 10 mg via INTRAVENOUS
  Filled 2017-03-10: qty 1

## 2017-03-10 MED ORDER — MORPHINE SULFATE (PF) 2 MG/ML IV SOLN
1.0000 mg | Freq: Once | INTRAVENOUS | Status: AC
  Administered 2017-03-10 (×2): 1 mg via INTRAVENOUS

## 2017-03-10 NOTE — H&P (Signed)
History and Physical    Glenda Dickson:[id number] DOB: [birth year] DOA: 03/10/2017  PCP: Benita Stabile, MD Consultants:  Purvis Sheffield - cardiology Patient coming from: Assisted Living; Glenda Dickson: Glenda Dickson: Glenda Dickson, [phone]  Chief Complaint: leg pain  HPI: Glenda Dickson is a 83 y.o. female with medical history significant of HTN, HLD, remote left hip fracture, DM, Dementia/Depression, CHF (EF 25-30%, abnormal diastolic function of indeterminant grade in 5/18), Anemia (baseline appears to be 11-12), and afib on Coumadin presenting with leg pain after several falls.  Very confused since yesterday AM, acts like she wants to go to sleep.  Has CHF and has been retaining a lot of fluid, has been very yellow.  She fell Saturday AM.    Assisted Living was not sure why this happened, thought she was just off balance.  She did not initally complain of pain.  This AM, no better and was c/o pain.  The facility called Dr. Margo Aye and then facility was concerned enough to have her brought in for evaluation.  No fever.  Only complaining of leg pain.  Her roommate did report to her Glenda Dickson today that the patient had been complaining of headache.  With CVA noted on CT, the family thinks the stroke happened to cause a balance issue which led to the fall.    Yesterday, she was somnolent, mumbling, severe leg pain.  She did eat breakfast yesterday as well as lunch today.  More confused than usual but she does that occasionally.  Glenda Dickson thought the same thing on Saturday.   ED Course:  Per Dr. Rosalia Hammers: 1- distal femur fracture- discussed with Dr. Eulah Pont. Plan immobilizer, elevate, transfer to [facility] cone. Nothing by mouth after midnight. Dr. Eulah Pont will for surgery tomorrow.  2 stroke patient has had difficulty walking. Unclear clinical timeframe.  3 renal insufficiency creatinine is elevated today to 2.41. She is receiving IV fluids.  4 anemia hemoglobin is 8.3. Blood loss likely and left thigh. She will need to have her  Coumadin held.  5 mild hyperkalemia with potassium of 5.3  6 A. fib rate is controlled. Patient is on anticoagulation.  Discussed plans with Glenda Dickson. She adamantly refuses transfer to Dr. Eulah Pont. Discussed with Dr. Aundria Rud on call for [facility] orthopedics. He reviewed x-rays. He feels that this should go to trauma orthopedist. I have discussed this with the Glenda Dickson. [facility] being consulted for [facility].  [facility] states he did not have capacity.  Discussed again with Glenda Dickson and with Dr. Ophelia Charter. Discussed again with Dr. Eulah Pont. Patient will be transferred to [facility] cone to the hospitalist service.   Review of Systems: Unable to perform  Ambulatory Status:  Usually walks with a [place]; unable to bear weight since Saturday  Past Medical History:  Diagnosis Date  . Anemia, normocytic normochromic 06/26/2012   H&H-11.2/33.7, normal MCV in 06/2012   . Atrial fibrillation (HCC)    on anticoagulation  . CHF (congestive heart failure) (HCC)   . Dementia 10/2016   Aricept did not help, mood stabilizer did  . Depression   . Diabetes mellitus type II    no insulin  . Fracture of left hip (HCC) 2006   intertrochanteric; ORIF - repaired by [facility]  . Hyperlipidemia   . Hypertension     Past Surgical History:  Procedure Laterality Date  . ABDOMINAL HYSTERECTOMY    . COLONOSCOPY  2003   Negative screening study.  [facility] HIP FRACTURE SURGERY     Fixation  Social History   Social History  . Marital status: Widowed    Spouse name: N/A  . Number of children: N/A  . Years of education: N/A   Occupational History  . retired    Social History Main Topics  . Smoking status: Never Smoker  . Smokeless tobacco: Never Used  . Alcohol use No  . Drug use: No  . Sexual activity: Not Currently   Other Topics Concern  . Not on file   Social History Narrative  . No narrative on file    Allergies  Allergen Reactions  . Benzalkonium  Chloride-Alcohol Other (See Comments)    Patient/family is not familiar with this allergy  . Codeine Itching and Nausea Only    Family History  Problem Relation Age of Onset  . Hypertension Mother   . Heart failure Mother   . Heart attack Father   . Hypertension Father   . Hypertension Sister        Atrial Fib  . Hypertension Brother        4/6 brothers with htn    Prior to Admission medications   Medication Sig Start Date End Date Taking? Authorizing Provider  acetaminophen (TYLENOL) 325 MG tablet Take 650 mg by mouth every 6 (six) hours as needed for mild pain or moderate pain.    Yes [provider]  atorvastatin (LIPITOR) 40 MG tablet Take 40 mg by mouth every evening.    Yes [provider]  Calcium Citrate-Vitamin D 315-250 MG-UNIT TABS Take 1 tablet by mouth daily.   Yes [provider]  ferrous sulfate 325 (65 FE) MG tablet Take 325 mg by mouth daily with breakfast.  06/29/16  Yes [provider]  furosemide (LASIX) 20 MG tablet Take 20 mg by mouth daily.   Yes [provider]  glipiZIDE (GLUCOTROL XL) 2.5 MG 24 hr tablet Take 2.5 mg by mouth daily with breakfast.    Yes [provider]  metoprolol tartrate (LOPRESSOR) 50 MG tablet Take 1 tablet by mouth 2 (two) times daily. 01/13/17  Yes [provider]  Multiple Vitamin (MULTIVITAMIN) tablet Take 1 tablet by mouth daily.   Yes [provider]  nystatin (MYCOSTATIN/NYSTOP) powder Apply topically 3 (three) times daily as needed.   Yes [provider]  PARoxetine (PAXIL) 20 MG tablet Take 20 mg by mouth daily. 01/13/17  Yes [provider]  potassium chloride (KLOR-CON) 20 MEQ packet Take 20 mEq by mouth daily.   Yes [provider]  QUEtiapine (SEROQUEL) 50 MG tablet Take 1 tablet by mouth 2 (two) times daily. 02/12/17  Yes [provider]  ramipril (ALTACE) 10 MG capsule Take 1 capsule (10 mg total) by mouth daily. 01/16/17   Yes Laqueta Linden, MD  traZODone (DESYREL) 150 MG tablet Take 150 mg by mouth at bedtime as needed for sleep.   Yes [provider]  warfarin (COUMADIN) 2.5 MG tablet Take 2.5 mg by mouth daily at 6 PM. Take 2 tablet tonight (6/11) then 1.5 tablet daily except on Sunday, Tuesday and Thursday, take 1 tablet   Yes [provider]  LORazepam (ATIVAN) 0.5 MG tablet Take 1 tablet (0.5 mg total) by mouth every 8 (eight) hours as needed for anxiety. 01/14/17   Zadie Rhine, MD  metoprolol succinate (TOPROL-XL) 50 MG 24 hr tablet Take 1 tablet (50 mg total) by mouth 2 (two) times daily. Take with or immediately following a meal.  This replaces metoprolol tartrate 01/30/17 04/30/17  Laqueta LindenKoneswaran, Suresh A, MD    Physical Exam: Vitals:   03/10/17 2029 03/10/17 2113 03/10/17 2124 03/10/17 2130  BP:   (!) 156/139 (!) 130/110  Pulse: (!) 115     Resp: 18 19 18  (!) 22  Temp:      TempSrc:      SpO2:  93% 95% 91%  Weight:         General:  Appears calm and comfortable and is NAD; she is mildly jaundiced and does not make good eye contact or attempt to engage unless directly approached and then only briefly Eyes:  PERRL, EOMI, normal lids, iris, mild scleral icterus ENT: grossly normal hearing, lips & tongue, mmm Neck:  no LAD, masses or thyromegaly Cardiovascular:  RRR, no m/r/g. No LE edema.  Respiratory:  CTA bilaterally, no w/r/r. Normal respiratory effort. Abdomen:  soft, possible mild nonspecific TTP, nd, NABS Skin:  no rash or induration seen on limited exam Musculoskeletal: Left leg is wrapped In a splint.  She has good pulses and circulation in her left foot.  Exam per Dr. Rosalia Hammersay: Left upper extremity with large hematoma left hip buttock and down the left lateral thigh. Diffuse swelling with tenderness to palpation in hip and throughout left upper leg. It appears worse at the knee. Dorsal pedalis pulses intact. Foot is warm and no trauma is noted below the knee.    Psychiatric: Answers few questions, does not eye contact unless direct attempt at communication is made within a short range Neurologic: Unable to assess  Labs on Admission: I have personally reviewed following labs and imaging studies  CBC:  Recent Labs Lab 03/10/17 1509  WBC 6.5  NEUTROABS 4.7  HGB 8.3*  HCT 25.0*  MCV 105.9*  PLT 105*   Basic Metabolic Panel:  Recent Labs Lab 03/10/17 1509  NA 138  K 5.3*  CL 99*  CO2 26  GLUCOSE 160*  BUN 55*  CREATININE 2.41*  CALCIUM 9.1   GFR: Estimated Creatinine Clearance: 19.1 mL/min (A) (by C-G formula based on SCr of 2.41 mg/dL (H)). Liver Function Tests:  Recent Labs Lab 03/10/17 1509  AST 42*  ALT 29  ALKPHOS 124  BILITOT 4.4*  PROT 5.6*  ALBUMIN 3.4*   No results for input(s): LIPASE, AMYLASE in the last 168 hours. No results for input(s): AMMONIA in the last 168 hours. Coagulation Profile:  Recent Labs Lab 03/10/17 1509  INR 2.13   Cardiac Enzymes: No results for input(s): CKTOTAL, CKMB, CKMBINDEX, TROPONINI in the last 168 hours. BNP (last 3 results) No results for input(s): PROBNP in the last 8760 hours. HbA1C: No results for input(s): HGBA1C in the last 72 hours. CBG:  Recent Labs Lab 03/10/17 1313  GLUCAP 151*   Lipid Profile: No results for input(s): CHOL, HDL, LDLCALC, TRIG, CHOLHDL, LDLDIRECT in the last 72 hours. Thyroid Function Tests: No results for input(s): TSH, T4TOTAL, FREET4, T3FREE, THYROIDAB in the last 72 hours. Anemia Panel: No results for input(s): VITAMINB12, FOLATE, FERRITIN, TIBC, IRON, RETICCTPCT in the last 72 hours. Urine analysis:    Component Value Date/Time   COLORURINE AMBER (A) 03/10/2017 1000   APPEARANCEUR TURBID (A) 03/10/2017 1000   LABSPEC 1.019 03/10/2017 1000   PHURINE 5.0 03/10/2017 1000   GLUCOSEU NEGATIVE 03/10/2017 1000   HGBUR NEGATIVE 03/10/2017 1000   BILIRUBINUR NEGATIVE 03/10/2017 1000   KETONESUR NEGATIVE 03/10/2017 1000   PROTEINUR  NEGATIVE 03/10/2017 1000   NITRITE NEGATIVE 03/10/2017 1000   LEUKOCYTESUR SMALL (A) 03/10/2017 1000  Creatinine Clearance: Estimated Creatinine Clearance: 19.1 mL/min (A) (by C-G formula based on SCr of 2.41 mg/dL (H)).  Sepsis Labs: @LABRCNTIP (procalcitonin:4,lacticidven:4) )No results found for this or any previous visit (from the past 240 hour(s)).   Radiological Exams on Admission: Dg Chest 1 View  Result Date: 03/10/2017 CLINICAL DATA:  83 year old with multiple recent falls, presenting with jaundice, slurred speech and shortness of breath. Current history of hypertension, diabetes, chronic diastolic CHF. EXAM: CHEST 1 VIEW COMPARISON:  01/14/2017, 10/23/2016 and earlier. FINDINGS: Cardiac silhouette moderately to markedly enlarged, unchanged. Pulmonary vascularity normal without evidence of pulmonary edema. Chronic dense consolidation in the left lower lobe and associated left pleural effusion, unchanged dating back to January, 2018. Lungs otherwise clear. No visible right pleural effusion. Degenerative changes again noted involving the right shoulder. IMPRESSION: Chronic left lower lobe atelectasis and/or pneumonia and left pleural effusion. Stable cardiomegaly. No new abnormalities since 04/24 1,018. Electronically Signed   By: Hulan Saas M.D.   On: 03/10/2017 15:08   Ct Head Wo Contrast  Result Date: 03/10/2017 CLINICAL DATA:  Several recent falls.  Slurred speech. EXAM: CT HEAD WITHOUT CONTRAST TECHNIQUE: Contiguous axial images were obtained from the base of the skull through the vertex without intravenous contrast. COMPARISON:  Head CT October 23, 2016 and brain MRI October 23, 2016 FINDINGS: Brain: There is moderate diffuse atrophy. There is a focal infarct in the inferior right cerebellar hemisphere with cytotoxic edema in the inferior to mid right cerebellar hemisphere, not present on prior studies. There is no intracranial mass, hemorrhage, extra-axial fluid collection, or  midline shift. There is mild small vessel disease in the centra semiovale bilaterally. No other recent/acute appearing infarct is appreciable on this study. Vascular: There is no appreciable hyperdense vessel. There is calcification in each carotid siphon as well as in each distal vertebral artery. Skull: The bony calvarium appears intact. Sinuses/Orbits: There is mucosal thickening in several ethmoid air cells bilaterally. There is diffuse opacification in the right sphenoid sinus. Other paranasal sinuses are clear. Orbits appear symmetric bilaterally. Other: Mastoid air cells are clear. IMPRESSION: Recent appearing infarct in the inferior right cerebellar hemisphere. Suspect infarct involving a portion of the right posterior inferior cerebral artery distribution. No other acute appearing infarct evident. There is underlying atrophy with mild periventricular small vessel disease. There is no mass, hemorrhage, or extra-axial fluid collection. There are foci of arterial calcification in the anterior and posterior circulations. There is paranasal sinus disease, most marked in the right sphenoid sinus region. Electronically Signed   By: Bretta Bang III M.D.   On: 03/10/2017 14:08   Dg Hip Unilat W Or Wo Pelvis 2-3 Views Left  Result Date: 03/10/2017 CLINICAL DATA:  Pain.  Multiple recent falls EXAM: DG HIP (WITH OR WITHOUT PELVIS) 2-3V LEFT COMPARISON:  None. FINDINGS: Frontal pelvis as well as frontal and lateral left hip images or obtained. There is postoperative change in the proximal left femur. Tip of screw is in the proximal femoral head. There is myositis ossificans dorsal to the greater tuberosity. There is mild symmetric narrowing of both hip joints. There is no acute fracture or dislocation. There is extensive aortoiliac and bilateral common and superficial femoral artery atherosclerosis. IMPRESSION: No acute fracture or dislocation. Mild narrowing of both hip joints. Myositis ossifications superior  to the left greater trochanter. Postoperative change on the left. Extensive aortoiliac and femoral artery atherosclerosis. Electronically Signed   By: Bretta Bang III M.D.   On: 03/10/2017 14:43   Dg Femur  Min 2 Views Left  Result Date: 03/10/2017 CLINICAL DATA:  Pain following recent fall EXAM: LEFT FEMUR 2 VIEWS COMPARISON:  None. FINDINGS: Frontal and lateral views were obtained. There is a comminuted fracture of the distal femoral metaphysis with posterior displacement of distal major fracture fragment with respect to proximal major fracture fragment. More proximally, there is no acute fracture or dislocation. There is evidence of old trauma with postoperative fixation. There is myositis ossificans superior to the greater trochanter. No dislocation. There is a joint effusion. There is extensive vascular atherosclerosis. IMPRESSION: Comminuted fracture distal femoral metaphysis with posterior displacement of distal major fracture fragment with respect to proximal major fragment. Advanced arthropathy in the knee joint without frank dislocation. There is a moderate joint effusion. Postoperative change proximally without acute fracture or dislocation proximally. There is extensive femoral artery atherosclerosis. Electronically Signed   By: Bretta Bang III M.D.   On: 03/10/2017 14:45    EKG: Independently reviewed.  Afib with rate 103; low voltage, nonspecific ST changes with no evidence of acute ischemia  Assessment/Plan Principal Problem:   Closed comminuted intra-articular fracture of distal femur, left, initial encounter (HCC) Active Problems:   Hypertension   Diabetes mellitus type II, non insulin dependent (HCC)   Hyperlipidemia   Atrial fibrillation (HCC)   Anemia, normocytic normochromic   Combined congestive systolic and diastolic heart failure (HCC)   Hyperkalemia   ARF (acute renal failure) (HCC)   Hyperbilirubinemia   Cerebellar infarct (HCC)   Femur  fracture -Mechanical fall resulting in hip fracture, possibly resulting from balance instability resulting from cerebellar infarct (see below) -Orthopedics consult in AM; there was some confusion about where to send the patient but Dr. Eulah Pont has graciously accepted the patient and the family is in agreement with his consultation in the AM.  They may also request a second opinion from Dr. Carola Frost, who is reportedly the only other appropriate surgeon in [facility] for this particular fracture. -NPO after midnight in anticipation of surgical repair tomorrow -SCD/Foot Pump overnight, start Lovenox post-operatively (or as per ortho) -Pain control with Vicodin/morphine as per hip fracture order set -SW consult for SNF rehab placement -Will need PT consult post-operatively -Hip fracture order set utilized -Will admit to SDU (see below)  Right cerebellar infarct -Acute encephalopathy likely related to CVA - although she has some underlying dementia so it is difficult to tell the extent -Fall may have been a sequelae of CVA due to balance difficulty -Telemetry monitoring -MRI/MRA -Carotid dopplers -Echo done in 5/18, will not repeat -Risk stratification with FLP; will also check TSH -ASA daily post-operatively or as per neuro -PT/OT/ST/Nutrition Consults post-operatively -SW consult for placement, as above -Hold Trazodone but continue Seroquel and Paxil -Neurology consult upon arrival at [facility]; neurology is aware of transfer  HTN -Since the acuity of the infarct is unknown, permissive HTN may not be necessary -She may benefit from having beta blocker therapy in the peri-operative period -Will continue Lopressor for now, as this is also likely to help with rate control with her afib  HLD -Check FLP -Lipids in 1/17: TC 101, HDL 30, LDL 52, TG 96 -Continue Lipitor 40 mg qhs  Diabetes -Glucose 160 -A1c 5.8 in 1/18 -Will not recheck A1c at this time -Cover with SSI -Hold Glucotrol for  now  Afib on anticoagulation -INR 2.13 -Rate control with Lopressor -Reversal initiated in the ER by Dr. Rosalia Hammers with Theodoro Parma and IV Vitamin K both for concerns about bleeding as well as for likely  operative repair -CHA2DS2-VASc Score is now 8, with an adjusted stroke rate of 6.7%; she will need resumption of anticoagulation as soon as reasonably safe  Hyperkalemia -K 5.3 -likely related to KCl supplementation and dehydration/ARF -Hold KCl -Replete with IVF  Acute renal failure -BUN 55/Creatinine 2.41/GFR 17; prior 30/1.68/26 in 4/18 -Hold Lasix -Hold ACE-I -Replete with IVF -She does have underlying CKD which is likely complicating this issue -NPO in anticipation of swallow evaluation tomorrow  Hyperbilirubinemia Bili 4.4/AST 42/ALT 29; 2.0/28/23 in 1/18  Anemia -Hgb 8.3, prior 11.8 on 4/24 -Baseline appears to be 11-12 -Currently, there is concern for ongoing hemorrhage into her knee  -Will monitor CBC q6h -If downtrending Hgb, she will need a transfusion -Her family has been counseled regarding the risks/benefits of transfusion and are in agreement with transfusion if deemed necessary by her medical team -INR reversal as above -Continue FeSO4  Heart failure -EF 25-30%, abnormal diastolic function of indeterminant grade in 5/18 -No Lasix for now -No ACE due to renal failure -Continue beta blocker for now as she does appear to be compensated at this time  *This patient is critically ill and will be admitted to the [facility] SDU. Total critical care time: 115 minutes Critical care time was exclusive of separately billable procedures and treating other patients. Critical care was necessary to treat or prevent imminent or life-threatening deterioration. Critical care was time spent personally by me on the following activities: development of treatment plan with patient and/or surrogate as well as nursing, discussions with consultants, evaluation of patient's response to treatment,  examination of patient, obtaining history from patient or surrogate, ordering and performing treatments and interventions, ordering and review of laboratory studies, ordering and review of radiographic studies, pulse oximetry and re-evaluation of patient's condition.  DVT prophylaxis: SCDs Code Status: Full - confirmed with family Family Communication: Multiple family members including both the son and Glenda Dickson were present throughout the evaluation Disposition Plan: SNF once clinically improved Consults called: Orthopedics; neurology; ST/SW; will need PT/OT/Nutrition Admission status: Admit - It is my clinical opinion that admission to INPATIENT is reasonable and necessary because this patient will require at least 2 midnights in the hospital to treat this condition based on the medical complexity of the problems presented.  Given the aforementioned information, the predictability of an adverse outcome is felt to be significant.     Jonah Blue MD [facility]  If 7PM-7AM, please contact night-coverage [email or web address] Password [facility]  03/10/2017, 9:33 PM

## 2017-03-10 NOTE — ED Triage Notes (Signed)
Pt from [facility]. [phone]. They called daughter today due to multiple falls over the weekend. Pt has been c/o left knee pain per daughter. Pt denies any pain at present. Pt slumping over right side, daughter states that is not normal. Daughter states can understand her mother but has not since yesterday due to slurring/mumbling. Pt jaundiced.

## 2017-03-10 NOTE — ED Provider Notes (Addendum)
[facility] DEPT Provider Note   CSN: [id number] Arrival date & time: 03/10/17  1259     History   Chief Complaint Chief Complaint  Patient presents with  . Fall  . Altered Mental Status  level 5 caveat- patient unable to give history  HPI Glenda Dickson is a 83 y.o. female.  HPI  Patient has dementia and appears ill she is not able to give history daughter is at bedside and  is historian 83 y.o. Female presents with daughter from assisted Living care facility. Daughter reports that she fell June 5. She did not complain of pain at that time and did not have an evaluation. They've noticed a large bruise in the left hip area. She began complaining of pain in her left leg today and is being brought in secondary to this. Patient has had decreasing responsiveness since being transported to hospital per the daughter. Otherwise the daughter reports that she has been at her baseline which is to walk, speak and eat without difficulty. Daughter reports that she ate lunch today without any problem. Past Medical History:  Diagnosis Date  . Anemia, normocytic normochromic 06/26/2012   H&H-11.2/33.7, normal MCV in 06/2012   . Atrial fibrillation (HCC)   . Dementia   . Depression   . Diabetes mellitus type II    no insulin  . Fracture of left hip (HCC) 2006   intertrochanteric; ORIF  . Hyperlipidemia   . Hypertension     Patient Active Problem List   Diagnosis Date Noted  . Pressure injury of skin 09/30/2016  . CAP (community acquired pneumonia) 09/30/2016  . Supratherapeutic INR 09/29/2016  . Chronic diastolic CHF (congestive heart failure) (HCC) 09/29/2016  . Protein calorie malnutrition (HCC) 09/29/2016  . Leg swelling 09/29/2016  . Encounter for therapeutic drug monitoring 11/06/2015  . Chronic kidney disease (CKD), stage IV (severe) (HCC) 08/24/2012  . Cerebrovascular disease 07/20/2012  . Anemia, normocytic normochromic 06/26/2012  . Hypertension   . Diabetes mellitus type  II, non insulin dependent (HCC)   . Hyperlipidemia   . Atrial fibrillation [facility])     Past Surgical History:  Procedure Laterality Date  . ABDOMINAL HYSTERECTOMY    . COLONOSCOPY  2003   Negative screening study.  Drucilla Chalet HIP FRACTURE SURGERY     Fixation    OB History    No data available       Home Medications    Prior to Admission medications   Medication Sig Start Date End Date Taking? Authorizing Provider  acetaminophen (TYLENOL) 325 MG tablet Take 650 mg by mouth every 6 (six) hours as needed for mild pain or moderate pain.    Yes [provider]  atorvastatin (LIPITOR) 40 MG tablet Take 40 mg by mouth every evening.    Yes [provider]  Calcium Citrate-Vitamin D 315-250 MG-UNIT TABS Take 1 tablet by mouth daily.   Yes [provider]  ferrous sulfate 325 (65 FE) MG tablet Take 325 mg by mouth daily with breakfast.  06/29/16  Yes [provider]  furosemide (LASIX) 20 MG tablet Take 20 mg by mouth daily.   Yes [provider]  glipiZIDE (GLUCOTROL XL) 2.5 MG 24 hr tablet Take 2.5 mg by mouth daily with breakfast.    Yes [provider]  metoprolol tartrate (LOPRESSOR) 50 MG tablet Take 1 tablet by mouth 2 (two) times daily. 01/13/17  Yes [provider]  Multiple Vitamin (MULTIVITAMIN) tablet Take 1 tablet by mouth daily.  Yes [provider]  nystatin (MYCOSTATIN/NYSTOP) powder Apply topically 3 (three) times daily as needed.   Yes [provider]  PARoxetine (PAXIL) 20 MG tablet Take 20 mg by mouth daily. 01/13/17  Yes [provider]  potassium chloride (KLOR-CON) 20 MEQ packet Take 20 mEq by mouth daily.   Yes [provider]  QUEtiapine (SEROQUEL) 50 MG tablet Take 1 tablet by mouth 2 (two) times daily. 02/12/17  Yes [provider]  ramipril (ALTACE) 10 MG capsule Take 1 capsule (10 mg total) by mouth daily. 01/16/17  Yes Laqueta Linden, MD    traZODone (DESYREL) 150 MG tablet Take 150 mg by mouth at bedtime as needed for sleep.   Yes [provider]  warfarin (COUMADIN) 2.5 MG tablet Take 2.5 mg by mouth daily at 6 PM. Take 2 tablet tonight (6/11) then 1.5 tablet daily except on Sunday, Tuesday and Thursday, take 1 tablet   Yes [provider]  LORazepam (ATIVAN) 0.5 MG tablet Take 1 tablet (0.5 mg total) by mouth every 8 (eight) hours as needed for anxiety. 01/14/17   Zadie Rhine, MD  metoprolol succinate (TOPROL-XL) 50 MG 24 hr tablet Take 1 tablet (50 mg total) by mouth 2 (two) times daily. Take with or immediately following a meal.  This replaces metoprolol tartrate 01/30/17 04/30/17  Laqueta Linden, MD    Family History Family History  Problem Relation Age of Onset  . Hypertension Mother   . Heart failure Mother   . Heart attack Father   . Hypertension Father   . Hypertension Sister        Atrial Fib  . Hypertension Brother        4/6 brothers with htn    Social History Social History  Substance Use Topics  . Smoking status: Never Smoker  . Smokeless tobacco: Never Used  . Alcohol use No     Allergies   Benzalkonium chloride-alcohol and Codeine   Review of Systems Review of Systems  Unable to perform ROS: Dementia     Physical Exam Updated Vital Signs BP 116/66   Pulse 94   Temp 97.7 F (36.5 C) (Oral)   Resp (!) 22   SpO2 97%   Physical Exam  Constitutional: She appears well-developed.  Ill-appearing elderly female who does not appear to be in acute distress  HENT:  Head: Normocephalic and atraumatic.  Right Ear: External ear normal.  Left Ear: External ear normal.  Mouth/Throat: Oropharynx is clear and moist.  Eyes: Pupils are equal, round, and reactive to light.  Neck: Normal range of motion.  Cardiovascular: An irregularly irregular rhythm present.  Pulmonary/Chest: Effort normal and breath sounds normal.  Abdominal: Soft. Bowel sounds are normal.   Musculoskeletal:  Left upper extremity with large hematoma left hip buttock and down the left lateral thigh. Diffuse swelling with tenderness to palpation in hip and throughout left upper leg. It appears worse at the knee. Dorsal pedalis pulses intact. Foot is warm and no trauma is noted below the knee.  Neurological:  Patient is arousable with verbal stimuli and attempts to answer questions and is oriented to hospital but not the specific hospital. She is unable to give me her birthdate. Patient is able to raise up of her upper extremities and hold against gravity although there appears to be some drift bilaterally more on the left than right. She is confused and her speech appears clear. Face is symmetrical with symmetrical mouth movement is able to squeeze  my hands bilaterally with fingers. She is able to move the right lower extremity but the left is not able to be moved due to pain although she is able to move her foot well.  Skin: Skin is warm. Capillary refill takes less than 2 seconds.  Psychiatric: She has a normal mood and affect.  Vitals reviewed.    ED Treatments / Results  Labs (all labs ordered are listed, but only abnormal results are displayed) Labs Reviewed  CBG MONITORING, ED - Abnormal; Notable for the following:       Result Value   Glucose-Capillary 151 (*)    All other components within normal limits  BASIC METABOLIC PANEL  URINALYSIS, ROUTINE W REFLEX MICROSCOPIC  CBC WITH DIFFERENTIAL/PLATELET  HEPATIC FUNCTION PANEL  PROTIME-INR  CBG MONITORING, ED    EKG  EKG Interpretation  Date/Time:  Monday March 10 2017 13:24:47 EDT Ventricular Rate:  103 PR Interval:    QRS Duration: 82 QT Interval:  331 QTC Calculation: 434 R Axis:   101 Text Interpretation:  Atrial fibrillation Right axis deviation Low voltage, precordial leads Nonspecific T abnormalities, lateral leads Confirmed by Margarita Grizzle [phone]) on 03/10/2017 3:17:55 PM       Radiology Dg Chest 1  View  Result Date: 03/10/2017 CLINICAL DATA:  83 year old with multiple recent falls, presenting with jaundice, slurred speech and shortness of breath. Current history of hypertension, diabetes, chronic diastolic CHF. EXAM: CHEST 1 VIEW COMPARISON:  01/14/2017, 10/23/2016 and earlier. FINDINGS: Cardiac silhouette moderately to markedly enlarged, unchanged. Pulmonary vascularity normal without evidence of pulmonary edema. Chronic dense consolidation in the left lower lobe and associated left pleural effusion, unchanged dating back to January, 2018. Lungs otherwise clear. No visible right pleural effusion. Degenerative changes again noted involving the right shoulder. IMPRESSION: Chronic left lower lobe atelectasis and/or pneumonia and left pleural effusion. Stable cardiomegaly. No new abnormalities since 04/24 1,018. Electronically Signed   By: Hulan Saas M.D.   On: 03/10/2017 15:08   Ct Head Wo Contrast  Result Date: 03/10/2017 CLINICAL DATA:  Several recent falls.  Slurred speech. EXAM: CT HEAD WITHOUT CONTRAST TECHNIQUE: Contiguous axial images were obtained from the base of the skull through the vertex without intravenous contrast. COMPARISON:  Head CT October 23, 2016 and brain MRI October 23, 2016 FINDINGS: Brain: There is moderate diffuse atrophy. There is a focal infarct in the inferior right cerebellar hemisphere with cytotoxic edema in the inferior to mid right cerebellar hemisphere, not present on prior studies. There is no intracranial mass, hemorrhage, extra-axial fluid collection, or midline shift. There is mild small vessel disease in the centra semiovale bilaterally. No other recent/acute appearing infarct is appreciable on this study. Vascular: There is no appreciable hyperdense vessel. There is calcification in each carotid siphon as well as in each distal vertebral artery. Skull: The bony calvarium appears intact. Sinuses/Orbits: There is mucosal thickening in several ethmoid air cells  bilaterally. There is diffuse opacification in the right sphenoid sinus. Other paranasal sinuses are clear. Orbits appear symmetric bilaterally. Other: Mastoid air cells are clear. IMPRESSION: Recent appearing infarct in the inferior right cerebellar hemisphere. Suspect infarct involving a portion of the right posterior inferior cerebral artery distribution. No other acute appearing infarct evident. There is underlying atrophy with mild periventricular small vessel disease. There is no mass, hemorrhage, or extra-axial fluid collection. There are foci of arterial calcification in the anterior and posterior circulations. There is paranasal sinus disease, most marked in the right sphenoid sinus region. Electronically Signed  By: Bretta BangWilliam  Woodruff III M.D.   On: 03/10/2017 14:08   Dg Hip Unilat W Or Wo Pelvis 2-3 Views Left  Result Date: 03/10/2017 CLINICAL DATA:  Pain.  Multiple recent falls EXAM: DG HIP (WITH OR WITHOUT PELVIS) 2-3V LEFT COMPARISON:  None. FINDINGS: Frontal pelvis as well as frontal and lateral left hip images or obtained. There is postoperative change in the proximal left femur. Tip of screw is in the proximal femoral head. There is myositis ossificans dorsal to the greater tuberosity. There is mild symmetric narrowing of both hip joints. There is no acute fracture or dislocation. There is extensive aortoiliac and bilateral common and superficial femoral artery atherosclerosis. IMPRESSION: No acute fracture or dislocation. Mild narrowing of both hip joints. Myositis ossifications superior to the left greater trochanter. Postoperative change on the left. Extensive aortoiliac and femoral artery atherosclerosis. Electronically Signed   By: Bretta BangWilliam  Woodruff III M.D.   On: 03/10/2017 14:43   Dg Femur Min 2 Views Left  Result Date: 03/10/2017 CLINICAL DATA:  Pain following recent fall EXAM: LEFT FEMUR 2 VIEWS COMPARISON:  None. FINDINGS: Frontal and lateral views were obtained. There is a  comminuted fracture of the distal femoral metaphysis with posterior displacement of distal major fracture fragment with respect to proximal major fracture fragment. More proximally, there is no acute fracture or dislocation. There is evidence of old trauma with postoperative fixation. There is myositis ossificans superior to the greater trochanter. No dislocation. There is a joint effusion. There is extensive vascular atherosclerosis. IMPRESSION: Comminuted fracture distal femoral metaphysis with posterior displacement of distal major fracture fragment with respect to proximal major fragment. Advanced arthropathy in the knee joint without frank dislocation. There is a moderate joint effusion. Postoperative change proximally without acute fracture or dislocation proximally. There is extensive femoral artery atherosclerosis. Electronically Signed   By: Bretta BangWilliam  Woodruff III M.D.   On: 03/10/2017 14:45    Procedures Procedures (including critical care time)  Medications Ordered in ED Medications - No data to display   Initial Impression / Assessment and Plan / ED Course  I have reviewed the triage vital signs and the nursing notes.  Pertinent labs & imaging results that were available during my care of the patient were reviewed by me and considered in my medical decision making (see chart for details).     1- distal femur fracture- discussed with Dr. Eulah PontMurphy. Plan immobilizer, elevate[facility] Nothing by mouth after midnight. Dr. Eulah PontMurphy will for surgery tomorrow. 2 stroke patient has had difficulty walking.  Unclear clinical timeframe. 3 renal insufficiency creatinine is elevated today to 2.41. She is receiving IV fluids. 4 anemia hemoglobin is 8.3. Blood loss likely and left thigh. She will need to have her Coumadin held. 5 mild hyperkalemia with potassium of 5.3 6 A. fib rate is controlled. Patient is on anticoagulation.   Discussed plans with daughter. She adamantly refuses  transfer to Dr. Eulah PontMurphy. D[facility] Aundria [facility] He  reviewed x-rays.  He feels that this should go to trauma orthopedist. I have discussed this with the daughter. [facility]  [facility] states he did not have capacity. Discussed again with daughter and with Dr. Ophelia CharterYates. Discussed again with Dr. Eulah PontMurphy. [facility].  CRITICAL CARE Performed by: Hilario QuarryAY,Evarose Altland S Total critical care time  60 minnutes Critical care time was exclusive of separately billable procedures and  treating other patients. Critical care was necessary to treat or prevent imminent or life-threatening deterioration. Critical care was time spent personally by me on the following activities: development of treatment plan with patient and/or surrogate as well as nursing, discussions with consultants, evaluation of patient's response to treatment, examination of patient, obtaining history from patient or surrogate, ordering and performing treatments and interventions, ordering and review of laboratory studies, ordering and review of radiographic studies, pulse oximetry and re-evaluation of patient's condition.  Final Clinical Impressions(s) / ED Diagnoses   Final diagnoses:  Cerebrovascular accident (CVA), unspecified mechanism (HCC)  Anemia, unspecified type  Closed fracture of left femur, unspecified fracture morphology, unspecified portion of femur, initial encounter (HCC)  Anticoagulant long-term use    New Prescriptions New Prescriptions   No medications on file     Margarita Grizzle, MD 03/10/17 Sable Feil    Margarita Grizzle, MD 03/10/17 [phone]

## 2017-03-11 ENCOUNTER — Inpatient Hospital Stay (HOSPITAL_COMMUNITY): Payer: Medicare Other

## 2017-03-11 DIAGNOSIS — R7989 Other specified abnormal findings of blood chemistry: Secondary | ICD-10-CM

## 2017-03-11 DIAGNOSIS — D649 Anemia, unspecified: Secondary | ICD-10-CM

## 2017-03-11 DIAGNOSIS — E039 Hypothyroidism, unspecified: Secondary | ICD-10-CM | POA: Diagnosis present

## 2017-03-11 DIAGNOSIS — I482 Chronic atrial fibrillation: Secondary | ICD-10-CM

## 2017-03-11 DIAGNOSIS — M25519 Pain in unspecified shoulder: Secondary | ICD-10-CM | POA: Diagnosis present

## 2017-03-11 DIAGNOSIS — S72492A Other fracture of lower end of left femur, initial encounter for closed fracture: Secondary | ICD-10-CM

## 2017-03-11 DIAGNOSIS — R17 Unspecified jaundice: Secondary | ICD-10-CM

## 2017-03-11 DIAGNOSIS — F0390 Unspecified dementia without behavioral disturbance: Secondary | ICD-10-CM

## 2017-03-11 DIAGNOSIS — I639 Cerebral infarction, unspecified: Secondary | ICD-10-CM

## 2017-03-11 DIAGNOSIS — I959 Hypotension, unspecified: Secondary | ICD-10-CM

## 2017-03-11 DIAGNOSIS — M25512 Pain in left shoulder: Secondary | ICD-10-CM

## 2017-03-11 DIAGNOSIS — J9601 Acute respiratory failure with hypoxia: Secondary | ICD-10-CM

## 2017-03-11 DIAGNOSIS — N179 Acute kidney failure, unspecified: Secondary | ICD-10-CM

## 2017-03-11 DIAGNOSIS — E119 Type 2 diabetes mellitus without complications: Secondary | ICD-10-CM

## 2017-03-11 DIAGNOSIS — N184 Chronic kidney disease, stage 4 (severe): Secondary | ICD-10-CM

## 2017-03-11 DIAGNOSIS — I5042 Chronic combined systolic (congestive) and diastolic (congestive) heart failure: Secondary | ICD-10-CM

## 2017-03-11 LAB — CBC
HCT: 27.7 % — ABNORMAL LOW (ref 36.0–46.0)
HCT: 26.8 % — ABNORMAL LOW (ref 36.0–46.0)
HEMATOCRIT: 26.2 % — AB (ref 36.0–46.0)
HEMOGLOBIN: 8.9 g/dL — AB (ref 12.0–15.0)
HEMOGLOBIN: 8.8 g/dL — AB (ref 12.0–15.0)
Hemoglobin: 8.2 g/dL — ABNORMAL LOW (ref 12.0–15.0)
MCH: 33.2 pg (ref 26.0–34.0)
MCH: 34.4 pg — ABNORMAL HIGH (ref 26.0–34.0)
MCH: 35.2 pg — ABNORMAL HIGH (ref 26.0–34.0)
MCHC: 31.3 g/dL (ref 30.0–36.0)
MCHC: 32.1 g/dL (ref 30.0–36.0)
MCHC: 32.8 g/dL (ref 30.0–36.0)
MCV: 106.1 fL — ABNORMAL HIGH (ref 78.0–100.0)
MCV: 106.9 fL — ABNORMAL HIGH (ref 78.0–100.0)
MCV: 107.2 fL — ABNORMAL HIGH (ref 78.0–100.0)
PLATELETS: 117 10*3/uL — AB (ref 150–400)
Platelets: 121 10*3/uL — ABNORMAL LOW (ref 150–400)
Platelets: 142 10*3/uL — ABNORMAL LOW (ref 150–400)
RBC: 2.47 MIL/uL — ABNORMAL LOW (ref 3.87–5.11)
RBC: 2.59 MIL/uL — AB (ref 3.87–5.11)
RBC: 2.5 MIL/uL — AB (ref 3.87–5.11)
RDW: 23 % — AB (ref 11.5–15.5)
RDW: 23.2 % — ABNORMAL HIGH (ref 11.5–15.5)
RDW: 23.3 % — ABNORMAL HIGH (ref 11.5–15.5)
WBC: 8.1 10*3/uL (ref 4.0–10.5)
WBC: 7.4 10*3/uL (ref 4.0–10.5)
WBC: 9.8 10*3/uL (ref 4.0–10.5)

## 2017-03-11 LAB — COMPREHENSIVE METABOLIC PANEL
ALT: 35 U/L (ref 14–54)
ANION GAP: 16 — AB (ref 5–15)
AST: 56 U/L — AB (ref 15–41)
Albumin: 3.1 g/dL — ABNORMAL LOW (ref 3.5–5.0)
Alkaline Phosphatase: 120 U/L (ref 38–126)
BUN: 58 mg/dL — ABNORMAL HIGH (ref 6–20)
CO2: 21 mmol/L — ABNORMAL LOW (ref 22–32)
Calcium: 8.7 mg/dL — ABNORMAL LOW (ref 8.9–10.3)
Chloride: 103 mmol/L (ref 101–111)
Creatinine, Ser: 2.89 mg/dL — ABNORMAL HIGH (ref 0.44–1.00)
GFR calc Af Amer: 16 mL/min — ABNORMAL LOW (ref >60)
GFR, EST NON AFRICAN AMERICAN: 14 mL/min — AB (ref >60)
Glucose, Bld: 83 mg/dL (ref 65–99)
POTASSIUM: 5.5 mmol/L — AB (ref 3.5–5.1)
Sodium: 140 mmol/L (ref 135–145)
TOTAL PROTEIN: 5.5 g/dL — AB (ref 6.5–8.1)
Total Bilirubin: 5.4 mg/dL — ABNORMAL HIGH (ref 0.3–1.2)

## 2017-03-11 LAB — LIPASE, BLOOD: LIPASE: 20 U/L (ref 11–51)

## 2017-03-11 LAB — PROTIME-INR
INR: 1.51
INR: 1.55
INR: 1.57
PROTHROMBIN TIME: 18.3 s — AB (ref 11.4–15.2)
PROTHROMBIN TIME: 18.8 s — AB (ref 11.4–15.2)
Prothrombin Time: 18.9 seconds — ABNORMAL HIGH (ref 11.4–15.2)

## 2017-03-11 LAB — LACTIC ACID, PLASMA: LACTIC ACID, VENOUS: 3.4 mmol/L — AB (ref 0.5–1.9)

## 2017-03-11 LAB — GLUCOSE, CAPILLARY
GLUCOSE-CAPILLARY: 113 mg/dL — AB (ref 65–99)
GLUCOSE-CAPILLARY: 131 mg/dL — AB (ref 65–99)
GLUCOSE-CAPILLARY: 45 mg/dL — AB (ref 65–99)
GLUCOSE-CAPILLARY: 97 mg/dL (ref 65–99)
Glucose-Capillary: 128 mg/dL — ABNORMAL HIGH (ref 65–99)
Glucose-Capillary: 95 mg/dL (ref 65–99)
Glucose-Capillary: 88 mg/dL (ref 65–99)
Glucose-Capillary: 91 mg/dL (ref 65–99)

## 2017-03-11 LAB — BLOOD GAS, ARTERIAL
Acid-base deficit: 0.4 mmol/L (ref 0.0–2.0)
Bicarbonate: 24 mmol/L (ref 20.0–28.0)
DRAWN BY: 448981
O2 Content: 4 L/min
O2 Saturation: 98.7 %
PCO2 ART: 39.6 mmHg (ref 32.0–48.0)
PH ART: 7.396 (ref 7.350–7.450)
Patient temperature: 97.5
pO2, Arterial: 119 mmHg — ABNORMAL HIGH (ref 83.0–108.0)

## 2017-03-11 LAB — BRAIN NATRIURETIC PEPTIDE: B NATRIURETIC PEPTIDE 5: 832.6 pg/mL — AB (ref 0.0–100.0)

## 2017-03-11 LAB — BASIC METABOLIC PANEL
Anion gap: 13 (ref 5–15)
BUN: 58 mg/dL — AB (ref 6–20)
CALCIUM: 8.8 mg/dL — AB (ref 8.9–10.3)
CHLORIDE: 103 mmol/L (ref 101–111)
CO2: 23 mmol/L (ref 22–32)
CREATININE: 2.78 mg/dL — AB (ref 0.44–1.00)
GFR calc Af Amer: 17 mL/min — ABNORMAL LOW (ref >60)
GFR calc non Af Amer: 14 mL/min — ABNORMAL LOW (ref >60)
Glucose, Bld: 110 mg/dL — ABNORMAL HIGH (ref 65–99)
Potassium: 5.6 mmol/L — ABNORMAL HIGH (ref 3.5–5.1)
Sodium: 139 mmol/L (ref 135–145)

## 2017-03-11 LAB — C-REACTIVE PROTEIN: CRP: 8.7 mg/dL — AB (ref <1.0)

## 2017-03-11 LAB — SURGICAL PCR SCREEN
MRSA, PCR: NEGATIVE
STAPHYLOCOCCUS AUREUS: NEGATIVE

## 2017-03-11 LAB — LIPID PANEL
CHOL/HDL RATIO: 4 ratio
Cholesterol: 115 mg/dL (ref 0–200)
HDL: 29 mg/dL — AB (ref >40)
LDL CALC: 66 mg/dL (ref 0–99)
Triglycerides: 98 mg/dL (ref <150)
VLDL: 20 mg/dL (ref 0–40)

## 2017-03-11 LAB — TYPE AND SCREEN
ABO/RH(D): A NEG
ANTIBODY SCREEN: NEGATIVE

## 2017-03-11 LAB — SEDIMENTATION RATE: SED RATE: 12 mm/h (ref 0–22)

## 2017-03-11 LAB — ABO/RH: ABO/RH(D): A NEG

## 2017-03-11 MED ORDER — INSULIN ASPART 100 UNIT/ML ~~LOC~~ SOLN: 0.0000 [IU] | SUBCUTANEOUS | Status: DC

## 2017-03-11 MED ORDER — SODIUM CHLORIDE 0.9 % IV SOLN
Freq: Once | INTRAVENOUS | Status: AC
  Administered 2017-03-11: 20:00:00 via INTRAVENOUS

## 2017-03-11 MED ORDER — DEXTROSE 50 % IV SOLN
INTRAVENOUS | Status: AC
  Administered 2017-03-11: 25 mL
  Filled 2017-03-11: qty 50

## 2017-03-11 MED ORDER — LEVOTHYROXINE SODIUM 100 MCG IV SOLR
12.5000 ug | Freq: Every day | INTRAVENOUS | Status: DC
  Administered 2017-03-11 – 2017-03-12 (×2): 12.5 ug via INTRAVENOUS
  Filled 2017-03-11 (×2): qty 5

## 2017-03-11 MED ORDER — DEXTROSE-NACL 5-0.9 % IV SOLN
INTRAVENOUS | Status: DC
  Administered 2017-03-11: 20:00:00 via INTRAVENOUS

## 2017-03-11 MED ORDER — HEPARIN SODIUM (PORCINE) 5000 UNIT/ML IJ SOLN
5000.0000 [IU] | Freq: Three times a day (TID) | INTRAMUSCULAR | Status: DC
  Administered 2017-03-12 (×3): 5000 [IU] via SUBCUTANEOUS
  Filled 2017-03-11 (×3): qty 1

## 2017-03-11 MED ORDER — PNEUMOCOCCAL VAC POLYVALENT 25 MCG/0.5ML IJ INJ
0.5000 mL | INJECTION | INTRAMUSCULAR | Status: AC
  Administered 2017-03-12: 0.5 mL via INTRAMUSCULAR
  Filled 2017-03-11: qty 0.5

## 2017-03-11 MED ORDER — MORPHINE SULFATE (PF) 2 MG/ML IV SOLN
1.0000 mg | Freq: Once | INTRAVENOUS | Status: AC
  Administered 2017-03-11: 1 mg via INTRAVENOUS
  Filled 2017-03-11: qty 1

## 2017-03-11 MED ORDER — ORAL CARE MOUTH RINSE
15.0000 mL | Freq: Two times a day (BID) | OROMUCOSAL | Status: DC
  Administered 2017-03-11 – 2017-03-21 (×13): 15 mL via OROMUCOSAL

## 2017-03-11 NOTE — Consult Note (Signed)
ORTHOPAEDIC CONSULTATION  REQUESTING PHYSICIAN: Karmen Bongo, MD  Chief Complaint: Left femur fracture  HPI: Glenda Dickson is a 83 y.o. female who is demented at baseline with new finding of a L Wiley femur fracture. She has an old hip nail above. She is being worked up for possible new stroke.   Past Medical History:  Diagnosis Date  . Anemia, normocytic normochromic 06/26/2012   H&H-11.2/33.7, normal MCV in 06/2012   . Atrial fibrillation (HCC)    on anticoagulation  . CHF (congestive heart failure) ([place])   . Dementia 10/2016   Aricept did not help, mood stabilizer did  . Depression   . Diabetes mellitus type II    no insulin  . Fracture of left hip ([place]) 2006   intertrochanteric; ORIF - repaired by [facility]  . Hyperlipidemia   . Hypertension    Past Surgical History:  Procedure Laterality Date  . ABDOMINAL HYSTERECTOMY    . COLONOSCOPY  2003   Negative screening study.  Clista Bernhardt HIP FRACTURE SURGERY     Fixation   Social History   Social History  . Marital status: Widowed    Spouse name: N/A  . Number of children: N/A  . Years of education: N/A   Occupational History  . retired    Social History Main Topics  . Smoking status: Never Smoker  . Smokeless tobacco: Never Used  . Alcohol use No  . Drug use: No  . Sexual activity: Not Currently   Other Topics Concern  . None   Social History Narrative  . None   Family History  Problem Relation Age of Onset  . Hypertension Mother   . Heart failure Mother   . Heart attack Father   . Hypertension Father   . Hypertension Sister        Atrial Fib  . Hypertension Brother        4/6 brothers with htn   Allergies  Allergen Reactions  . Benzalkonium Chloride-Alcohol Other (See Comments)    Patient/family is not familiar with this allergy  . Codeine Itching and Nausea Only   Prior to Admission medications   Medication Sig Start Date End Date Taking? Authorizing Provider    acetaminophen (TYLENOL) 325 MG tablet Take 650 mg by mouth every 6 (six) hours as needed for mild pain or moderate pain.    Yes [provider]  atorvastatin (LIPITOR) 40 MG tablet Take 40 mg by mouth every evening.    Yes [provider]  Calcium Citrate-Vitamin D 315-250 MG-UNIT TABS Take 1 tablet by mouth daily.   Yes [provider]  ferrous sulfate 325 (65 FE) MG tablet Take 325 mg by mouth daily with breakfast.  06/29/16  Yes [provider]  furosemide (LASIX) 20 MG tablet Take 20 mg by mouth daily.   Yes [provider]  glipiZIDE (GLUCOTROL XL) 2.5 MG 24 hr tablet Take 2.5 mg by mouth daily with breakfast.    Yes [provider]  metoprolol tartrate (LOPRESSOR) 50 MG tablet Take 1 tablet by mouth 2 (two) times daily. 01/13/17  Yes [provider]  Multiple Vitamin (MULTIVITAMIN) tablet Take 1 tablet by mouth daily.   Yes [provider]  nystatin (MYCOSTATIN/NYSTOP) powder Apply topically 3 (three) times daily as needed.   Yes [provider]  PARoxetine (PAXIL) 20 MG tablet Take 20 mg by mouth daily. 01/13/17  Yes [provider]  potassium chloride (KLOR-CON) 20 MEQ packet  Take 20 mEq by mouth daily.   Yes [provider]  QUEtiapine (SEROQUEL) 50 MG tablet Take 1 tablet by mouth 2 (two) times daily. 02/12/17  Yes [provider]  ramipril (ALTACE) 10 MG capsule Take 1 capsule (10 mg total) by mouth daily. 01/16/17  Yes Herminio Commons, MD  traZODone (DESYREL) 150 MG tablet Take 150 mg by mouth at bedtime as needed for sleep.   Yes [provider]  warfarin (COUMADIN) 2.5 MG tablet Take 2.5 mg by mouth daily at 6 PM. Take 2 tablet tonight (6/11) then 1.5 tablet daily except on Sunday, Tuesday and Thursday, take 1 tablet   Yes [provider]   Dg Chest 1 View  Result Date: 03/10/2017 CLINICAL DATA:  83 year old with multiple recent falls, presenting with  jaundice, slurred speech and shortness of breath. Current history of hypertension, diabetes, chronic diastolic CHF. EXAM: CHEST 1 VIEW COMPARISON:  01/14/2017, 10/23/2016 and earlier. FINDINGS: Cardiac silhouette moderately to markedly enlarged, unchanged. Pulmonary vascularity normal without evidence of pulmonary edema. Chronic dense consolidation in the left lower lobe and associated left pleural effusion, unchanged dating back to January, 2018. Lungs otherwise clear. No visible right pleural effusion. Degenerative changes again noted involving the right shoulder. IMPRESSION: Chronic left lower lobe atelectasis and/or pneumonia and left pleural effusion. Stable cardiomegaly. No new abnormalities since 04/24 1,018. Electronically Signed   By: Evangeline Dakin M.D.   On: 03/10/2017 15:08   Ct Head Wo Contrast  Result Date: 03/10/2017 CLINICAL DATA:  Several recent falls.  Slurred speech. EXAM: CT HEAD WITHOUT CONTRAST TECHNIQUE: Contiguous axial images were obtained from the base of the skull through the vertex without intravenous contrast. COMPARISON:  Head CT October 23, 2016 and brain MRI October 23, 2016 FINDINGS: Brain: There is moderate diffuse atrophy. There is a focal infarct in the inferior right cerebellar hemisphere with cytotoxic edema in the inferior to mid right cerebellar hemisphere, not present on prior studies. There is no intracranial mass, hemorrhage, extra-axial fluid collection, or midline shift. There is mild small vessel disease in the centra semiovale bilaterally. No other recent/acute appearing infarct is appreciable on this study. Vascular: There is no appreciable hyperdense vessel. There is calcification in each carotid siphon as well as in each distal vertebral artery. Skull: The bony calvarium appears intact. Sinuses/Orbits: There is mucosal thickening in several ethmoid air cells bilaterally. There is diffuse opacification in the right sphenoid sinus. Other paranasal sinuses are  clear. Orbits appear symmetric bilaterally. Other: Mastoid air cells are clear. IMPRESSION: Recent appearing infarct in the inferior right cerebellar hemisphere. Suspect infarct involving a portion of the right posterior inferior cerebral artery distribution. No other acute appearing infarct evident. There is underlying atrophy with mild periventricular small vessel disease. There is no mass, hemorrhage, or extra-axial fluid collection. There are foci of arterial calcification in the anterior and posterior circulations. There is paranasal sinus disease, most marked in the right sphenoid sinus region. Electronically Signed   By: Lowella Grip III M.D.   On: 03/10/2017 14:08   Dg Hip Unilat W Or Wo Pelvis 2-3 Views Left  Result Date: 03/10/2017 CLINICAL DATA:  Pain.  Multiple recent falls EXAM: DG HIP (WITH OR WITHOUT PELVIS) 2-3V LEFT COMPARISON:  None. FINDINGS: Frontal pelvis as well as frontal and lateral left hip images or obtained. There is postoperative change in the proximal left femur. Tip of screw is in the proximal femoral head. There is myositis ossificans dorsal to the greater tuberosity. There  is mild symmetric narrowing of both hip joints. There is no acute fracture or dislocation. There is extensive aortoiliac and bilateral common and superficial femoral artery atherosclerosis. IMPRESSION: No acute fracture or dislocation. Mild narrowing of both hip joints. Myositis ossifications superior to the left greater trochanter. Postoperative change on the left. Extensive aortoiliac and femoral artery atherosclerosis. Electronically Signed   By: Lowella Grip III M.D.   On: 03/10/2017 14:43   Dg Femur Min 2 Views Left  Result Date: 03/10/2017 CLINICAL DATA:  Pain following recent fall EXAM: LEFT FEMUR 2 VIEWS COMPARISON:  None. FINDINGS: Frontal and lateral views were obtained. There is a comminuted fracture of the distal femoral metaphysis with posterior displacement of distal major fracture  fragment with respect to proximal major fracture fragment. More proximally, there is no acute fracture or dislocation. There is evidence of old trauma with postoperative fixation. There is myositis ossificans superior to the greater trochanter. No dislocation. There is a joint effusion. There is extensive vascular atherosclerosis. IMPRESSION: Comminuted fracture distal femoral metaphysis with posterior displacement of distal major fracture fragment with respect to proximal major fragment. Advanced arthropathy in the knee joint without frank dislocation. There is a moderate joint effusion. Postoperative change proximally without acute fracture or dislocation proximally. There is extensive femoral artery atherosclerosis. Electronically Signed   By: Lowella Grip III M.D.   On: 03/10/2017 14:45    Positive ROS: All other systems have been reviewed and were otherwise negative with the exception of those mentioned in the HPI and as above.  Labs cbc  Recent Labs  03/10/17 2316 03/11/17 0543  WBC 7.5 8.1  HGB 8.6* 8.2*  HCT 27.0* 26.2*  PLT 130* PENDING    Labs inflam No results for input(s): CRP in the last 72 hours.  Invalid input(s): ESR  Labs coag  Recent Labs  03/10/17 2316 03/11/17 0543  INR 1.39 1.51     Recent Labs  03/10/17 1509  NA 138  K 5.3*  CL 99*  CO2 26  GLUCOSE 160*  BUN 55*  CREATININE 2.41*  CALCIUM 9.1    Physical Exam: Vitals:   03/11/17 0500 03/11/17 0600  BP: 99/78 124/84  Pulse:  (!) 55  Resp: 16 19  Temp:     General: Alert, no acute distress Cardiovascular: No pedal edema Respiratory: No cyanosis, no use of accessory musculature GI: No organomegaly, abdomen is soft and non-tender Skin: No lesions in the area of chief complaint other than those listed below in MSK exam.  Neurologic: limited exam by dimensia Psychiatric: Patient is demented Lymphatic: No axillary or cervical lymphadenopathy  MUSCULOSKELETAL:  LLE: skin benign, moves  toes spontaneously, foot is WWP Other extremities are atraumatic with painless ROM and NVI.  Assessment: Left femur fracture  Plan: I had a long talk with her daughter. She is unsure if she wants patient to proceed with surgery given the very high risk and minimal ambulatory status I have asked Dr. Marcelino Scot to eval the patient as well and he will do so this am.       Renette Butters, MD Cell [phone]   03/11/2017 7:43 AM

## 2017-03-11 NOTE — Progress Notes (Signed)
Still no MRI despite STAT order. Running out of schedule availability today and anticipate that Thurs would be next possible window if surgery chosen. Given limited function, I still favor nonsurgical management but willing to proceed with fixation if reasonable chance of ambulatory recovery after this neurologic event.  Myrene GalasMichael Kizzie Cotten, MD [facility] [phone] (p)

## 2017-03-11 NOTE — Progress Notes (Addendum)
Initial Nutrition Assessment  DOCUMENTATION CODES:   Obesity unspecified  INTERVENTION:    Await swallow evaluation/SLP recommendations.  Add nutrition interventions accordingly.  NUTRITION DIAGNOSIS:   Increased nutrient needs related to  (hip fracture) as evidenced by estimated needs  GOAL:   Patient will meet greater than or equal to 90% of their needs  MONITOR:   Diet advancement, PO intake, Labs, Weight trends, Skin, I & O's  REASON FOR ASSESSMENT:   Consult Hip fracture protocol  ASSESSMENT:   83 y.o. Female with medical history significant of HTN, HLD, remote left hip fracture, DM, Dementia/Depression, CHF (EF 25-30%, abnormal diastolic function of indeterminant grade in 5/18), Anemia (baseline appears to be 11-12), and afib on Coumadin presenting with leg pain after several falls.   Pt found to have femur fracture and R cerebellar infarct.  [facility] [place], [place]. Spoke with pt's son-in-law at bedside.  Reports no nutrition problems PTA.   Pt NPO for possible surgery. Swallow evaluation pending. Labs and medications reviewed.  K 5.6 (H). CBG's [id number].  Unable to complete Nutrition-Focused physical exam at this time.  Pt yelling out in pain.  Diet Order:  Diet NPO time specified  Skin:  Reviewed, no issues  Last BM:  6/17  Height:   Ht Readings from Last 1 Encounters:  03/10/17 5\' 5"  (1.651 m)    Weight:   Wt Readings from Last 1 Encounters:  03/10/17 204 lb 9.4 oz (92.8 kg)    Ideal Body Weight:  56.8 kg  BMI:  Body mass index is 34.05 kg/m.  Estimated Nutritional Needs:   Kcal:  1400-1600  Protein:  85-95 gm  Fluid:  >/= 1.5 L  EDUCATION NEEDS:   No education needs identified at this time  Maureen ChattersKatie Araiyah Cumpton, RD, LDN Pager #: [phone] After-Hours Pager #: [phone]

## 2017-03-11 NOTE — Progress Notes (Signed)
PROGRESS NOTE    Glenda Dickson  ZOX:[id number] DOB: 01/15/1928 DOA: 03/10/2017 PCP: Benita Stabile, MD   Brief Narrative:  Presented to [facility] with reports of leg pain after several falls beginning this past Saturday. She was very confused and lethargic at time of presentation. Family reported increased retention of fluid and yellow discoloration to her skin. Patient's roommate reported to daughter that the patient had been complaining of a headache several days. Evaluation in the ER revealed a left cerebellar stroke on CT. X-ray of pelvis and left hip demonstrated no acute fracture of dislocation of prior femur screw lower femur imaging on the left did reveal a comminuted fracture of the distal femoral metaphysis with posterior displacement of distal major fracture fragment with respect to proximal major fragment. Because of acute stroke findings patient was subsequently transferred to [facility] for further evaluation and treatment. In addition to above, patient was found to be jaundiced in appearance with a total bilirubin of 4.4 without any apparent abdominal pain and only mild elevation in AST. She also had acute kidney injury superimposed on stage IV chronic kidney disease. She has been persistently but mildly hyperkalemic. Patient's INR was therapeutic at presentation and in anticipation of possible urgent orthopedic surgery her INR was reversed with Theodoro Parma.  After arrival to come on she has been evaluated by the neurological team with MRI/MRA pending. She's also been evaluated by Dr. Carola Frost with orthopedics with likely surgical availability on 6/20 although nonsurgical management acutely is favored given acute neurological findings as established above. On exam today patient demonstrated possible left shoulder pain on exam. She has had decreased urinary output without any improvement in her renal function. A routine TSH obtained at time of admission was markedly elevated at  171.577.   Assessment & Plan:   Principal Problem:   Cerebellar infarct  -Demonstrated on initial CT and likely occurred 36-48 hours prior to presentation noting patient apparently reported to roommate headache recently -Appreciate neurological team assistance -MRI/MRA pending -TTE completed on 5/18 therefore neurology has recommended to not repeat. If stroke verified on MRI patient will need TEE -Carotid Dopplers -PT/OT evaluation as well as SLP evaluation -Frequent neurological checks  Active Problems:   Combined congestive systolic and diastolic heart failure  -In January 2018 patient had preserved LV systolic function with evidence of diastolic dysfunction -Recent echocardiogram 5/18  with reduced ejection fraction 25-30% with associated diastolic dysfunction, mild to moderate RV dilatation with mild reduction in RV systolic function, moderate pulmonary hypertension 50 mmHg and moderate to severe TR -Unable to obtain accurate pulse oximetry on patient -She is currently +1.3 L -Worsening renal function may be related to acute systolic dysfunction -Check chest x-ray -NPO secondary to neurological status so oral medications including Lasix and ACE inhibitor/beta blocker had been held and patient was given IV fluids-KVO IV fluids for now -Stat BNP -Current blood pressure suboptimal therefore use caution with diuresis-hold antihypertensive medications for now -Daily weights and strict I/O -ABG just obtained on 2 L nasal cannula oxygen: Normal pH, normal PCO2, PO2 119, normal bicarbonate, O2 saturation 98.7%    Hyperkalemia 2/2 Acute renal failure superimposed on stage 4 chronic kidney disease  -Baseline renal function: 30 and 1.68 -Current renal function: 58 and 2.78 -Repeat electrolytes now especially regarding persistent mild hyperkalemia -Uncertain if 2/2 dehydration secondary to events that preceded admission vs 2/2 low perfusion in setting of acute systolic dysfunction -Obtain  bladder scan in the event patient is not emptying urine  appropriately therefore causing AKI -Hypokalemia could be related to hyperbilirubinemia as well    Borderline hypotension -Etiology unclear -Does not appear to be septic but for completeness of evaluation will obtain blood cultures, C-reactive protein and lactic acid    Hyperbilirubinemia -Review of labs revealed patient had mildly elevated total bilirubin of 2.0 in January with otherwise normal LFTs -Current bilirubin is 4.4 with an AST of 42 -Patient is quite jaundiced without any abdominal pain and this is concerning for possible underlying malignancy -Abdominal ultrasound -Lipase -Follow LFTs -Med list reviewed and no offending medications other than Tylenol -Daughter confirms has never been on amiodarone or Pacerone for atrial fibrillation    Hypothyroidism -New diagnosis -Markedly elevated TSH and this certainly could be contributing to patient's altered mentation -Family reports for several months patient has been complaining of feeling cold -Check free T4 and T3 -Begin low-dose Synthroid 12.5 g IV every day    Hypertension -Blood pressure currently suboptimal -Hold preadmission beta blocker and ACE inhibitor    Diabetes mellitus type II, non insulin dependent  -Currently not hyperglycemic -In setting of acute kidney injury hold glipizide -SSI -HgbA1c    Atrial fibrillation  -Currently rate controlled -CHADVASc= 8 -Resume full dose anticoagulation as soon as reasonably safe-need to clarify regarding possible surgical procedure before resuming (see below) -Holding beta blocker as above    Closed comminuted intra-articular fracture of distal femur, left, initial encounter  -Appreciate orthopedic /Dr. Carola Frost assistance -Patient minimally ambulatory prior to arrival and current plan is nonsurgical management with consideration of proceeding with fixation if reasonable chance of ambulatory recovery after acute  neurological event as described above -Continue left knee immobilizer -Given AKI unable to utilize NSAIDs for pain therefore will need to utilize IV muscle relaxants and morphine but used cautiously given need for appropriate neurological evaluation    Dementia -On Seroquel and Paxil prior to admission as well as trazodone    Acute pain of left shoulder -Patient fell recently and has sustained a femur fracture -Patient appears to be guarding the shoulder when attempts are made to check for muscle tone and resistance of left arm therefore concerned about possible traumatic injury -Obtain left shoulder x-ray when medically stable    Hyperlipidemia -NPO -Statin on hold    Anemia, normocytic normochromic -Hemoglobin stable around baseline of 8.8 -May be dilutional in nature and if does require diuresis hemoglobin could improve    DVT prophylaxis: Subcutaneous heparin Code Status: Full Family Communication: Spoke extensively with patient's daughter and son at the bedside Disposition Plan: SNF   Consultants:   Neurology/Lindzen  Orthopedics/Hnady  Procedures:   None  Antimicrobials:   None   Subjective: Patient nonverbal but will not appropriately to questions asked although she is quite lethargic  Objective: Vitals:   03/11/17 1300 03/11/17 1400 03/11/17 1613 03/11/17 1628  BP: 101/80 (!) 115/99 (!) 83/55 94/76  Pulse:   82   Resp: 13 (!) 23 18 14   Temp:      TempSrc:      SpO2:   (!) 71%   Weight:      Height:        Intake/Output Summary (Last 24 hours) at 03/11/17 1757 Last data filed at 03/11/17 1600  Gross per 24 hour  Intake             1705 ml  Output                0 ml  Net  1705 ml   Filed Weights   03/10/17 2000 03/10/17 2016 03/10/17 2256  Weight: 88 kg (194 lb) 88 kg (194 lb) 92.8 kg (204 lb 9.4 oz)    Examination:  General exam: Lethargic but opens eyes to voice and tactile stimulation-appears acutely ill Respiratory  system: Clear to auscultation bilaterally with lung sounds diminished in the bases. No tachypnea or increased work of breathing. 2 L oxygen Cardiovascular system: S1 & S2 heard, irregular with underlying atrial fibrillation. No murmurs, rubs, gallops or clicks. 1-2+ bilateral lower extremity edema Gastrointestinal system: Abdomen is nondistended, soft and nontender. No organomegaly or masses felt. Normal bowel sounds heard. Genitourinary system: Incontinent of urine was wicking device in place Central nervous system: Lethargic but opens eyes to voice and tactile stimulation. Nods appropriately to questions asked. She was able to stick out her tongue on command. Squeezes with right hand but not with left. Left upper extremity strength and use appears to be limited secondary to left shoulder pain. Limited movement of right leg on command. Unable to move left leg secondary to knee mobilizer in place. DTRs were not assessed. Unable to accurately assess strength due to patient's inability to participate but overall right side strength appears to be 3/5. Extremities: Symmetrical in appearance without any joint effusions or redness Skin: No rashes, lesions or ulcers Psychiatry: Patient is lethargic and nonverbal    Data Reviewed: I have personally reviewed following labs and imaging studies  CBC:  Recent Labs Lab 03/10/17 1509 03/10/17 2316 03/11/17 0543 03/11/17 1028 03/11/17 1633  WBC 6.5 7.5 8.1 7.4 9.8  NEUTROABS 4.7  --   --   --   --   HGB 8.3* 8.6* 8.2* 8.9* 8.8*  HCT 25.0* 27.0* 26.2* 27.7* 26.8*  MCV 105.9* 107.6* 106.1* 106.9* 107.2*  PLT 105* 130* 117* 121* 142*   Basic Metabolic Panel:  Recent Labs Lab 03/10/17 1509 03/11/17 1028  NA 138 139  K 5.3* 5.6*  CL 99* 103  CO2 26 23  GLUCOSE 160* 110*  BUN 55* 58*  CREATININE 2.41* 2.78*  CALCIUM 9.1 8.8*   GFR: Estimated Creatinine Clearance: 16.4 mL/min (A) (by C-G formula based on SCr of 2.78 mg/dL (H)). Liver Function  Tests:  Recent Labs Lab 03/10/17 1509  AST 42*  ALT 29  ALKPHOS 124  BILITOT 4.4*  PROT 5.6*  ALBUMIN 3.4*   No results for input(s): LIPASE, AMYLASE in the last 168 hours. No results for input(s): AMMONIA in the last 168 hours. Coagulation Profile:  Recent Labs Lab 03/10/17 1509 03/10/17 2316 03/11/17 0543 03/11/17 1147  INR 2.13 1.39 1.51 1.55   Cardiac Enzymes: No results for input(s): CKTOTAL, CKMB, CKMBINDEX, TROPONINI in the last 168 hours. BNP (last 3 results) No results for input(s): PROBNP in the last 8760 hours. HbA1C: No results for input(s): HGBA1C in the last 72 hours. CBG:  Recent Labs Lab 03/11/17 0005 03/11/17 0311 03/11/17 0807 03/11/17 1137 03/11/17 1612  GLUCAP 113* 131* 128* 95 88   Lipid Profile:  Recent Labs  03/11/17 0543  CHOL 115  HDL 29*  LDLCALC 66  TRIG 98  CHOLHDL 4.0   Thyroid Function Tests:  Recent Labs  03/10/17 1509  TSH 171.577*   Anemia Panel: No results for input(s): VITAMINB12, FOLATE, FERRITIN, TIBC, IRON, RETICCTPCT in the last 72 hours. Sepsis Labs: No results for input(s): PROCALCITON, LATICACIDVEN in the last 168 hours.  Recent Results (from the past 240 hour(s))  Surgical pcr screen  Status: None   Collection Time: 03/10/17 11:18 PM  Result Value Ref Range Status   MRSA, PCR NEGATIVE NEGATIVE Final   Staphylococcus aureus NEGATIVE NEGATIVE Final    Comment:        The Xpert SA Assay (FDA approved for NASAL specimens in patients over 81 years of age), is one component of a comprehensive surveillance program.  Test performance has been validated by [facility] for patients greater than or equal to 52100 year old. It is not intended to diagnose infection nor to guide or monitor treatment.          Radiology Studies: Dg Chest 1 View  Result Date: 03/10/2017 CLINICAL DATA:  83 year old with multiple recent falls, presenting with jaundice, slurred speech and shortness of breath. Current  history of hypertension, diabetes, chronic diastolic CHF. EXAM: CHEST 1 VIEW COMPARISON:  01/14/2017, 10/23/2016 and earlier. FINDINGS: Cardiac silhouette moderately to markedly enlarged, unchanged. Pulmonary vascularity normal without evidence of pulmonary edema. Chronic dense consolidation in the left lower lobe and associated left pleural effusion, unchanged dating back to January, 2018. Lungs otherwise clear. No visible right pleural effusion. Degenerative changes again noted involving the right shoulder. IMPRESSION: Chronic left lower lobe atelectasis and/or pneumonia and left pleural effusion. Stable cardiomegaly. No new abnormalities since 04/24 1,018. Electronically Signed   By: Hulan Saashomas  Lawrence M.D.   On: 03/10/2017 15:08   Ct Head Wo Contrast  Result Date: 03/10/2017 CLINICAL DATA:  Several recent falls.  Slurred speech. EXAM: CT HEAD WITHOUT CONTRAST TECHNIQUE: Contiguous axial images were obtained from the base of the skull through the vertex without intravenous contrast. COMPARISON:  Head CT October 23, 2016 and brain MRI October 23, 2016 FINDINGS: Brain: There is moderate diffuse atrophy. There is a focal infarct in the inferior right cerebellar hemisphere with cytotoxic edema in the inferior to mid right cerebellar hemisphere, not present on prior studies. There is no intracranial mass, hemorrhage, extra-axial fluid collection, or midline shift. There is mild small vessel disease in the centra semiovale bilaterally. No other recent/acute appearing infarct is appreciable on this study. Vascular: There is no appreciable hyperdense vessel. There is calcification in each carotid siphon as well as in each distal vertebral artery. Skull: The bony calvarium appears intact. Sinuses/Orbits: There is mucosal thickening in several ethmoid air cells bilaterally. There is diffuse opacification in the right sphenoid sinus. Other paranasal sinuses are clear. Orbits appear symmetric bilaterally. Other: Mastoid  air cells are clear. IMPRESSION: Recent appearing infarct in the inferior right cerebellar hemisphere. Suspect infarct involving a portion of the right posterior inferior cerebral artery distribution. No other acute appearing infarct evident. There is underlying atrophy with mild periventricular small vessel disease. There is no mass, hemorrhage, or extra-axial fluid collection. There are foci of arterial calcification in the anterior and posterior circulations. There is paranasal sinus disease, most marked in the right sphenoid sinus region. Electronically Signed   By: Bretta BangWilliam  Woodruff III M.D.   On: 03/10/2017 14:08   Dg Hip Unilat W Or Wo Pelvis 2-3 Views Left  Result Date: 03/10/2017 CLINICAL DATA:  Pain.  Multiple recent falls EXAM: DG HIP (WITH OR WITHOUT PELVIS) 2-3V LEFT COMPARISON:  None. FINDINGS: Frontal pelvis as well as frontal and lateral left hip images or obtained. There is postoperative change in the proximal left femur. Tip of screw is in the proximal femoral head. There is myositis ossificans dorsal to the greater tuberosity. There is mild symmetric narrowing of both hip joints. There is no acute  fracture or dislocation. There is extensive aortoiliac and bilateral common and superficial femoral artery atherosclerosis. IMPRESSION: No acute fracture or dislocation. Mild narrowing of both hip joints. Myositis ossifications superior to the left greater trochanter. Postoperative change on the left. Extensive aortoiliac and femoral artery atherosclerosis. Electronically Signed   By: Bretta Bang III M.D.   On: 03/10/2017 14:43   Dg Femur Min 2 Views Left  Result Date: 03/10/2017 CLINICAL DATA:  Pain following recent fall EXAM: LEFT FEMUR 2 VIEWS COMPARISON:  None. FINDINGS: Frontal and lateral views were obtained. There is a comminuted fracture of the distal femoral metaphysis with posterior displacement of distal major fracture fragment with respect to proximal major fracture fragment.  More proximally, there is no acute fracture or dislocation. There is evidence of old trauma with postoperative fixation. There is myositis ossificans superior to the greater trochanter. No dislocation. There is a joint effusion. There is extensive vascular atherosclerosis. IMPRESSION: Comminuted fracture distal femoral metaphysis with posterior displacement of distal major fracture fragment with respect to proximal major fragment. Advanced arthropathy in the knee joint without frank dislocation. There is a moderate joint effusion. Postoperative change proximally without acute fracture or dislocation proximally. There is extensive femoral artery atherosclerosis. Electronically Signed   By: Bretta Bang III M.D.   On: 03/10/2017 14:45        Scheduled Meds: . heparin subcutaneous  5,000 Units Subcutaneous Q8H  . insulin aspart  0-9 Units Subcutaneous Q4H  . levothyroxine  12.5 mcg Intravenous Daily  . mouth rinse  15 mL Mouth Rinse BID  . [START ON 03/12/2017] pneumococcal 23 valent vaccine  0.5 mL Intramuscular Tomorrow-1000   Continuous Infusions: . sodium chloride 10 mL/hr at 03/11/17 1709  . methocarbamol (ROBAXIN)  IV Stopped (03/11/17 0240)     LOS: 1 day    Time spent: > 45 minutes (1715-1815)    ELLIS,ALLISON L., ANP [facility]   If 7PM-7AM, please contact night-coverage [email or web address] Password [facility] 03/11/2017, 5:57 PM

## 2017-03-11 NOTE — Clinical Social Work Note (Signed)
CSW acknowledges SNF consults. Please enter PT/OT orders for evaluations.  Charlynn CourtSarah Greta Yung, CSW [phone]

## 2017-03-11 NOTE — Plan of Care (Signed)
Problem: Nutrition: Goal: Risk of aspiration will decrease Outcome: Progressing Speech therapy to do swallow evaluation.

## 2017-03-11 NOTE — Progress Notes (Addendum)
STROKE TEAM PROGRESS NOTE   HISTORY OF PRESENT ILLNESS (per record) Glenda Dickson is an 83 y.o. female with dementia who initially presented to the [facility] ED from [facility] after having multiple falls over the weekend. The patient was complaining of left knee pain and also had been slumping over to her right side in conjunction with new slurring/mumbling of her speech. It was felt that her underlying dementia may be a predisposing factor, but that some other insult was contributing.   CT head at [facility] revealed a right cerebellar infarction. A closed comminuted intraarticular fracture of the distal left femur also was diagnosed in the ED at [facility]. It was felt that the cerebellar infarct seen on CT may have led to a fall that resulted in the hip fracture.   She has a history of atrial fibrillation and is on Coumadin. Due to anemia with Hgb of 8.3, it was felt at OSH that there was a high likelihood of blood loss and Coumadin was held. Her INR was 2.13 and reversal was initiated in the ED with [facility] and IV vitamin K, both for concerns about bleeding as well as for likely surgical repair of her left femur. Also noted at OSH was renal insufficiency with elevated Cr of 2.41, consistent with ARF on underlying CKD.  In addition to atrial fibrillation, her stroke risk factors include HLD, DM, HTN and CHF with EF of 25-30%.     Patient was not administered IV t-PA because the patient takes coumadin for atrial fibrillation.   She was admitted to general neurology for further evaluation and treatment.   SUBJECTIVE (INTERVAL HISTORY) Her family is at the bedside.  The patient appears lethargic, but opens eyes to voice and intermittently follows commands. MRI still pending, I am not sure about the CT suspicion for right cerebellar infarcts. Her Hb stable but renal function getting worse. She does have asterixis, consistent with encephalopathy. Orthopedics on board.     OBJECTIVE Temp:  [97.5 F (36.4 C)-97.6 F (36.4 C)] 97.5 F (36.4 C) (06/19 1138) Pulse Rate:  [36-117] 99 (06/19 1138) Cardiac Rhythm: Atrial fibrillation (06/19 0833) Resp:  [14-23] 19 (06/19 1138) BP: (74-156)/(49-139) 116/94 (06/19 1138) SpO2:  [89 %-100 %] 93 % (06/19 1138) Weight:  [88 kg (194 lb)-92.8 kg (204 lb 9.4 oz)] 92.8 kg (204 lb 9.4 oz) (06/18 2256)  CBC:  Recent Labs Lab 03/10/17 1509  03/11/17 0543 03/11/17 1028  WBC 6.5  < > 8.1 7.4  NEUTROABS 4.7  --   --   --   HGB 8.3*  < > 8.2* 8.9*  HCT 25.0*  < > 26.2* 27.7*  MCV 105.9*  < > 106.1* 106.9*  PLT 105*  < > 117* 121*  < > = values in this interval not displayed.  Basic Metabolic Panel:  Recent Labs Lab 03/10/17 1509 03/11/17 1028  NA 138 139  K 5.3* 5.6*  CL 99* 103  CO2 26 23  GLUCOSE 160* 110*  BUN 55* 58*  CREATININE 2.41* 2.78*  CALCIUM 9.1 8.8*    Lipid Panel:    Component Value Date/Time   CHOL 115 03/11/2017 0543   TRIG 98 03/11/2017 0543   HDL 29 (L) 03/11/2017 0543   CHOLHDL 4.0 03/11/2017 0543   VLDL 20 03/11/2017 0543   LDLCALC 66 03/11/2017 0543   HgbA1c:  Lab Results  Component Value Date   HGBA1C 5.8 (H) 09/29/2016   Urine Drug Screen: No results found  for: LABOPIA, COCAINSCRNUR, LABBENZ, AMPHETMU, THCU, LABBARB  Alcohol Level [facility] results found for: [facility]  IMAGING I have personally reviewed the radiological images below and agree with the radiology interpretations. Read text is my interpretation  2D Echo 01/29/2017 Study Conclusions - Left ventricle: Systolic function was severely reduced. The   estimated ejection fraction was in the range of 25% to 30%.  Diffuse hypokinesis. - Regional wall motion abnormality: Akinesis of the mid anterior and basal-mid anteroseptal myocardium. Diastolic function is abnormal, indeterminant grade. - Aortic valve: Moderately calcified annulus. Trileaflet; mildly thickened leaflets. Valve area (VTI): 1.65 cm^2. Valve area   (Vmax):  1.87 cm^2. Valve area (Vmean): 1.73 cm^2.  Mitral valve: Moderately calcified annulus. Mildly thickened   leaflets . - Left atrium: The atrium was severely dilated. - Right ventricle: The cavity size was mildly to moderately dilated. Systolic function was mildly reduced. - Right atrium: The atrium was severely dilated. - Atrial septum: There was a patent foramen ovale. - Tricuspid valve: There was moderate-severe regurgitation. - Pulmonary arteries: Systolic pressure was moderately increased.   PA peak pressure: 50 mm Hg (S). - Pericardium, extracardiac: There is a moderate sized left pleural effusion. There is a trivial circumferential pericardial effusion. - Ordered as limited study, additional imaging done for the study to further evaluate new cardiac dysfunction detected.  Dg Chest 1 View 03/10/2017 IMPRESSION: Chronic left lower lobe atelectasis and/or pneumonia and left pleural effusion. Stable cardiomegaly. No new abnormalities since 01/14/2017.   Ct Head Wo Contrast 03/10/2017 IMPRESSION: Recent appearing infarct in the inferior right cerebellar hemisphere. Suspect infarct involving a portion of the right posterior inferior cerebral artery distribution. No other acute appearing infarct evident. There is underlying atrophy with mild periventricular small vessel disease. There is no mass, hemorrhage, or extra-axial fluid collection. There are foci of arterial calcification in the anterior and posterior circulations. There is paranasal sinus disease, most marked in the right sphenoid sinus region. However, it is not convinced to me that there is right cerebellum hypodensity.  Dg Hip Unilat W Or Wo Pelvis 2-3 Views Left 03/10/2017 IMPRESSION: No acute fracture or dislocation. Mild narrowing of both hip joints. Myositis ossifications superior to the left greater trochanter. Postoperative change on the left. Extensive aortoiliac and femoral artery atherosclerosis.  Dg Femur Min 2 Views  Left 03/10/2017 IMPRESSION: Comminuted fracture distal femoral metaphysis with posterior displacement of distal major fracture fragment with respect to proximal major fragment. Advanced arthropathy in the knee joint without frank dislocation. There is a moderate joint effusion. Postoperative change proximally without acute fracture or dislocation proximally. There is extensive femoral artery atherosclerosis.   MRI and MRA pending  CUS pending   PHYSICAL EXAM  Temp:  [97.5 F (36.4 C)-97.6 F (36.4 C)] 97.5 F (36.4 C) (06/19 1138) Pulse Rate:  [36-117] 82 (06/19 1613) Resp:  [13-23] 14 (06/19 1628) BP: (83-156)/(55-139) 94/76 (06/19 1628) SpO2:  [71 %-100 %] 71 % (06/19 1613) Weight:  [194 lb (88 kg)-204 lb 9.4 oz (92.8 kg)] 204 lb 9.4 oz (92.8 kg) (06/18 2256)  General - Well nourished, well developed, lethargic and sleepy.  Ophthalmologic - Fundi not visualized due to noncooperation.  Cardiovascular - irregularly irregular heart rate and rhythm.  Neuro - sleepy and lethargic, briefly open eyes on voice. Nonverbal and not following commands. PERRL, conjugate eyes in middle position, inconsistent blinking to visual threat bilaterally, no significant facial droop, tongue midline inside mouth. Moving both arms and right lower extremity spontaneously, however, drift to bed within  5 seconds, left lower stream the on brace due to fracture. DTR 1+, no Babinski. With both arms raising up, significant asterixis bilaterally noted. Sensation, coordination and gait not tested.    ASSESSMENT/PLAN Glenda Dickson is a 83 y.o. female with history of atrial fibrillation, HTN, HLD, remote left hip fracture, DM, Dementia/Depression, CHF (EF 25-30%) presenting with slurred speech and a comminuted distal left femur fracture believed to have happened subsequent to her stroke. She did not receive IV t-PA because the patient takes coumadin for atrial fibrillation    Encephalopathy - could be due to AKI  on CKD  Lethargic and sleepy  Bilateral significant asterixis  Cre 1.50 baseline -> 2.41 -> 2.78  Hyperkalemia 5.3->5.6  Treatment as per primary team  Anemia  Hb 11.8 in 12/2016, this admission 8.3->8.6->8.2->8.9->8.8   Stable  Stool guaiac pending  No GI bleeding as per family  Anemia likely due to CKD  Chronic Atrial fibrillation  On Coumadin at home  INR at admission 2.13 ->1.55  Reversed with Concentra due to concerns of anemia and possibility of surgical procedure  Patient at high risk of stroke  Need to resume anticoagulation once anemia improved and post procedure  Questionable stroke: ? right cerebellar infarct - MRI pending  CT head suspicious for right cerebellar infarct, however, I'm not convinced at this time  MRI head: pending  MRA head: pending  Carotid Doppler: pending  2D Echo EF 25-30% in 01/2017, PFO  LDL 66  HgbA1c pending   SCDs for VTE prophylaxis  Diet NPO time specified  warfarin daily prior to admission, now on No antithrombotic due to anemia  Patient counseled to be compliant with her antithrombotic medications  Ongoing aggressive stroke risk factor management  Therapy recommendations: pending  Disposition: pending  Left distal comminuted femur fracture  Orthopedic on board  Left lower extremity on brace  Timing for procedure will be decided by orthopedics  Hyperlipidemia  Home meds: atorvastatin 40 mg PO daily, presently held while patient is NPO and awaiting orthopedic surgery.  Will resume once pt has NG tube or can tolerate oral medications.   LDL 66, goal < 70  Continue statin at discharge  Diabetes  HgbA1c 5.8, goal < 7.0  Controlled  Other Stroke Risk Factors  Advanced age  Obesity, Body mass index is 34.05 kg/m., recommend weight loss, diet and exercise as appropriate   Coronary artery disease/ischemic cardiomyopathy  HFrEF (25-35%)  Other Active Problems  Hyperkalemia  Chronic  kidney disease, Stage 4  Dementia  Baseline home most of time wheelchair-bound, need assistance for short distance walking  Hospital day # 1  Marvel Plan, MD PhD Stroke Neurology 03/11/2017 6:26 PM    To contact Stroke Continuity provider, please refer to [email or web address]. After hours, contact General Neurology

## 2017-03-11 NOTE — Progress Notes (Signed)
Hypoglycemic Event  CBG: 45  Treatment: D50 IV 25 mL  Symptoms: Pale  Follow-up CBG: Time:1950 CBG Result:97  Possible Reasons for Event: Inadequate meal intake  Comments/MD notified:MD notified orders received Patient resting    Toya SmothersWorley III, Chrstopher Malenfant Lee

## 2017-03-11 NOTE — Consult Note (Signed)
Referring Physician: Dr. Lorin Mercy    Chief Complaint: Stroke seen on CT head  HPI: Glenda Dickson is an 83 y.o. female with dementia who initially presented to the [facility] ED from [facility] after having multiple falls over the weekend. The patient was complaining of left knee pain and also had been slumping over to her right side in conjunction with new slurring/mumbling of her speech. It was felt that her underlying dementia may be a predisposing factor, but that some other insult was contributing.   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 A closed comminuted intraarticular fracture of the distal left femur also was diagnosed in the ED at [facility]. It was felt that the cerebellar infarct seen on CT may have led to a fall that resulted in the hip fracture.   She has a history of atrial fibrillation and is on oral anticoagulation. Due to anemia with Hgb of 8.3, it was felt at OSH that there was a high likelihood of blood loss and Coumadin was held. Her INR was 2.13 and reversal was initiated in the ED with [facility] and IV vitamin K, both for concerns about bleeding as well as for likely surgical repair of her left femur. Also noted at OSH was renal insufficiency with elevated Cr of 2.41, consistent with ARF on underlying CKD.    In addition to atrial fibrillation, her stroke risk factors include HLD, DM, HTN and CHF with EF of 25-30%.     Past Medical History:  Diagnosis Date  . Anemia, normocytic normochromic 06/26/2012   H&H-11.2/33.7, normal MCV in 06/2012   . Atrial fibrillation (HCC)    on anticoagulation  . CHF (congestive heart failure) ([place])   . Dementia 10/2016   Aricept did not help, mood stabilizer did  . Depression   . Diabetes mellitus type II    no insulin  . Fracture of left hip ([place]) 2006   intertrochanteric; ORIF - repaired by [facility]  . Hyperlipidemia   . Hypertension     Past Surgical History:  Procedure Laterality Date  .  ABDOMINAL HYSTERECTOMY    . COLONOSCOPY  2003   Negative screening study.  Clista Bernhardt HIP FRACTURE SURGERY     Fixation    Family History  Problem Relation Age of Onset  . Hypertension Mother   . Heart failure Mother   . Heart attack Father   . Hypertension Father   . Hypertension Sister        Atrial Fib  . Hypertension Brother        4/6 brothers with htn   Social History:  reports that she has never smoked. She has never used smokeless tobacco. She reports that she does not drink alcohol or use drugs.  Allergies:  Allergies  Allergen Reactions  . Benzalkonium Chloride-Alcohol Other (See Comments)    Patient/family is not familiar with this allergy  . Codeine Itching and Nausea Only   Home Medications: acetaminophen (TYLENOL) 325 MG tablet Take 650 mg by mouth every 6 (six) hours as needed for mild pain or moderate pain.  Karmen Bongo, MD Not Ordered  atorvastatin (LIPITOR) 40 MG tablet Take 40 mg by mouth every evening.  Karmen Bongo, MD Reordered  Orderedas:atorvastatin (LIPITOR) tablet 40 mg - 40 mg, Oral, Every evening, First dose on Mon 03/10/17 at 2300  Calcium Citrate-Vitamin D 315-250 MG-UNIT TABS Take 1 tablet by mouth daily. Karmen Bongo, MD Not Ordered  ferrous sulfate 325 (65  FE) MG tablet Take 325 mg by mouth daily with breakfast.  Karmen Bongo, MD Reordered  Orderedas:ferrous sulfate tablet 325 mg - 325 mg, Oral, Daily with breakfast, First dose on Tue 03/11/17 at 0800  furosemide (LASIX) 20 MG tablet Take 20 mg by mouth daily. Karmen Bongo, MD Not Ordered  glipiZIDE (GLUCOTROL XL) 2.5 MG 24 hr tablet Take 2.5 mg by mouth daily with breakfast.  Karmen Bongo, MD Not Ordered  metoprolol tartrate (LOPRESSOR) 50 MG tablet Take 1 tablet by mouth 2 (two) times daily. Karmen Bongo, MD Reordered  Orderedas:metoprolol tartrate (LOPRESSOR) tablet 50 mg - 50 mg, Oral, 2 times daily, First dose on Mon 03/10/17 at 2300  Multiple Vitamin  (MULTIVITAMIN) tablet Take 1 tablet by mouth daily. Karmen Bongo, MD Not Ordered  nystatin (MYCOSTATIN/NYSTOP) powder Apply topically 3 (three) times daily as needed. Karmen Bongo, MD Not Ordered  PARoxetine (PAXIL) 20 MG tablet Take 20 mg by mouth daily. Karmen Bongo, MD Reordered  Orderedas:PARoxetine (PAXIL) tablet 20 mg - 20 mg, Oral, Daily, First dose on Tue 03/11/17 at 1000  potassium chloride (KLOR-CON) 20 MEQ packet Take 20 mEq by mouth daily. Karmen Bongo, MD Not Ordered  QUEtiapine (SEROQUEL) 50 MG tablet Take 1 tablet by mouth 2 (two) times daily. Karmen Bongo, MD Reordered  Orderedas:QUEtiapine (SEROQUEL) tablet 50 mg - 50 mg, Oral, 2 times daily, First dose on Mon 03/10/17 at 2300  ramipril (ALTACE) 10 MG capsule Take 1 capsule (10 mg total) by mouth daily. Karmen Bongo, MD Not Ordered  traZODone (DESYREL) 150 MG tablet Take 150 mg by mouth at bedtime as needed for sleep. Karmen Bongo, MD Not Ordered  warfarin (COUMADIN) 2.5 MG tablet Take 2.5 mg by mouth daily at 6 PM. Take 2 tablet tonight (6/11) then 1.5 tablet daily except on Sunday, Tuesday and Thursday, take 1 tablet Karmen Bongo, MD Not Ordered  LORazepam (ATIVAN) 0.5 MG tablet Take 1 tablet (0.5 mg total) by mouth every 8 (eight) hours as needed for anxiety. Karmen Bongo, MD Discontinued  metoprolol succinate (TOPROL-XL) 50 MG 24 hr tablet Take 1 tablet (50 mg total) by mouth 2 (two) times daily. Take with or immediately following a meal.   This replaces metoprolol tartrate Karmen Bongo, MD [facility] Medications:  Scheduled: . atorvastatin  40 mg Oral QPM  . ferrous sulfate  325 mg Oral Q breakfast  . insulin aspart  0-15 Units Subcutaneous TID WC  . mouth rinse  15 mL Mouth Rinse BID  . metoprolol tartrate  50 mg Oral BID  . PARoxetine  20 mg Oral Daily  . [START ON 03/12/2017] pneumococcal 23 valent vaccine  0.5 mL Intramuscular Tomorrow-1000  . QUEtiapine  50 mg Oral BID    Continuous: . sodium chloride 100 mL/hr at 03/11/17 0300  . methocarbamol (ROBAXIN)  IV Stopped (03/11/17 0240)   NOM:[id number], methocarbamol **OR** methocarbamol (ROBAXIN)  IV, morphine injection, ondansetron (ZOFRAN) IV  ROS:  Unable to obtain due to AMS.   Physical Examination: Blood pressure 93/82, pulse (!) 117, temperature 97.6 F (36.4 C), temperature source Axillary, resp. rate 14, height '5\' 5"'$  (1.651 m), weight 92.8 kg (204 lb 9.4 oz), SpO2 100 %.  HEENT: [place]/AT Lungs: Respirations unlabored Ext: Left leg splint noted. Feet warm and well-perfused  Neurologic Examination: Mental Status: Somnolent after receiving IV morphine. Does not speak with examiner or follow commands. Mild agitation. Pained affect.  Cranial Nerves: II:  Blinks to threat bilaterally. PERRL. Closes eyes tightly  in response to bright light.  III,IV, VI: Eyes conjugate. Did not track to command.  V,VII: Grimace to noxious is symmetric. VIII: Unable to formally test. IX,X: Unable to assess XI: Head at midline.  XII: Does not protrude tongue to command Motor/Sensory: Semipurposefully moves upper extremities antigravity without asymmetry in response to light noxious stimuli.  Intact ankle dorsiflexion to noxious stimuli. Further testing deferred due to left femur fracture.  Deep Tendon Reflexes:  Hypoactive right patella. Unable to assess left patella due to leg splint. Deferred upper extremity reflexes due to agitation and pain sensitivity.  Plantars: Equivocal bilaterally Cerebellar/Gait: Unable to assess.   Results for orders placed or performed during the hospital encounter of 03/10/17 (from the past 48 hour(s))  Urinalysis, Routine w reflex microscopic     Status: Abnormal   Collection Time: 03/10/17 10:00 AM  Result Value Ref Range   Color, Urine AMBER (A) YELLOW    Comment: BIOCHEMICALS MAY BE AFFECTED BY COLOR   APPearance TURBID (A) CLEAR   Specific Gravity, Urine 1.019  1.005 - 1.030   pH 5.0 5.0 - 8.0   Glucose, UA NEGATIVE NEGATIVE mg/dL   Hgb urine dipstick NEGATIVE NEGATIVE   Bilirubin Urine NEGATIVE NEGATIVE   Ketones, ur NEGATIVE NEGATIVE mg/dL   Protein, ur NEGATIVE NEGATIVE mg/dL   Nitrite NEGATIVE NEGATIVE   Leukocytes, UA SMALL (A) NEGATIVE   RBC / HPF 0-5 0 - 5 RBC/hpf   WBC, UA 0-5 0 - 5 WBC/hpf   Bacteria, UA RARE (A) NONE SEEN   Squamous Epithelial / LPF 0-5 (A) NONE SEEN   Mucous PRESENT     Comment: Performed at [facility], [address], [place], [place] [address]  CBG monitoring, ED     Status: Abnormal   Collection Time: 03/10/17  1:13 PM  Result Value Ref Range   Glucose-Capillary 151 (H) 65 - 99 mg/dL  Basic metabolic panel     Status: Abnormal   Collection Time: 03/10/17  3:09 PM  Result Value Ref Range   Sodium 138 135 - 145 mmol/L   Potassium 5.3 (H) 3.5 - 5.1 mmol/L   Chloride 99 (L) 101 - 111 mmol/L   CO2 26 22 - 32 mmol/L   Glucose, Bld 160 (H) 65 - 99 mg/dL   BUN 55 (H) 6 - 20 mg/dL   Creatinine, Ser 2.41 (H) 0.44 - 1.00 mg/dL   Calcium 9.1 8.9 - 10.3 mg/dL   GFR calc non Af Amer 17 (L) >60 mL/min   GFR calc Af Amer 20 (L) >60 mL/min    Comment: (NOTE) The eGFR has been calculated using the CKD EPI equation. This calculation has not been validated in all clinical situations. eGFR's persistently <60 mL/min signify possible Chronic Kidney Disease.    Anion gap 13 5 - 15  CBC with Differential     Status: Abnormal   Collection Time: 03/10/17  3:09 PM  Result Value Ref Range   WBC 6.5 4.0 - 10.5 K/uL   RBC 2.36 (L) 3.87 - 5.11 MIL/uL   Hemoglobin 8.3 (L) 12.0 - 15.0 g/dL   HCT 25.0 (L) 36.0 - 46.0 %   MCV 105.9 (H) 78.0 - 100.0 fL   MCH 35.2 (H) 26.0 - 34.0 pg   MCHC 33.2 30.0 - 36.0 g/dL   RDW 23.0 (H) 11.5 - 15.5 %   Platelets 105 (L) 150 - 400 K/uL    Comment: SPECIMEN CHECKED FOR CLOTS PLATELET COUNT CONFIRMED BY SMEAR  Neutrophils Relative % 72 %   Neutro Abs 4.7 1.7 - 7.7 K/uL    Lymphocytes Relative 18 %   Lymphs Abs 1.2 0.7 - 4.0 K/uL   Monocytes Relative 9 %   Monocytes Absolute 0.6 0.1 - 1.0 K/uL   Eosinophils Relative 1 %   Eosinophils Absolute 0.0 0.0 - 0.7 K/uL   Basophils Relative 0 %   Basophils Absolute 0.0 0.0 - 0.1 K/uL   RBC Morphology POLYCHROMASIA PRESENT   Hepatic function panel     Status: Abnormal   Collection Time: 03/10/17  3:09 PM  Result Value Ref Range   Total Protein 5.6 (L) 6.5 - 8.1 g/dL   Albumin 3.4 (L) 3.5 - 5.0 g/dL   AST 42 (H) 15 - 41 U/L   ALT 29 14 - 54 U/L   Alkaline Phosphatase 124 38 - 126 U/L   Total Bilirubin 4.4 (H) 0.3 - 1.2 mg/dL   Bilirubin, Direct 1.8 (H) 0.1 - 0.5 mg/dL   Indirect Bilirubin 2.6 (H) 0.3 - 0.9 mg/dL  Protime-INR     Status: Abnormal   Collection Time: 03/10/17  3:09 PM  Result Value Ref Range   Prothrombin Time 24.2 (H) 11.4 - 15.2 seconds   INR 2.13   TSH     Status: Abnormal   Collection Time: 03/10/17  3:09 PM  Result Value Ref Range   TSH 171.577 (H) 0.350 - 4.500 uIU/mL    Comment: Performed by a 3rd Generation assay with a functional sensitivity of <=0.01 uIU/mL.  CBC     Status: Abnormal   Collection Time: 03/10/17 11:16 PM  Result Value Ref Range   WBC 7.5 4.0 - 10.5 K/uL   RBC 2.51 (L) 3.87 - 5.11 MIL/uL   Hemoglobin 8.6 (L) 12.0 - 15.0 g/dL   HCT 27.0 (L) 36.0 - 46.0 %   MCV 107.6 (H) 78.0 - 100.0 fL   MCH 34.3 (H) 26.0 - 34.0 pg   MCHC 31.9 30.0 - 36.0 g/dL   RDW 23.3 (H) 11.5 - 15.5 %   Platelets 130 (L) 150 - 400 K/uL  Protime-INR     Status: Abnormal   Collection Time: 03/10/17 11:16 PM  Result Value Ref Range   Prothrombin Time 17.2 (H) 11.4 - 15.2 seconds   INR 1.39   Surgical pcr screen     Status: None   Collection Time: 03/10/17 11:18 PM  Result Value Ref Range   MRSA, PCR NEGATIVE NEGATIVE   Staphylococcus aureus NEGATIVE NEGATIVE    Comment:        The Xpert SA Assay (FDA approved for NASAL specimens in patients over 16 years of age), is one component of a  comprehensive surveillance program.  Test performance has been validated by [facility] for patients greater than or equal to 25 year old. It is not intended to diagnose infection nor to guide or monitor treatment.   Type and screen [place]     Status: None   Collection Time: 03/10/17 11:22 PM  Result Value Ref Range   ABO/RH(D) A NEG    Antibody Screen NEG    Sample Expiration 03/13/2017   ABO/Rh     Status: None (Preliminary result)   Collection Time: 03/10/17 11:22 PM  Result Value Ref Range   ABO/RH(D) A NEG   Glucose, capillary     Status: Abnormal   Collection Time: 03/11/17 12:05 AM  Result Value Ref Range   Glucose-Capillary 113 (H) 65 -  99 mg/dL  Glucose, capillary     Status: Abnormal   Collection Time: 03/11/17  3:11 AM  Result Value Ref Range   Glucose-Capillary 131 (H) 65 - 99 mg/dL   Dg Chest 1 View  Result Date: 03/10/2017 CLINICAL DATA:  83 year old with multiple recent falls, presenting with jaundice, slurred speech and shortness of breath. Current history of hypertension, diabetes, chronic diastolic CHF. EXAM: CHEST 1 VIEW COMPARISON:  01/14/2017, 10/23/2016 and earlier. FINDINGS: Cardiac silhouette moderately to markedly enlarged, unchanged. Pulmonary vascularity normal without evidence of pulmonary edema. Chronic dense consolidation in the left lower lobe and associated left pleural effusion, unchanged dating back to January, 2018. Lungs otherwise clear. No visible right pleural effusion. Degenerative changes again noted involving the right shoulder. IMPRESSION: Chronic left lower lobe atelectasis and/or pneumonia and left pleural effusion. Stable cardiomegaly. No new abnormalities since 04/24 1,018. Electronically Signed   By: Evangeline Dakin M.D.   On: 03/10/2017 15:08   Ct Head Wo Contrast  Result Date: 03/10/2017 CLINICAL DATA:  Several recent falls.  Slurred speech. EXAM: CT HEAD WITHOUT CONTRAST TECHNIQUE: Contiguous axial images were  obtained from the base of the skull through the vertex without intravenous contrast. COMPARISON:  Head CT October 23, 2016 and brain MRI October 23, 2016 FINDINGS: Brain: There is moderate diffuse atrophy. There is a focal infarct in the inferior right cerebellar hemisphere with cytotoxic edema in the inferior to mid right cerebellar hemisphere, not present on prior studies. There is no intracranial mass, hemorrhage, extra-axial fluid collection, or midline shift. There is mild small vessel disease in the centra semiovale bilaterally. No other recent/acute appearing infarct is appreciable on this study. Vascular: There is no appreciable hyperdense vessel. There is calcification in each carotid siphon as well as in each distal vertebral artery. Skull: The bony calvarium appears intact. Sinuses/Orbits: There is mucosal thickening in several ethmoid air cells bilaterally. There is diffuse opacification in the right sphenoid sinus. Other paranasal sinuses are clear. Orbits appear symmetric bilaterally. Other: Mastoid air cells are clear. IMPRESSION: Recent appearing infarct in the inferior right cerebellar hemisphere. Suspect infarct involving a portion of the right posterior inferior cerebral artery distribution. No other acute appearing infarct evident. There is underlying atrophy with mild periventricular small vessel disease. There is no mass, hemorrhage, or extra-axial fluid collection. There are foci of arterial calcification in the anterior and posterior circulations. There is paranasal sinus disease, most marked in the right sphenoid sinus region. Electronically Signed   By: Lowella Grip III M.D.   On: 03/10/2017 14:08   Dg Hip Unilat W Or Wo Pelvis 2-3 Views Left  Result Date: 03/10/2017 CLINICAL DATA:  Pain.  Multiple recent falls EXAM: DG HIP (WITH OR WITHOUT PELVIS) 2-3V LEFT COMPARISON:  None. FINDINGS: Frontal pelvis as well as frontal and lateral left hip images or obtained. There is  postoperative change in the proximal left femur. Tip of screw is in the proximal femoral head. There is myositis ossificans dorsal to the greater tuberosity. There is mild symmetric narrowing of both hip joints. There is no acute fracture or dislocation. There is extensive aortoiliac and bilateral common and superficial femoral artery atherosclerosis. IMPRESSION: No acute fracture or dislocation. Mild narrowing of both hip joints. Myositis ossifications superior to the left greater trochanter. Postoperative change on the left. Extensive aortoiliac and femoral artery atherosclerosis. Electronically Signed   By: Lowella Grip III M.D.   On: 03/10/2017 14:43   Dg Femur Min 2 Views Left  Result Date:  03/10/2017 CLINICAL DATA:  Pain following recent fall EXAM: LEFT FEMUR 2 VIEWS COMPARISON:  None. FINDINGS: Frontal and lateral views were obtained. There is a comminuted fracture of the distal femoral metaphysis with posterior displacement of distal major fracture fragment with respect to proximal major fracture fragment. More proximally, there is no acute fracture or dislocation. There is evidence of old trauma with postoperative fixation. There is myositis ossificans superior to the greater trochanter. No dislocation. There is a joint effusion. There is extensive vascular atherosclerosis. IMPRESSION: Comminuted fracture distal femoral metaphysis with posterior displacement of distal major fracture fragment with respect to proximal major fragment. Advanced arthropathy in the knee joint without frank dislocation. There is a moderate joint effusion. Postoperative change proximally without acute fracture or dislocation proximally. There is extensive femoral artery atherosclerosis. Electronically Signed   By: Lowella Grip III M.D.   On: 03/10/2017 14:45    Assessment: 83 y.o. female with AMS and possible right cerebellar infarction on low-resolution CT head. Presented with acute left femur fracture.  1.  Dementia with worsened cognition. Neurological examination is limited by pain and AMS, but no definite lateralized deficit. Unable to test cerebellar function due to impaired ability to follow commands. DDx for worsened cognition includes a new cortical stroke not visible on initial CT head, as well as delirium secondary to pain and ARF.  2. CT head: Recent appearing infarct in the inferior right cerebellar hemisphere. Suspect infarct involving a portion of the right PICA distribution. Most likely secondary to cardioembolic etiology given atrial fibrillation and CHF with low EF.  3. Stroke Risk Factors - Atrial fibrillation, HLD, DM, HTN and CHF with EF of 25-30%. 4. Atrial fibrillation, now off anticoagulation due to possible left thigh hematoma and anticipation of surgical repair of left femur fracture.  5. Also noted on CT is underlying atrophy with mild periventricular small vessel disease.  Plan: 1. MRI, MRA of the brain without contrast when stable 2. Carotid dopplers 3. TTE done in 5/18, will not repeat TTE. If stroke is verified on MRI, will need TEE. 4. PT consult, OT consult, Speech consult 5. ASA daily post-operatively until able to be anticoagulated. Agree with Dr. Lorin Mercy that given her new CHA2DS2-VASc score of 8, with an adjusted annual stroke rate of 6.7%, she will need resumption of anticoagulation as soon as reasonably safe 6. Telemetry monitoring 7. Frequent neuro checks 8. Hold atorvastatin for now. CK level unlikely to be informative regarding possible statin-induced myopathy as she has had recent falls which would be the most likely explanation for an elevated CK level. Additionally, given her advanced age, benefits of statin are most likely outweighed by risks.   '@Electronically'$  signed: Dr. Kerney Elbe 03/11/2017, 4:40 AM

## 2017-03-11 NOTE — Consult Note (Signed)
[facility] Consultation  Reason for Consult: Left supracondylar femur fracture Referring Physician: Thomasenia Bottoms MD  Glenda Dickson is an 83 y.o. female.  HPI: New stroke and fall with left supracondylar femur fracture, significantly displaced. Baseline dementia, NIDDM, CHF, renal insufficiency, and prior stroke. Injury presumed to have occurred on Saturday.   Past Medical History:  Diagnosis Date  . Anemia, normocytic normochromic 06/26/2012   H&H-11.2/33.7, normal MCV in 06/2012   . Atrial fibrillation (HCC)    on anticoagulation  . CHF (congestive heart failure) ([place])   . Dementia 10/2016   Aricept did not help, mood stabilizer did  . Depression   . Diabetes mellitus type II    no insulin  . Fracture of left hip ([place]) 2006   intertrochanteric; ORIF - repaired by [place]  . Hyperlipidemia   . Hypertension     Past Surgical History:  Procedure Laterality Date  . ABDOMINAL HYSTERECTOMY    . COLONOSCOPY  2003   Negative screening study.  Clista Bernhardt HIP FRACTURE SURGERY     Fixation    Family History  Problem Relation Age of Onset  . Hypertension Mother   . Heart failure Mother   . Heart attack Father   . Hypertension Father   . Hypertension Sister        Atrial Fib  . Hypertension Brother        4/6 brothers with htn    Social History:  reports that she has never smoked. She has never used smokeless tobacco. She reports that she does not drink alcohol or use drugs.  Allergies:  Allergies  Allergen Reactions  . Benzalkonium Chloride-Alcohol Other (See Comments)    Patient/family is not familiar with this allergy  . Codeine Itching and Nausea Only    Medications:  Prior to Admission:  Prescriptions Prior to Admission  Medication Sig Dispense Refill Last Dose  . acetaminophen (TYLENOL) 325 MG tablet Take 650 mg by mouth every 6 (six) hours as needed for mild pain or moderate pain.    unknown  . atorvastatin (LIPITOR) 40 MG tablet  Take 40 mg by mouth every evening.    03/09/2017 at Unknown time  . Calcium Citrate-Vitamin D 315-250 MG-UNIT TABS Take 1 tablet by mouth daily.   03/10/2017 at Unknown time  . ferrous sulfate 325 (65 FE) MG tablet Take 325 mg by mouth daily with breakfast.   5 03/10/2017 at Unknown time  . furosemide (LASIX) 20 MG tablet Take 20 mg by mouth daily.   03/10/2017 at Unknown time  . glipiZIDE (GLUCOTROL XL) 2.5 MG 24 hr tablet Take 2.5 mg by mouth daily with breakfast.    03/10/2017 at Unknown time  . metoprolol tartrate (LOPRESSOR) 50 MG tablet Take 1 tablet by mouth 2 (two) times daily.   03/10/2017 at 0800  . Multiple Vitamin (MULTIVITAMIN) tablet Take 1 tablet by mouth daily.   03/10/2017 at Unknown time  . nystatin (MYCOSTATIN/NYSTOP) powder Apply topically 3 (three) times daily as needed.   unknown  . PARoxetine (PAXIL) 20 MG tablet Take 20 mg by mouth daily.   03/10/2017 at Unknown time  . potassium chloride (KLOR-CON) 20 MEQ packet Take 20 mEq by mouth daily.   03/10/2017 at Unknown time  . QUEtiapine (SEROQUEL) 50 MG tablet Take 1 tablet by mouth 2 (two) times daily.   03/10/2017 at Unknown time  . ramipril (ALTACE) 10 MG capsule Take 1 capsule (10 mg total) by mouth daily. 30 capsule 6  03/10/2017 at Unknown time  . traZODone (DESYREL) 150 MG tablet Take 150 mg by mouth at bedtime as needed for sleep.   unknown  . warfarin (COUMADIN) 2.5 MG tablet Take 2.5 mg by mouth daily at 6 PM. Take 2 tablet tonight (6/11) then 1.5 tablet daily except on Sunday, Tuesday and Thursday, take 1 tablet   03/09/2017 at 1700    Results for orders placed or performed during the hospital encounter of 03/10/17 (from the past 48 hour(s))  Urinalysis, Routine w reflex microscopic     Status: Abnormal   Collection Time: 03/10/17 10:00 AM  Result Value Ref Range   Color, Urine AMBER (A) YELLOW    Comment: BIOCHEMICALS MAY BE AFFECTED BY COLOR   APPearance TURBID (A) CLEAR   Specific Gravity, Urine 1.019 1.005 - 1.030   pH  5.0 5.0 - 8.0   Glucose, UA NEGATIVE NEGATIVE mg/dL   Hgb urine dipstick NEGATIVE NEGATIVE   Bilirubin Urine NEGATIVE NEGATIVE   Ketones, ur NEGATIVE NEGATIVE mg/dL   Protein, ur NEGATIVE NEGATIVE mg/dL   Nitrite NEGATIVE NEGATIVE   Leukocytes, UA SMALL (A) NEGATIVE   RBC / HPF 0-5 0 - 5 RBC/hpf   WBC, UA 0-5 0 - 5 WBC/hpf   Bacteria, UA RARE (A) NONE SEEN   Squamous Epithelial / LPF 0-5 (A) NONE SEEN   Mucous PRESENT     Comment: Performed at [facility], [address] [place], [place] [address]  CBG monitoring, ED     Status: Abnormal   Collection Time: 03/10/17  1:13 PM  Result Value Ref Range   Glucose-Capillary 151 (H) 65 - 99 mg/dL  Basic metabolic panel     Status: Abnormal   Collection Time: 03/10/17  3:09 PM  Result Value Ref Range   Sodium 138 135 - 145 mmol/L   Potassium 5.3 (H) 3.5 - 5.1 mmol/L   Chloride 99 (L) 101 - 111 mmol/L   CO2 26 22 - 32 mmol/L   Glucose, Bld 160 (H) 65 - 99 mg/dL   BUN 55 (H) 6 - 20 mg/dL   Creatinine, Ser 2.41 (H) 0.44 - 1.00 mg/dL   Calcium 9.1 8.9 - 10.3 mg/dL   GFR calc non Af Amer 17 (L) >60 mL/min   GFR calc Af Amer 20 (L) >60 mL/min    Comment: (NOTE) The eGFR has been calculated using the CKD EPI equation. This calculation has not been validated in all clinical situations. eGFR's persistently <60 mL/min signify possible Chronic Kidney Disease.    Anion gap 13 5 - 15  CBC with Differential     Status: Abnormal   Collection Time: 03/10/17  3:09 PM  Result Value Ref Range   WBC 6.5 4.0 - 10.5 K/uL   RBC 2.36 (L) 3.87 - 5.11 MIL/uL   Hemoglobin 8.3 (L) 12.0 - 15.0 g/dL   HCT 25.0 (L) 36.0 - 46.0 %   MCV 105.9 (H) 78.0 - 100.0 fL   MCH 35.2 (H) 26.0 - 34.0 pg   MCHC 33.2 30.0 - 36.0 g/dL   RDW 23.0 (H) 11.5 - 15.5 %   Platelets 105 (L) 150 - 400 K/uL    Comment: SPECIMEN CHECKED FOR CLOTS PLATELET COUNT CONFIRMED BY SMEAR    Neutrophils Relative % 72 %   Neutro Abs 4.7 1.7 - 7.7 K/uL   Lymphocytes Relative 18 %    Lymphs Abs 1.2 0.7 - 4.0 K/uL   Monocytes Relative 9 %   Monocytes Absolute 0.6  0.1 - 1.0 K/uL   Eosinophils Relative 1 %   Eosinophils Absolute 0.0 0.0 - 0.7 K/uL   Basophils Relative 0 %   Basophils Absolute 0.0 0.0 - 0.1 K/uL   RBC Morphology POLYCHROMASIA PRESENT   Hepatic function panel     Status: Abnormal   Collection Time: 03/10/17  3:09 PM  Result Value Ref Range   Total Protein 5.6 (L) 6.5 - 8.1 g/dL   Albumin 3.4 (L) 3.5 - 5.0 g/dL   AST 42 (H) 15 - 41 U/L   ALT 29 14 - 54 U/L   Alkaline Phosphatase 124 38 - 126 U/L   Total Bilirubin 4.4 (H) 0.3 - 1.2 mg/dL   Bilirubin, Direct 1.8 (H) 0.1 - 0.5 mg/dL   Indirect Bilirubin 2.6 (H) 0.3 - 0.9 mg/dL  Protime-INR     Status: Abnormal   Collection Time: 03/10/17  3:09 PM  Result Value Ref Range   Prothrombin Time 24.2 (H) 11.4 - 15.2 seconds   INR 2.13   TSH     Status: Abnormal   Collection Time: 03/10/17  3:09 PM  Result Value Ref Range   TSH 171.577 (H) 0.350 - 4.500 uIU/mL    Comment: Performed by a 3rd Generation assay with a functional sensitivity of <=0.01 uIU/mL.  CBC     Status: Abnormal   Collection Time: 03/10/17 11:16 PM  Result Value Ref Range   WBC 7.5 4.0 - 10.5 K/uL   RBC 2.51 (L) 3.87 - 5.11 MIL/uL   Hemoglobin 8.6 (L) 12.0 - 15.0 g/dL   HCT 27.0 (L) 36.0 - 46.0 %   MCV 107.6 (H) 78.0 - 100.0 fL   MCH 34.3 (H) 26.0 - 34.0 pg   MCHC 31.9 30.0 - 36.0 g/dL   RDW 23.3 (H) 11.5 - 15.5 %   Platelets 130 (L) 150 - 400 K/uL  Protime-INR     Status: Abnormal   Collection Time: 03/10/17 11:16 PM  Result Value Ref Range   Prothrombin Time 17.2 (H) 11.4 - 15.2 seconds   INR 1.39   Surgical pcr screen     Status: None   Collection Time: 03/10/17 11:18 PM  Result Value Ref Range   MRSA, PCR NEGATIVE NEGATIVE   Staphylococcus aureus NEGATIVE NEGATIVE    Comment:        The Xpert SA Assay (FDA approved for NASAL specimens in patients over 46 years of age), is one component of a comprehensive  surveillance program.  Test performance has been validated by [facility] for patients greater than or equal to 5 year old. It is not intended to diagnose infection nor to guide or monitor treatment.   Type and screen [place]     Status: None   Collection Time: 03/10/17 11:22 PM  Result Value Ref Range   ABO/RH(D) A NEG    Antibody Screen NEG    Sample Expiration 03/13/2017   ABO/Rh     Status: None (Preliminary result)   Collection Time: 03/10/17 11:22 PM  Result Value Ref Range   ABO/RH(D) A NEG   Glucose, capillary     Status: Abnormal   Collection Time: 03/11/17 12:05 AM  Result Value Ref Range   Glucose-Capillary 113 (H) 65 - 99 mg/dL  Glucose, capillary     Status: Abnormal   Collection Time: 03/11/17  3:11 AM  Result Value Ref Range   Glucose-Capillary 131 (H) 65 - 99 mg/dL  Lipid panel     Status:  Abnormal   Collection Time: 03/11/17  5:43 AM  Result Value Ref Range   Cholesterol 115 0 - 200 mg/dL   Triglycerides 98 <150 mg/dL   HDL 29 (L) >40 mg/dL   Total CHOL/HDL Ratio 4.0 RATIO   VLDL 20 0 - 40 mg/dL   LDL Cholesterol 66 0 - 99 mg/dL    Comment:        Total Cholesterol/HDL:CHD Risk Coronary Heart Disease Risk Table                     Men   Women  1/2 Average Risk   3.4   3.3  Average Risk       5.0   4.4  2 X Average Risk   9.6   7.1  3 X Average Risk  23.4   11.0        Use the calculated Patient Ratio above and the CHD Risk Table to determine the patient's CHD Risk.        ATP III CLASSIFICATION (LDL):  <100     mg/dL   Optimal  100-129  mg/dL   Near or Above                    Optimal  130-159  mg/dL   Borderline  160-189  mg/dL   High  >190     mg/dL   Very High   CBC     Status: Abnormal (Preliminary result)   Collection Time: 03/11/17  5:43 AM  Result Value Ref Range   WBC 8.1 4.0 - 10.5 K/uL   RBC 2.47 (L) 3.87 - 5.11 MIL/uL   Hemoglobin 8.2 (L) 12.0 - 15.0 g/dL   HCT 26.2 (L) 36.0 - 46.0 %   MCV 106.1 (H) 78.0  - 100.0 fL   MCH 33.2 26.0 - 34.0 pg   MCHC 31.3 30.0 - 36.0 g/dL   RDW 23.0 (H) 11.5 - 15.5 %   Platelets PENDING 150 - 400 K/uL  Protime-INR     Status: Abnormal   Collection Time: 03/11/17  5:43 AM  Result Value Ref Range   Prothrombin Time 18.3 (H) 11.4 - 15.2 seconds   INR 1.51     Dg Chest 1 View  Result Date: 03/10/2017 CLINICAL DATA:  83 year old with multiple recent falls, presenting with jaundice, slurred speech and shortness of breath. Current history of hypertension, diabetes, chronic diastolic CHF. EXAM: CHEST 1 VIEW COMPARISON:  01/14/2017, 10/23/2016 and earlier. FINDINGS: Cardiac silhouette moderately to markedly enlarged, unchanged. Pulmonary vascularity normal without evidence of pulmonary edema. Chronic dense consolidation in the left lower lobe and associated left pleural effusion, unchanged dating back to January, 2018. Lungs otherwise clear. No visible right pleural effusion. Degenerative changes again noted involving the right shoulder. IMPRESSION: Chronic left lower lobe atelectasis and/or pneumonia and left pleural effusion. Stable cardiomegaly. No new abnormalities since 04/24 1,018. Electronically Signed   By: Evangeline Dakin M.D.   On: 03/10/2017 15:08   Ct Head Wo Contrast  Result Date: 03/10/2017 CLINICAL DATA:  Several recent falls.  Slurred speech. EXAM: CT HEAD WITHOUT CONTRAST TECHNIQUE: Contiguous axial images were obtained from the base of the skull through the vertex without intravenous contrast. COMPARISON:  Head CT October 23, 2016 and brain MRI October 23, 2016 FINDINGS: Brain: There is moderate diffuse atrophy. There is a focal infarct in the inferior right cerebellar hemisphere with cytotoxic edema in the inferior to mid right cerebellar hemisphere, not present  on prior studies. There is no intracranial mass, hemorrhage, extra-axial fluid collection, or midline shift. There is mild small vessel disease in the centra semiovale bilaterally. No other  recent/acute appearing infarct is appreciable on this study. Vascular: There is no appreciable hyperdense vessel. There is calcification in each carotid siphon as well as in each distal vertebral artery. Skull: The bony calvarium appears intact. Sinuses/Orbits: There is mucosal thickening in several ethmoid air cells bilaterally. There is diffuse opacification in the right sphenoid sinus. Other paranasal sinuses are clear. Orbits appear symmetric bilaterally. Other: Mastoid air cells are clear. IMPRESSION: Recent appearing infarct in the inferior right cerebellar hemisphere. Suspect infarct involving a portion of the right posterior inferior cerebral artery distribution. No other acute appearing infarct evident. There is underlying atrophy with mild periventricular small vessel disease. There is no mass, hemorrhage, or extra-axial fluid collection. There are foci of arterial calcification in the anterior and posterior circulations. There is paranasal sinus disease, most marked in the right sphenoid sinus region. Electronically Signed   By: Lowella Grip III M.D.   On: 03/10/2017 14:08   Dg Hip Unilat W Or Wo Pelvis 2-3 Views Left  Result Date: 03/10/2017 CLINICAL DATA:  Pain.  Multiple recent falls EXAM: DG HIP (WITH OR WITHOUT PELVIS) 2-3V LEFT COMPARISON:  None. FINDINGS: Frontal pelvis as well as frontal and lateral left hip images or obtained. There is postoperative change in the proximal left femur. Tip of screw is in the proximal femoral head. There is myositis ossificans dorsal to the greater tuberosity. There is mild symmetric narrowing of both hip joints. There is no acute fracture or dislocation. There is extensive aortoiliac and bilateral common and superficial femoral artery atherosclerosis. IMPRESSION: No acute fracture or dislocation. Mild narrowing of both hip joints. Myositis ossifications superior to the left greater trochanter. Postoperative change on the left. Extensive aortoiliac and  femoral artery atherosclerosis. Electronically Signed   By: Lowella Grip III M.D.   On: 03/10/2017 14:43   Dg Femur Min 2 Views Left  Result Date: 03/10/2017 CLINICAL DATA:  Pain following recent fall EXAM: LEFT FEMUR 2 VIEWS COMPARISON:  None. FINDINGS: Frontal and lateral views were obtained. There is a comminuted fracture of the distal femoral metaphysis with posterior displacement of distal major fracture fragment with respect to proximal major fracture fragment. More proximally, there is no acute fracture or dislocation. There is evidence of old trauma with postoperative fixation. There is myositis ossificans superior to the greater trochanter. No dislocation. There is a joint effusion. There is extensive vascular atherosclerosis. IMPRESSION: Comminuted fracture distal femoral metaphysis with posterior displacement of distal major fracture fragment with respect to proximal major fragment. Advanced arthropathy in the knee joint without frank dislocation. There is a moderate joint effusion. Postoperative change proximally without acute fracture or dislocation proximally. There is extensive femoral artery atherosclerosis. Electronically Signed   By: Lowella Grip III M.D.   On: 03/10/2017 14:45    ROS Unable to determine. As above. Blood pressure 124/84, pulse (!) 55, temperature 97.6 F (36.4 C), temperature source Axillary, resp. rate 19, height 5' 5" (1.651 m), weight 92.8 kg (204 lb 9.4 oz), SpO2 (!) 89 %. Physical Exam Noncommunicative, somnolent Sallow complexion [place]/AT IRR No wheezing Truncal obesity LLE No traumatic wounds, ecchymosis, or rash  Tender but swelling and deformity minimal and far less than expected  Small effusion  Knee stability not assessed  Sens DPN, SPN, TN unable to assess  Motor  unable to assess  Pulses  not palpable  but brisk CR, No significant edema RLE No traumatic wounds, ecchymosis, or rash  Nontender  No knee effusion  Knee stable   Sens DPN,  SPN, TN unable to assess  Motor  unable to assess  Pulses not palpable  but brisk CR, No significant edema  LUEx shoulder, elbow, wrist, digits- no skin wounds but ecchymosis of forearm, nontender, no instability, tight tone  Sens unable to assess  Motor  unable to assess  No focal tenderness or swelling  Rad palp RUEx shoulder, elbow, wrist, digits- no skin wounds but ecchymosis of forearm, nontender, no instability, tight tone  Sens unable to assess  Motor  unable to assess  No focal tenderness or swelling  Rad palp   Assessment/Plan: Left supracondylar femur, significantly displaced in 83 yo w dementia, new stroke, and multiple medical problems. Level of mobility over last few weeks has largely been bed to chair though daughter reports some ambulation. In this patient return of ambulation would not be anticipated, particularly so if stroke reveals significant extent.   I discussed with the patient's daughter and family the risks and benefits of surgery, including the possibility of death, heart attack, stroke, renal failure, infection, nerve injury, vessel injury, wound breakdown, arthritis, symptomatic hardware, DVT/ PE, loss of motion, and need for further surgery among others.  We also discussed nonsurgical management and potential for nonunion.  At this time I have recommended nonsurgical management but will await MRI results and then discussion with neurology. I think the most likely positive outcome would be a stable platform by which to facilitate bed to chair transfers and bone healing for pain control. These could be accomplished with a reasonable degree of success using bracing.   Will f/u post MRI.   Altamese [place], MD [facility] [phone] (p)  03/11/2017  8:06 AM

## 2017-03-11 NOTE — Progress Notes (Signed)
VASCULAR LAB PRELIMINARY  PRELIMINARY  PRELIMINARY  PRELIMINARY  Carotid duplex completed.    Preliminary report:  Technically difficult due to cardiac arrhythmia, respiratory interference. And anatomy. Velocities indicate a 1% to 39% ICA stenosis however above mentioned difficulties may have obscured hight velocities and level of stenosis.bilaterally. Vertebral artery flow is antegrade bilaterally.  Daeshon Grammatico, RVS 03/11/2017, 6:08 PM

## 2017-03-11 NOTE — Progress Notes (Signed)
SLP Cancellation Note  Patient Details Name: Glenda Dickson MRN: [id number] DOB: 10/13/1927   Cancelled treatment:       Reason Eval/Treat Not Completed: Patient not medically ready. Pt is lethargic, NPO for possible surgery. Will f/u tomorrow for swallow eval.    Glenda Dickson, Glenda Dickson 03/11/2017, 10:54 AM

## 2017-03-11 NOTE — Care Management Note (Addendum)
Case Management Note  Patient Details  Name: Jeani SowSarah T Daughtridge MRN: [id number] Date of Birth: 22-Sep-1928  Subjective/Objective:  From ALF, present with Left Femur fx and CVA,  Afib, jaundiced, oriented to self.  Per ortho note - given limited function favor nonsurgical management but willing to proceed with fixation if reasonable chance of ambulatory recovery after this neurologic event.   6/20 1015 Letha Capeeborah Sharniece Gibbon RN, BSN- decided to go with non operative route.  She may need SNF, will need pt eval.   PCP  Nita SellsJohn Hall                 Action/Plan:  NCM will follow for dc needs.   Expected Discharge Date:                  Expected Discharge Plan:  Skilled Nursing Facility  In-House Referral:  Clinical Social Work  Discharge planning Services  CM Consult  Post Acute Care Choice:    Choice offered to:     DME Arranged:    DME Agency:     HH Arranged:    HH Agency:     Status of Service:  In process, will continue to follow  If discussed at Long Length of Stay Meetings, dates discussed:    Additional Comments:  Leone Havenaylor, Breeley Bischof [place], RN 03/11/2017, 4:04 PM

## 2017-03-12 ENCOUNTER — Inpatient Hospital Stay (HOSPITAL_COMMUNITY): Payer: Medicare Other

## 2017-03-12 DIAGNOSIS — R7989 Other specified abnormal findings of blood chemistry: Secondary | ICD-10-CM

## 2017-03-12 DIAGNOSIS — I6350 Cerebral infarction due to unspecified occlusion or stenosis of unspecified cerebral artery: Secondary | ICD-10-CM

## 2017-03-12 DIAGNOSIS — S7292XA Unspecified fracture of left femur, initial encounter for closed fracture: Secondary | ICD-10-CM

## 2017-03-12 DIAGNOSIS — N179 Acute kidney failure, unspecified: Secondary | ICD-10-CM

## 2017-03-12 DIAGNOSIS — E872 Acidosis, unspecified: Secondary | ICD-10-CM

## 2017-03-12 DIAGNOSIS — Z7901 Long term (current) use of anticoagulants: Secondary | ICD-10-CM

## 2017-03-12 DIAGNOSIS — I5022 Chronic systolic (congestive) heart failure: Secondary | ICD-10-CM

## 2017-03-12 DIAGNOSIS — G9341 Metabolic encephalopathy: Secondary | ICD-10-CM

## 2017-03-12 DIAGNOSIS — I959 Hypotension, unspecified: Secondary | ICD-10-CM

## 2017-03-12 DIAGNOSIS — E035 Myxedema coma: Secondary | ICD-10-CM

## 2017-03-12 DIAGNOSIS — J9601 Acute respiratory failure with hypoxia: Secondary | ICD-10-CM

## 2017-03-12 DIAGNOSIS — R17 Unspecified jaundice: Secondary | ICD-10-CM

## 2017-03-12 LAB — VAS US CAROTID
LCCADSYS: 61 cm/s
LCCAPDIAS: 8 cm/s
LEFT ECA DIAS: 5 cm/s
LEFT VERTEBRAL DIAS: 5 cm/s
Left CCA dist dias: 11 cm/s
Left CCA prox sys: 58 cm/s
Left ICA dist dias: 4 cm/s
Left ICA dist sys: 41 cm/s
Left ICA prox dias: -13 cm/s
Left ICA prox sys: -54 cm/s
RCCADSYS: -37 cm/s
RCCAPSYS: 46 cm/s
RIGHT ECA DIAS: -8 cm/s
RIGHT VERTEBRAL DIAS: 16 cm/s
Right CCA prox dias: 13 cm/s

## 2017-03-12 LAB — URINALYSIS, ROUTINE W REFLEX MICROSCOPIC
Bilirubin Urine: NEGATIVE
GLUCOSE, UA: NEGATIVE mg/dL
Hgb urine dipstick: NEGATIVE
Ketones, ur: NEGATIVE mg/dL
Nitrite: NEGATIVE
PROTEIN: 30 mg/dL — AB
Specific Gravity, Urine: 1.014 (ref 1.005–1.030)
Squamous Epithelial / LPF: NONE SEEN
pH: 5 (ref 5.0–8.0)

## 2017-03-12 LAB — CBC
HCT: 27 % — ABNORMAL LOW (ref 36.0–46.0)
Hemoglobin: 8.7 g/dL — ABNORMAL LOW (ref 12.0–15.0)
MCH: 34.8 pg — AB (ref 26.0–34.0)
MCHC: 32.2 g/dL (ref 30.0–36.0)
MCV: 108 fL — ABNORMAL HIGH (ref 78.0–100.0)
Platelets: 121 10*3/uL — ABNORMAL LOW (ref 150–400)
RBC: 2.5 MIL/uL — ABNORMAL LOW (ref 3.87–5.11)
RDW: 23.5 % — AB (ref 11.5–15.5)
WBC: 7.9 10*3/uL (ref 4.0–10.5)

## 2017-03-12 LAB — GLUCOSE, CAPILLARY
GLUCOSE-CAPILLARY: 100 mg/dL — AB (ref 65–99)
Glucose-Capillary: 103 mg/dL — ABNORMAL HIGH (ref 65–99)
Glucose-Capillary: 97 mg/dL (ref 65–99)
Glucose-Capillary: 118 mg/dL — ABNORMAL HIGH (ref 65–99)
Glucose-Capillary: 156 mg/dL — ABNORMAL HIGH (ref 65–99)

## 2017-03-12 LAB — T4, FREE: Free T4: 0.35 ng/dL — ABNORMAL LOW (ref 0.61–1.12)

## 2017-03-12 LAB — LACTIC ACID, PLASMA
LACTIC ACID, VENOUS: 3.5 mmol/L — AB (ref 0.5–1.9)
LACTIC ACID, VENOUS: 4.1 mmol/L — AB (ref 0.5–1.9)

## 2017-03-12 LAB — TROPONIN I: Troponin I: 0.06 ng/mL (ref <0.03)

## 2017-03-12 LAB — PROTIME-INR
INR: 1.56
PROTHROMBIN TIME: 18.8 s — AB (ref 11.4–15.2)

## 2017-03-12 LAB — CORTISOL-AM, BLOOD: Cortisol - AM: 23.4 ug/dL — ABNORMAL HIGH (ref 6.7–22.6)

## 2017-03-12 MED ORDER — SODIUM CHLORIDE 0.9 % IV SOLN
Freq: Once | INTRAVENOUS | Status: AC
Start: 2017-03-12 — End: 2017-03-12
  Administered 2017-03-12: 250 mL via INTRAVENOUS

## 2017-03-12 MED ORDER — LEVOTHYROXINE SODIUM 100 MCG IV SOLR
50.0000 ug | Freq: Every day | INTRAVENOUS | Status: AC
  Administered 2017-03-13 – 2017-03-15 (×3): 50 ug via INTRAVENOUS
  Filled 2017-03-12 (×3): qty 5

## 2017-03-12 MED ORDER — ATORVASTATIN CALCIUM 40 MG PO TABS
40.0000 mg | ORAL_TABLET | Freq: Every evening | ORAL | Status: DC
  Administered 2017-03-12 – 2017-03-14 (×3): 40 mg via ORAL
  Filled 2017-03-12 (×3): qty 1

## 2017-03-12 MED ORDER — CHLORHEXIDINE GLUCONATE 0.12 % MT SOLN
15.0000 mL | Freq: Two times a day (BID) | OROMUCOSAL | Status: DC
  Administered 2017-03-12 – 2017-03-21 (×14): 15 mL via OROMUCOSAL
  Filled 2017-03-12 (×12): qty 15

## 2017-03-12 MED ORDER — WARFARIN - PHARMACIST DOSING INPATIENT: Freq: Every day | Status: DC

## 2017-03-12 MED ORDER — SODIUM CHLORIDE 0.9 % IV SOLN
INTRAVENOUS | Status: DC
  Administered 2017-03-12 – 2017-03-14 (×4): via INTRAVENOUS

## 2017-03-12 MED ORDER — LIOTHYRONINE SODIUM 5 MCG PO TABS
5.0000 ug | ORAL_TABLET | Freq: Three times a day (TID) | ORAL | Status: DC
  Administered 2017-03-13 – 2017-03-21 (×23): 5 ug via ORAL
  Filled 2017-03-12 (×31): qty 1

## 2017-03-12 MED ORDER — ORAL CARE MOUTH RINSE
15.0000 mL | Freq: Two times a day (BID) | OROMUCOSAL | Status: DC
  Administered 2017-03-12: 15 mL via OROMUCOSAL

## 2017-03-12 MED ORDER — WARFARIN SODIUM 4 MG PO TABS
4.0000 mg | ORAL_TABLET | Freq: Once | ORAL | Status: AC
  Administered 2017-03-12: 4 mg via ORAL
  Filled 2017-03-12: qty 1

## 2017-03-12 MED ORDER — DEXTROSE 5 % IV SOLN
1.0000 g | INTRAVENOUS | Status: DC
  Administered 2017-03-12: 1 g via INTRAVENOUS
  Filled 2017-03-12: qty 10

## 2017-03-12 MED ORDER — INSULIN ASPART 100 UNIT/ML ~~LOC~~ SOLN
0.0000 [IU] | Freq: Three times a day (TID) | SUBCUTANEOUS | Status: DC
  Administered 2017-03-13 – 2017-03-14 (×3): 1 [IU] via SUBCUTANEOUS
  Administered 2017-03-15 (×3): 2 [IU] via SUBCUTANEOUS
  Administered 2017-03-16 (×2): 1 [IU] via SUBCUTANEOUS
  Administered 2017-03-16 – 2017-03-17 (×2): 2 [IU] via SUBCUTANEOUS
  Administered 2017-03-17: 3 [IU] via SUBCUTANEOUS
  Administered 2017-03-17 – 2017-03-18 (×4): 2 [IU] via SUBCUTANEOUS
  Administered 2017-03-19 (×3): 1 [IU] via SUBCUTANEOUS
  Administered 2017-03-20 – 2017-03-21 (×4): 2 [IU] via SUBCUTANEOUS
  Administered 2017-03-21: 1 [IU] via SUBCUTANEOUS
  Administered 2017-03-21: 2 [IU] via SUBCUTANEOUS

## 2017-03-12 MED ORDER — SODIUM CHLORIDE 0.9 % IV BOLUS (SEPSIS)
500.0000 mL | Freq: Once | INTRAVENOUS | Status: AC
  Administered 2017-03-12: 500 mL via INTRAVENOUS

## 2017-03-12 MED ORDER — LEVOTHYROXINE SODIUM 100 MCG IV SOLR
200.0000 ug | Freq: Once | INTRAVENOUS | Status: AC
  Administered 2017-03-12: 200 ug via INTRAVENOUS
  Filled 2017-03-12: qty 10

## 2017-03-12 MED ORDER — ENOXAPARIN SODIUM 100 MG/ML ~~LOC~~ SOLN
90.0000 mg | SUBCUTANEOUS | Status: DC
  Administered 2017-03-12 – 2017-03-14 (×3): 90 mg via SUBCUTANEOUS
  Filled 2017-03-12 (×3): qty 1

## 2017-03-12 MED ORDER — SODIUM CHLORIDE 0.9 % IV BOLUS (SEPSIS)
250.0000 mL | Freq: Once | INTRAVENOUS | Status: AC
  Administered 2017-03-12: 250 mL via INTRAVENOUS

## 2017-03-12 MED ORDER — SODIUM CHLORIDE 0.9 % IV SOLN: Freq: Once | INTRAVENOUS | Status: DC

## 2017-03-12 MED ORDER — LIOTHYRONINE SODIUM 25 MCG PO TABS
25.0000 ug | ORAL_TABLET | Freq: Once | ORAL | Status: AC
  Administered 2017-03-12: 25 ug via ORAL
  Filled 2017-03-12: qty 1

## 2017-03-12 MED ORDER — RESOURCE THICKENUP CLEAR PO POWD
ORAL | Status: DC | PRN
  Filled 2017-03-12: qty 125

## 2017-03-12 NOTE — Progress Notes (Signed)
[place] external urinary catheter changed pericare performed

## 2017-03-12 NOTE — Progress Notes (Signed)
STROKE TEAM PROGRESS NOTE   SUBJECTIVE (INTERVAL HISTORY) Her family is at the bedside.  The patient more awake alert and answer questions appropriately. She is eating ice cream with assistance during rounds. However, still has significant asterixis b/l UEs.     OBJECTIVE Temp:  [97.4 F (36.3 C)-97.5 F (36.4 C)] 97.5 F (36.4 C) (06/20 1514) Pulse Rate:  [30-127] 30 (06/20 1514) Cardiac Rhythm: Atrial fibrillation (06/20 0846) Resp:  [12-31] 16 (06/20 1514) BP: (86-145)/(61-100) 125/96 (06/20 1514) SpO2:  [90 %-100 %] 100 % (06/20 1514) Weight:  [205 lb 0.4 oz (93 kg)] 205 lb 0.4 oz (93 kg) (06/20 0425)  CBC:  Recent Labs Lab 03/10/17 1509  03/11/17 1633 03/12/17 0219  WBC 6.5  < > 9.8 7.9  NEUTROABS 4.7  --   --   --   HGB 8.3*  < > 8.8* 8.7*  HCT 25.0*  < > 26.8* 27.0*  MCV 105.9*  < > 107.2* 108.0*  PLT 105*  < > 142* 121*  < > = values in this interval not displayed.  Basic Metabolic Panel:   Recent Labs Lab 03/11/17 1028 03/11/17 1859  NA 139 140  K 5.6* 5.5*  CL 103 103  CO2 23 21*  GLUCOSE 110* 83  BUN 58* 58*  CREATININE 2.78* 2.89*  CALCIUM 8.8* 8.7*    Lipid Panel:     Component Value Date/Time   CHOL 115 03/11/2017 0543   TRIG 98 03/11/2017 0543   HDL 29 (L) 03/11/2017 0543   CHOLHDL 4.0 03/11/2017 0543   VLDL 20 03/11/2017 0543   LDLCALC 66 03/11/2017 0543   HgbA1c:  Lab Results  Component Value Date   HGBA1C 5.8 (H) 09/29/2016   Urine Drug Screen: No results found for: LABOPIA, COCAINSCRNUR, LABBENZ, AMPHETMU, THCU, LABBARB  Alcohol Level No results found for: [facility]  IMAGING I have personally reviewed the radiological images below and agree with the radiology interpretations. Read text is my interpretation  2D Echo 01/29/2017 Study Conclusions - Left ventricle: Systolic function was severely reduced. The   estimated ejection fraction was in the range of 25% to 30%.  Diffuse hypokinesis. - Regional wall motion abnormality: Akinesis of  the mid anterior and basal-mid anteroseptal myocardium. Diastolic function is abnormal, indeterminant grade. - Aortic valve: Moderately calcified annulus. Trileaflet; mildly thickened leaflets. Valve area (VTI): 1.65 cm^2. Valve area   (Vmax): 1.87 cm^2. Valve area (Vmean): 1.73 cm^2.  Mitral valve: Moderately calcified annulus. Mildly thickened   leaflets . - Left atrium: The atrium was severely dilated. - Right ventricle: The cavity size was mildly to moderately dilated. Systolic function was mildly reduced. - Right atrium: The atrium was severely dilated. - Atrial septum: There was a patent foramen ovale. - Tricuspid valve: There was moderate-severe regurgitation. - Pulmonary arteries: Systolic pressure was moderately increased.   PA peak pressure: 50 mm Hg (S). - Pericardium, extracardiac: There is a moderate sized left pleural effusion. There is a trivial circumferential pericardial effusion. - Ordered as limited study, additional imaging done for the study to further evaluate new cardiac dysfunction detected.  Dg Chest 1 View 03/10/2017 IMPRESSION: Chronic left lower lobe atelectasis and/or pneumonia and left pleural effusion. Stable cardiomegaly. No new abnormalities since 01/14/2017.   Ct Head Wo Contrast 03/10/2017 IMPRESSION: Recent appearing infarct in the inferior right cerebellar hemisphere. Suspect infarct involving a portion of the right posterior inferior cerebral artery distribution. No other acute appearing infarct evident. There is underlying atrophy with mild periventricular small  vessel disease. There is no mass, hemorrhage, or extra-axial fluid collection. There are foci of arterial calcification in the anterior and posterior circulations. There is paranasal sinus disease, most marked in the right sphenoid sinus region. However, it is not convinced to me that there is right cerebellum hypodensity.  Dg Hip Unilat W Or Wo Pelvis 2-3 Views Left 03/10/2017 IMPRESSION: No acute  fracture or dislocation. Mild narrowing of both hip joints. Myositis ossifications superior to the left greater trochanter. Postoperative change on the left. Extensive aortoiliac and femoral artery atherosclerosis.  Dg Femur Min 2 Views Left 03/10/2017 IMPRESSION: Comminuted fracture distal femoral metaphysis with posterior displacement of distal major fracture fragment with respect to proximal major fragment. Advanced arthropathy in the knee joint without frank dislocation. There is a moderate joint effusion. Postoperative change proximally without acute fracture or dislocation proximally. There is extensive femoral artery atherosclerosis.   Mri and Mra Head Wo Contrast 03/12/2017 IMPRESSION: MRI HEAD IMPRESSION: 1. Moderately motion degraded study. 2. No acute intracranial infarct or other process identified. 3. Generalized age-related cerebral atrophy. MRA HEAD IMPRESSION: 1. Severely limited study due to motion. 2. No large or proximal arterial branch occlusion within the intracranial circulation. No obvious high-grade or correctable stenosis. Further delineation of the intracranial vasculature fairly limited on this motion degraded exam.   CUS Technically difficult due to cardiac arrhythmia, respiratory interference. And anatomy. Velocities indicate a 1% to 39% ICA stenosis however above mentioned difficulties may have obscured hight velocities and level of stenosis.bilaterally. Vertebral artery flow is antegrade bilaterally.   PHYSICAL EXAM  Temp:  [97.4 F (36.3 C)-97.5 F (36.4 C)] 97.5 F (36.4 C) (06/20 1514) Pulse Rate:  [30-127] 30 (06/20 1514) Resp:  [12-31] 16 (06/20 1514) BP: (86-145)/(61-100) 125/96 (06/20 1514) SpO2:  [90 %-100 %] 100 % (06/20 1514) Weight:  [205 lb 0.4 oz (93 kg)] 205 lb 0.4 oz (93 kg) (06/20 0425)  General - Well nourished, well developed, sitting in bed eating ice cream.  Ophthalmologic - Fundi not visualized due to noncooperation.  Cardiovascular -  irregularly irregular heart rate and rhythm.  Neuro - awake alert, orientated to place, people, however, not to time. Hard of hearing, mild dysarthria, able to follow simple commands. PERRL, blinking to visual threat bilaterally, no significant facial droop, tongue midline. Moving both arms and right lower extremity spontaneously, however, left lower extremity on brace due to fracture. DTR 1+, no Babinski. Significant asterixis bilateral UE noted at rest and arm lifting up. Sensation symmetrical, coordination not cooperative and gait not tested.    ASSESSMENT/PLAN Glenda Dickson is a 83 y.o. female with history of atrial fibrillation, HTN, HLD, remote left hip fracture, DM, Dementia/Depression, CHF (EF 25-30%) presenting with slurred speech and a comminuted distal left femur fracture believed to have happened subsequent to her stroke. She did not receive IV t-PA because the patient takes coumadin for atrial fibrillation    Encephalopathy - could be due to AKI on CKD  Bilateral significant asterixis  Cre 1.50 baseline -> 2.41 -> 2.78  Hyperkalemia 5.3->5.6  Treat for underlying disease  Anemia  Hb 11.8 in 12/2016, this admission 8.3->8.6->8.2->8.9->8.8->8.7  Stable  Stool guaiac pending  No GI bleeding as per family  Anemia likely due to CKD instead of acute blood loss  Chronic Atrial fibrillation  On Coumadin at home  INR at admission 2.13 ->1.55  Reversed with Concentra due to concerns of anemia and possibility of surgical procedure  Recommend to resume Coumadin as anemia stable  and no orthopedic procedure planned  No acute stroke  MRI head no acute stroke  MRA head motion degraded, however no LVO  Carotid Doppler: Technically difficult but no significant ICA stenosis  2D Echo EF 25-30% in 01/2017, PFO  LDL 66  HgbA1c pending   SCDs for VTE prophylaxis DIET DYS 2 Room service appropriate? Yes; Fluid consistency: Nectar Thick  warfarin daily prior to  admission, now on No antithrombotic due to anemia. Recommend to resume Coumadin for stroke prevention   Patient counseled to be compliant with her antithrombotic medications  Ongoing aggressive stroke risk factor management  Therapy recommendations: pending  Disposition: pending  Left distal comminuted femur fracture  Orthopedic on board  Left lower extremity on brace  No procedure planned by orthopedics  Hyperlipidemia  Home meds: atorvastatin 40 mg PO daily, resumed today  LDL 66, goal < 70  Continue statin at discharge  Diabetes  HgbA1c 5.8, goal < 7.0  Controlled  SSI  Other Stroke Risk Factors  Advanced age  Obesity, Body mass index is 34.12 kg/m., recommend weight loss, diet and exercise as appropriate   Coronary artery disease/ischemic cardiomyopathy  HFrEF (25-35%)  Other Active Problems  Hyperkalemia  Chronic kidney disease, Stage 4  Dementia  Baseline home most of time wheelchair-bound, need assistance for short distance walking  Hypothyroidism - on [facility]  Hospital day # 2  Neurology will sign off. Please call with questions. No neurology follow-up needed at this time. Thanks for the consult.   Marvel Plan, MD PhD Stroke Neurology 03/12/2017 4:31 PM    To contact Stroke Continuity provider, please refer to [email or web address]. After hours, contact General Neurology

## 2017-03-12 NOTE — Evaluation (Signed)
Clinical/Bedside Swallow Evaluation Patient Details  Name: Glenda Dickson MRN: [id number] Date of Birth: 09-26-1927  Today's Date: 03/12/2017 Time: SLP Start Time (ACUTE ONLY): 4098 SLP Stop Time (ACUTE ONLY): 1191 SLP Time Calculation (min) (ACUTE ONLY): 17 min  Past Medical History:  Past Medical History:  Diagnosis Date  . Anemia, normocytic normochromic 06/26/2012   H&H-11.2/33.7, normal MCV in 06/2012   . Atrial fibrillation (HCC)    on anticoagulation  . CHF (congestive heart failure) (HCC)   . Dementia 10/2016   Aricept did not help, mood stabilizer did  . Depression   . Diabetes mellitus type II    no insulin  . Fracture of left hip (HCC) 2006   intertrochanteric; ORIF - repaired by [facility]  . Hyperlipidemia   . Hypertension    Past Surgical History:  Past Surgical History:  Procedure Laterality Date  . ABDOMINAL HYSTERECTOMY    . COLONOSCOPY  2003   Negative screening study.  [facility] HIP FRACTURE SURGERY     Fixation   HPI:  Pt is an 83 year old female arriving after a fall in ALF. Founds to have cerebellar infarct, Closed comminuted intra-articular fracture of distal femur, left, initial encounter, Acute renal failure. Non operative courase decided for femur fx. Daughter reports a history of coughing with liquids, recent pneumonia though CHF involved.    Assessment / Plan / Recommendation Clinical Impression  Pt presents with a mild baseline dysphagia with increased risk of aspiration events given altered mental status and generalized weakness and increased risk of infections process given loss of functional reserve with injury and illness. Pt demonstrates ability to swallow, but cannot feed herself and has decreased awareness of cup and straw, bites of food requiring verbal and tactile cueing. Pt did choke with water with initial sips due to lack of awareness of bolus. With assist and cueing, pt initiates sips and can take small sips without  coughing. Expect intermittent coughing episodes, which may be less likely with slower moving nectar thick textures. Family does not want any objective testing at this time as they want to limit pain and difficult interventions, but they would like to limit reduceof aspiration. Recommend attempting a nectar thick/ dys 2 (fine chop) diet with full supervision. Pills whole in puree. Will follow for tolerance and potential upgrade as pt progresses. Daughter in agreement with plan.  SLP Visit Diagnosis: Dysphagia, oropharyngeal phase (R13.12)    Aspiration Risk  Moderate aspiration risk    Diet Recommendation Dysphagia 2 (Fine chop);Nectar-thick liquid   Liquid Administration via: Cup;Straw Medication Administration: Whole meds with puree Supervision: Full supervision/cueing for compensatory strategies Compensations: Slow rate;Small sips/bites Postural Changes: Seated upright at 90 degrees;Remain upright for at least 30 minutes after po intake    Other  Recommendations Oral Care Recommendations: Oral care BID   Follow up Recommendations Skilled Nursing facility      Frequency and Duration min 2x/week  2 weeks       Prognosis        Swallow Study   General HPI: Pt is an 83 year old female arriving after a fall in ALF. Founds to have cerebellar infarct, Closed comminuted intra-articular fracture of distal femur, left, initial encounter, Acute renal failure. Non operative courase decided for femur fx. Daughter reports a history of coughing with liquids, recent pneumonia though CHF involved.  Type of Study: Bedside Swallow Evaluation Diet Prior to this Study: NPO Temperature Spikes Noted: No Respiratory Status: Room air History of Recent  Intubation: No Behavior/Cognition: Alert;Distractible;Requires cueing Oral Cavity Assessment: Within Functional Limits Oral Care Completed by SLP: No Oral Cavity - Dentition: Poor condition;Missing dentition;Dentures, top;Dentures, bottom (dentures  available, not in place) Vision: Impaired for self-feeding Self-Feeding Abilities: Needs assist Patient Positioning: Upright in bed Baseline Vocal Quality: Low vocal intensity Volitional Cough: Cognitively unable to elicit Volitional Swallow: Unable to elicit    Oral/Motor/Sensory Function Overall Oral Motor/Sensory Function: Generalized oral weakness   Ice Chips     Thin Liquid Thin Liquid: Impaired Presentation: Cup;Self Fed Pharyngeal  Phase Impairments: Cough - Immediate    Nectar Thick Nectar Thick Liquid: Not tested   Honey Thick Honey Thick Liquid: Not tested   Puree Puree: Within functional limits   Solid   GO   Solid: Impaired Oral Phase Impairments: Impaired mastication Oral Phase Functional Implications: Impaired mastication;Prolonged oral transit       Harlon DittyBonnie Wenceslaus Gist, MA CCC-SLP [phone]  Ziere Docken, Riley NearingBonnie Caroline 03/12/2017,10:57 AM

## 2017-03-12 NOTE — Progress Notes (Signed)
In and out cath per dr Konrad Doloresmerrell for u/a and urine cx. In and out cath results of 220cc dark amber urine.

## 2017-03-12 NOTE — Progress Notes (Signed)
Orthopaedic Trauma Service (OTS)      Subjective: Patient more alert and communicating some. Family at bedside. Decision made for nonoperative treatment which has been recommended.   Objective: Current Vitals Blood pressure 127/71, pulse (!) 127, temperature 97.5 F (36.4 C), temperature source Axillary, resp. rate 17, height 5\' 5"  (1.651 m), weight 93 kg (205 lb 0.4 oz), SpO2 90 %. Vital signs in last 24 hours: Temp:  [97.5 F (36.4 C)] 97.5 F (36.4 C) (06/20 0721) Pulse Rate:  [82-127] 127 (06/20 0800) Resp:  [13-31] 17 (06/20 0800) BP: (83-145)/(55-100) 127/71 (06/20 0800) SpO2:  [71 %-98 %] 90 % (06/20 0800) Weight:  [93 kg (205 lb 0.4 oz)] 93 kg (205 lb 0.4 oz) (06/20 0425)  Intake/Output from previous day: 06/19 0701 - 06/20 0700 In: 3100 [I.V.:1975; IV Piggyback:125] Out: -   LABS  Recent Labs  03/10/17 2316 03/11/17 0543 03/11/17 1028 03/11/17 1633 03/12/17 0219  HGB 8.6* 8.2* 8.9* 8.8* 8.7*    Recent Labs  03/11/17 1633 03/12/17 0219  WBC 9.8 7.9  RBC 2.50* 2.50*  HCT 26.8* 27.0*  PLT 142* 121*    Recent Labs  03/11/17 1028 03/11/17 1859  NA 139 140  K 5.6* 5.5*  CL 103 103  CO2 23 21*  BUN 58* 58*  CREATININE 2.78* 2.89*  GLUCOSE 110* 83  CALCIUM 8.8* 8.7*    Recent Labs  03/11/17 1900 03/12/17 0219  INR 1.57 1.56     Physical Exam LLE Ace wraps and kerlix applied with knee immobilizer repositioned  Edema/ swelling controlled  Sens: unable to assess  Motor: some spontaneous motion but unable to specifically assess  Brisk cap refill, warm to touch  Assessment/Plan:   Left distal femur supracondylar fracture; will treat nonoperatively. Appreciate other service input.  MRI appears to show no stroke and patent vessels.  Cr worsening somewhat.  1. PT/OT NWB left lower; maintain knee immobilizer for first four weeks or so then will transition into hinged knee brace 2. DVT proph could be started at this time 3. F/u 8-14 days  post discharge for new xrays.  Myrene GalasMichael Stedman [place], MD [facility] [phone] (p)

## 2017-03-12 NOTE — Clinical Social Work Note (Signed)
CSW acknowledges second SNF consult. CSW paged MD this morning to have him put in PT order for evaluation for SNF.  Charlynn CourtSarah Daliah Chaudoin, CSW [phone]

## 2017-03-12 NOTE — Plan of Care (Signed)
Problem: Education: Goal: Knowledge of disease or condition will improve Outcome: Not Progressing Patient with decreased mentation unable to do teaching at this time

## 2017-03-12 NOTE — Progress Notes (Signed)
ANTICOAGULATION CONSULT NOTE - Initial Consult  Pharmacy Consult for Lovenox >> Coumadin Indication: atrial fibrillation  Allergies  Allergen Reactions  . Benzalkonium Chloride-Alcohol Other (See Comments)    Patient/family is not familiar with this allergy  . Codeine Itching and Nausea Only    Patient Measurements: Height: 5\' 5"  (165.1 cm) Weight: 205 lb 0.4 oz (93 kg) IBW/kg (Calculated) : 57  Vital Signs: Temp: 97.5 F (36.4 C) (06/20 1514) Temp Source: Axillary (06/20 1514) BP: 112/61 (06/20 1600) Pulse Rate: 30 (06/20 1514)  Labs:  Recent Labs  03/10/17 1509  03/11/17 1028 03/11/17 1147 03/11/17 1633 03/11/17 1859 03/11/17 1900 03/12/17 0219  HGB 8.3*  < > 8.9*  --  8.8*  --   --  8.7*  HCT 25.0*  < > 27.7*  --  26.8*  --   --  27.0*  PLT 105*  < > 121*  --  142*  --   --  121*  LABPROT 24.2*  < >  --  18.8*  --   --  18.9* 18.8*  INR 2.13  < >  --  1.55  --   --  1.57 1.56  CREATININE 2.41*  --  2.78*  --   --  2.89*  --   --   TROPONINI  --   --   --   --   --   --   --  0.06*  < > = values in this interval not displayed.  Estimated Creatinine Clearance: 15.8 mL/min (A) (by C-G formula based on SCr of 2.89 mg/dL (H)).   Medical History: Past Medical History:  Diagnosis Date  . Anemia, normocytic normochromic 06/26/2012   H&H-11.2/33.7, normal MCV in 06/2012   . Atrial fibrillation (HCC)    on anticoagulation  . CHF (congestive heart failure) (HCC)   . Dementia 10/2016   Aricept did not help, mood stabilizer did  . Depression   . Diabetes mellitus type II    no insulin  . Fracture of left hip (HCC) 2006   intertrochanteric; ORIF - repaired by [facility]  . Hyperlipidemia   . Hypertension     Medications:  Scheduled:  . atorvastatin  40 mg Oral QPM  . chlorhexidine  15 mL Mouth Rinse BID  . [START ON 03/13/2017] insulin aspart  0-9 Units Subcutaneous TID WC  . [START ON 03/13/2017] levothyroxine  50 mcg Intravenous Daily  . [START ON  03/13/2017] liothyronine  5 mcg Oral Q8H  . mouth rinse  15 mL Mouth Rinse BID   Infusions:  . sodium chloride 75 mL/hr at 03/12/17 1656  . methocarbamol (ROBAXIN)  IV Stopped (03/11/17 0240)    Assessment: 83 yo F presented to [facility] with leg pain.  Initial imaging concerning for CVA and distal femur fx.  INR was reversed with Kcentra and 10mg  IV Vit K.    [facility], family has elected for nonsurgical mgmt of fracture and patient has r/o for CVA (MRI/MRA negative).  Pharmacy asked to start Lovenox (treatment dose) and Coumadin for hx afib.  Note, INR will likely be slow to respond given recent Vit K.  Coumadin dose PTA was 3.75,g daily except 2.5mg  on TTSun  Goal of Therapy:  INR 2-3 Anti-Xa level 0.6-1 units/ml 4hrs after LMWH dose given Monitor platelets by anticoagulation protocol: Yes   Plan:  Lovenox 90mg  SQ q24h (once daily for CrCl <30). Coumadin 4mg  PO x 1 tonight Daily INR CBC q72 hours  Toys 'R' UsKimberly Frederica Chrestman,  Pharm.D., BCPS Clinical Pharmacist Pager: [phone] 03/12/2017 5:07 PM

## 2017-03-12 NOTE — Progress Notes (Signed)
[place] TEAM 1 - Stepdown/ICU TEAM  Jeani SowSarah T Checketts  YQM:[id number]:[id number] DOB: 20-Oct-1927 DOA: 03/10/2017 PCP: [name], [name], MD    Brief Narrative:  83 y.o. female ALF resident with history of HTN, HLD, remote left hip fracture, DM, Dementia, Depression, Systolic CHF (EF 52-84%25-30% 5/18), Chronic anemia (baseline appears to be 11-12), and Afib on Coumadin who presented to [facility] ED with leg pain after several falls, as well as 24hrs of confusion and lethargy.    [facility] ED eval noted a new L cerebellar CVA, and xrays noted a comminuted fx of the distal femor metaphysis on the L.  [facility].    Subjective: The patient is somnolent and lethargic at the time of my visit.  The patient's family reports that she has waxed and waned today with episodes during which she is very interactive and was able to eat.  She does not presently appear to be in respiratory distress nor does she appear to be suffering with uncontrolled pain.  Assessment & Plan:  Severe Hypothyroidism / ?Myxedema coma  TSH severely elevated at 171 - ?lab error - Free T4 is low at 0.35 - pt is relatively hypothermic and hypotensive - will tx for myxedema and recheck TSH in AM for confirmation - utilize T4 and T3 until sx greatly improved then transition to T4 only - cortisol is normal therefore hydrocortisone not indicated   ?Cerebellar CVA - ruled out by MRI Stroke Team is directing care - it is possible her wavering mental status may be due to myxedema - follow w/ tx   Chronic systolic CHF - EF 25-30% via TTE May 2018 No evidence of signif volume overload at present - follow Is/Os and daily weights Filed Weights   03/10/17 2256 03/11/17 2300 03/12/17 0425  Weight: 92.8 kg (204 lb 9.4 oz) 93 kg (205 lb 0.4 oz) 93 kg (205 lb 0.4 oz)    AKI on CKD stage 4 Baseline crt 1.68 - likely pre-renal azotemia due to intermittent hypotension - follow w/ tx of myxedema and volume expansion - avoid  ACEi/ARB  Recent Labs Lab 03/10/17 1509 03/11/17 1028 03/11/17 1859  CREATININE 2.41* 2.78* 2.89*   Hypotension Possibly due to myxedema - follow w/ tx for same - keep hydrated   Hyperbilirubinemia - ?Cirrhosis  Follow w/ hydration   DM2 CBG currently well-controlled  Chronic Atrial Fib CHA2DS2 - VASc is 8 - resume anticoagulation (discussed with family) - follow rate on telemetry with relatively high dose thyroid replacement therapy being initiated  Left distal femur fracture Care as per Orthopedics - no plans for the OR at present  Dementia  Family clarifies the patient had no cognitive deficits until early this year when they developed suddenly - follow clinically with treatment of myxedema  Chronic anemia  Most likely related to chronic kidney disease   DVT prophylaxis: lovenox > warfarin  Code Status: FULL CODE Family Communication: Spoke with daughter and nephew at bedside Disposition Plan: SDU  Consultants:  Orthopedics Neurology  Procedures: None  Antimicrobials:  None   Objective: Blood pressure (!) 125/96, pulse (!) 30, temperature 97.5 F (36.4 C), temperature source Axillary, resp. rate 16, height 5\' 5"  (1.651 m), weight 93 kg (205 lb 0.4 oz), SpO2 100 %.  Intake/Output Summary (Last 24 hours) at 03/12/17 1615 Last data filed at 03/12/17 1100  Gross per 24 hour  Intake  2340 ml  Output              220 ml  Net             2120 ml   Filed Weights   03/10/17 2256 03/11/17 2300 03/12/17 0425  Weight: 92.8 kg (204 lb 9.4 oz) 93 kg (205 lb 0.4 oz) 93 kg (205 lb 0.4 oz)    Examination: General: No acute respiratory distress - lethargic presently  Lungs: Clear to auscultation bilaterally without wheezes or crackles Cardiovascular: Irregular and modestly tachycardic without appreciable murmur Abdomen: Nondistended, soft, bowel sounds positive, no rebound, no ascites, no appreciable mass Extremities: No significant cyanosis, clubbing,  or edema bilateral lower extremities  CBC:  Recent Labs Lab 03/10/17 1509 03/10/17 2316 03/11/17 0543 03/11/17 1028 03/11/17 1633 03/12/17 0219  WBC 6.5 7.5 8.1 7.4 9.8 7.9  NEUTROABS 4.7  --   --   --   --   --   HGB 8.3* 8.6* 8.2* 8.9* 8.8* 8.7*  HCT 25.0* 27.0* 26.2* 27.7* 26.8* 27.0*  MCV 105.9* 107.6* 106.1* 106.9* 107.2* 108.0*  PLT 105* 130* 117* 121* 142* 121*   Basic Metabolic Panel:  Recent Labs Lab 03/10/17 1509 03/11/17 1028 03/11/17 1859  NA 138 139 140  K 5.3* 5.6* 5.5*  CL 99* 103 103  CO2 26 23 21*  GLUCOSE 160* 110* 83  BUN 55* 58* 58*  CREATININE 2.41* 2.78* 2.89*  CALCIUM 9.1 8.8* 8.7*   GFR: Estimated Creatinine Clearance: 15.8 mL/min (A) (by C-G formula based on SCr of 2.89 mg/dL (H)).  Liver Function Tests:  Recent Labs Lab 03/10/17 1509 03/11/17 1859  AST 42* 56*  ALT 29 35  ALKPHOS 124 120  BILITOT 4.4* 5.4*  PROT 5.6* 5.5*  ALBUMIN 3.4* 3.1*    Recent Labs Lab 03/11/17 1859  LIPASE 20   Coagulation Profile:  Recent Labs Lab 03/10/17 2316 03/11/17 0543 03/11/17 1147 03/11/17 1900 03/12/17 0219  INR 1.39 1.51 1.55 1.57 1.56    Cardiac Enzymes:  Recent Labs Lab 03/12/17 0219  TROPONINI 0.06*    HbA1C: Hgb A1c MFr Bld  Date/Time Value Ref Range Status  09/29/2016 04:32 PM 5.8 (H) 4.8 - 5.6 % Final    Comment:    (NOTE)         Pre-diabetes: 5.7 - 6.4         Diabetes: >6.4         Glycemic control for adults with diabetes: <7.0     CBG:  Recent Labs Lab 03/11/17 2251 03/12/17 0257 03/12/17 0725 03/12/17 1305 03/12/17 1513  GLUCAP 91 103* 97 118* 100*    Recent Results (from the past 240 hour(s))  Surgical pcr screen     Status: None   Collection Time: 03/10/17 11:18 PM  Result Value Ref Range Status   MRSA, PCR NEGATIVE NEGATIVE Final   Staphylococcus aureus NEGATIVE NEGATIVE Final    Comment:        The Xpert SA Assay (FDA approved for NASAL specimens in patients over 21 years of  age), is one component of a comprehensive surveillance program.  Test performance has been validated by [facility] for patients greater than or equal to 29 year old. It is not intended to diagnose infection nor to guide or monitor treatment.      Scheduled Meds: . chlorhexidine  15 mL Mouth Rinse BID  . heparin subcutaneous  5,000 Units Subcutaneous Q8H  . insulin aspart  0-9 Units Subcutaneous  Q4H  . levothyroxine  200 mcg Intravenous Once  . [START ON 03/13/2017] levothyroxine  50 mcg Intravenous Daily  . liothyronine  25 mcg Oral Once  . [START ON 03/13/2017] liothyronine  5 mcg Oral Q8H  . mouth rinse  15 mL Mouth Rinse BID  . mouth rinse  15 mL Mouth Rinse q12n4p     LOS: 2 days   Lonia Blood, MD [facility] Office  [phone] Pager - Text Page per Amion as per below:  On-Call/Text Page:      [email or web address]      password [email or web address]  If 7PM-7AM, please contact night-coverage [email or web address] Password [email or web address] 03/12/2017, 4:15 PM

## 2017-03-12 NOTE — Progress Notes (Signed)
Pharmacy Antibiotic Note  Glenda Dickson is a 83 y.o. female admitted on 03/10/2017 with UTI.  Pharmacy has been consulted for Ceftriaxone dosing.  Plan: -Ceftriaxone 1g IV q24h -F/U urine culture for directed therapy  Height: 5\' 5"  (165.1 cm) Weight: 204 lb 9.4 oz (92.8 kg) IBW/kg (Calculated) : 57  Temp (24hrs), Avg:97.6 F (36.4 C), Min:97.5 F (36.4 C), Max:97.6 F (36.4 C)   Recent Labs Lab 03/10/17 1509 03/10/17 2316 03/11/17 0543 03/11/17 1028 03/11/17 1633 03/11/17 1845 03/11/17 1859  WBC 6.5 7.5 8.1 7.4 9.8  --   --   CREATININE 2.41*  --   --  2.78*  --   --  2.89*  LATICACIDVEN  --   --   --   --   --  3.4*  --     Estimated Creatinine Clearance: 15.7 mL/min (A) (by C-G formula based on SCr of 2.89 mg/dL (H)).     Abran DukeLedford, Dragon Thrush 03/12/2017 12:30 AM

## 2017-03-12 NOTE — Progress Notes (Signed)
Paged Craige CottaKirby NP regarding pt's  low urine output. Bladder scanned for only 41cc.

## 2017-03-12 NOTE — Progress Notes (Signed)
CRITICAL VALUE ALERT  Critical Value:  Lactic Acid 3.5 Trop I 0.06  Date & Time Notied:  03/12/17 , 0340  Provider Notified: Dr Toniann FailKakrakandy  Orders Received/Actions taken: 250 ml Bolus ordered

## 2017-03-13 ENCOUNTER — Other Ambulatory Visit (HOSPITAL_COMMUNITY): Payer: Medicare Other

## 2017-03-13 ENCOUNTER — Inpatient Hospital Stay (HOSPITAL_COMMUNITY): Payer: Medicare Other

## 2017-03-13 LAB — CBC
HCT: 25.5 % — ABNORMAL LOW (ref 36.0–46.0)
HEMOGLOBIN: 8.2 g/dL — AB (ref 12.0–15.0)
MCH: 34.6 pg — AB (ref 26.0–34.0)
MCHC: 32.2 g/dL (ref 30.0–36.0)
MCV: 107.6 fL — ABNORMAL HIGH (ref 78.0–100.0)
PLATELETS: 131 10*3/uL — AB (ref 150–400)
RBC: 2.37 MIL/uL — AB (ref 3.87–5.11)
RDW: 23.7 % — ABNORMAL HIGH (ref 11.5–15.5)
WBC: 8.3 10*3/uL (ref 4.0–10.5)

## 2017-03-13 LAB — COMPREHENSIVE METABOLIC PANEL
ALBUMIN: 2.8 g/dL — AB (ref 3.5–5.0)
ALK PHOS: 104 U/L (ref 38–126)
ALT: 71 U/L — ABNORMAL HIGH (ref 14–54)
ANION GAP: 11 (ref 5–15)
AST: 114 U/L — ABNORMAL HIGH (ref 15–41)
BILIRUBIN TOTAL: 6.8 mg/dL — AB (ref 0.3–1.2)
BUN: 64 mg/dL — ABNORMAL HIGH (ref 6–20)
CALCIUM: 8 mg/dL — AB (ref 8.9–10.3)
CO2: 25 mmol/L (ref 22–32)
Chloride: 105 mmol/L (ref 101–111)
Creatinine, Ser: 3.45 mg/dL — ABNORMAL HIGH (ref 0.44–1.00)
GFR calc Af Amer: 13 mL/min — ABNORMAL LOW (ref >60)
GFR, EST NON AFRICAN AMERICAN: 11 mL/min — AB (ref >60)
GLUCOSE: 153 mg/dL — AB (ref 65–99)
POTASSIUM: 5.3 mmol/L — AB (ref 3.5–5.1)
Sodium: 141 mmol/L (ref 135–145)
TOTAL PROTEIN: 4.9 g/dL — AB (ref 6.5–8.1)

## 2017-03-13 LAB — GLUCOSE, CAPILLARY
GLUCOSE-CAPILLARY: 121 mg/dL — AB (ref 65–99)
GLUCOSE-CAPILLARY: 145 mg/dL — AB (ref 65–99)
Glucose-Capillary: 136 mg/dL — ABNORMAL HIGH (ref 65–99)
Glucose-Capillary: 109 mg/dL — ABNORMAL HIGH (ref 65–99)

## 2017-03-13 LAB — BASIC METABOLIC PANEL
ANION GAP: 10 (ref 5–15)
BUN: 65 mg/dL — ABNORMAL HIGH (ref 6–20)
CHLORIDE: 106 mmol/L (ref 101–111)
CO2: 24 mmol/L (ref 22–32)
Calcium: 7.8 mg/dL — ABNORMAL LOW (ref 8.9–10.3)
Creatinine, Ser: 3.66 mg/dL — ABNORMAL HIGH (ref 0.44–1.00)
GFR calc non Af Amer: 10 mL/min — ABNORMAL LOW (ref >60)
GFR, EST AFRICAN AMERICAN: 12 mL/min — AB (ref >60)
GLUCOSE: 135 mg/dL — AB (ref 65–99)
Potassium: 4.8 mmol/L (ref 3.5–5.1)
Sodium: 140 mmol/L (ref 135–145)

## 2017-03-13 LAB — AMMONIA
AMMONIA: 30 umol/L (ref 9–35)
Ammonia: 25 umol/L (ref 9–35)

## 2017-03-13 LAB — IRON AND TIBC
Iron: 50 ug/dL (ref 28–170)
Saturation Ratios: 18 % (ref 10.4–31.8)
TIBC: 281 ug/dL (ref 250–450)
UIBC: 231 ug/dL

## 2017-03-13 LAB — PROTIME-INR
INR: 1.76
PROTHROMBIN TIME: 20.7 s — AB (ref 11.4–15.2)

## 2017-03-13 LAB — RETICULOCYTES
RBC.: 2.37 MIL/uL — AB (ref 3.87–5.11)
RETIC CT PCT: 6.1 % — AB (ref 0.4–3.1)
Retic Count, Absolute: 144.6 10*3/uL (ref 19.0–186.0)

## 2017-03-13 LAB — HEMOGLOBIN A1C
Hgb A1c MFr Bld: 6 % — ABNORMAL HIGH (ref 4.8–5.6)
Mean Plasma Glucose: 126 mg/dL

## 2017-03-13 LAB — SODIUM, URINE, RANDOM: Sodium, Ur: 32 mmol/L

## 2017-03-13 LAB — FOLATE: FOLATE: 17.1 ng/mL (ref >5.9)

## 2017-03-13 LAB — URINE CULTURE: CULTURE: NO GROWTH

## 2017-03-13 LAB — VITAMIN B12: VITAMIN B 12: 1845 pg/mL — AB (ref 180–914)

## 2017-03-13 LAB — T3: T3 TOTAL: 21 ng/dL — AB (ref 71–180)

## 2017-03-13 LAB — FERRITIN: Ferritin: 305 ng/mL (ref 11–307)

## 2017-03-13 LAB — TSH: TSH: 169.681 u[IU]/mL — AB (ref 0.350–4.500)

## 2017-03-13 MED ORDER — BISACODYL 10 MG RE SUPP
10.0000 mg | Freq: Every day | RECTAL | Status: DC | PRN
  Administered 2017-03-13: 10 mg via RECTAL
  Filled 2017-03-13: qty 1

## 2017-03-13 MED ORDER — POLYETHYLENE GLYCOL 3350 17 G PO PACK: 17.0000 g | PACK | Freq: Every day | ORAL | Status: DC | PRN

## 2017-03-13 MED ORDER — WARFARIN SODIUM 3 MG PO TABS
3.0000 mg | ORAL_TABLET | Freq: Once | ORAL | Status: AC
  Administered 2017-03-13: 3 mg via ORAL
  Filled 2017-03-13: qty 1

## 2017-03-13 MED ORDER — SODIUM CHLORIDE 0.9 % IV BOLUS (SEPSIS)
250.0000 mL | Freq: Once | INTRAVENOUS | Status: AC
  Administered 2017-03-13: 250 mL via INTRAVENOUS

## 2017-03-13 NOTE — Progress Notes (Signed)
  Speech Language Pathology Treatment: Dysphagia  Patient Details Name: Glenda Dickson MRN: [id number] DOB: [birth year] Today's Date: 03/13/2017 Time: [id number] SLP Time Calculation (min) (ACUTE ONLY): 200 min  Assessment / Plan / Recommendation Clinical Impression  Pt seen with daughter at bedside. Provided teaching for use of thickener, reiterated importance of basic aspiration precautions, upright position for bed. Provided demonstration of tactile cueing given pts poor awareness initially of PO trials. Daughter demonstrated understanding. Pt continues to be able to swallow and mastication with occasional throat clear, but intake is limited; encouraged offering fluids and snacks often. Will follow for tolerance and upgrade depending on progress.   HPI HPI: Pt is an 83 year old female arriving after a fall in ALF. Founds to have cerebellar infarct, Closed comminuted intra-articular fracture of distal femur, left, initial encounter, Acute renal failure. Non operative courase decided for femur fx. Daughter reports a history of coughing with liquids, recent pneumonia though CHF involved.       SLP Plan  Continue with current plan of care       Recommendations  Diet recommendations: Dysphagia 3 (mechanical soft);Nectar-thick liquid Liquids provided via: Cup;Straw Medication Administration: Whole meds with puree Supervision: Full supervision/cueing for compensatory strategies Compensations: Slow rate;Small sips/bites Postural Changes and/or Swallow Maneuvers: Seated upright 90 degrees                Oral Care Recommendations: Oral care BID Follow up Recommendations: Skilled Nursing facility SLP Visit Diagnosis: Dysphagia, oropharyngeal phase (R13.12) Plan: Continue with current plan of care       GO               [facility] LLCBonnie Airlie Blumenberg, MA CCC-SLP [phone]  Claudine MoutonDeBlois, Weston Kallman Caroline 03/13/2017, 2:02 PM

## 2017-03-13 NOTE — Progress Notes (Signed)
ANTICOAGULATION CONSULT NOTE - Follow Up Consult  Pharmacy Consult for enoxaparin and warfarin Indication: atrial fibrillation  Allergies  Allergen Reactions  . Benzalkonium Chloride-Alcohol Other (See Comments)    Patient/family is not familiar with this allergy  . Codeine Itching and Nausea Only    Patient Measurements: Height: 5\' 5"  (165.1 cm) Weight: 209 lb 10.5 oz (95.1 kg) IBW/kg (Calculated) : 57  Vital Signs: Temp: 97.6 F (36.4 C) (06/21 0728) Temp Source: Axillary (06/21 0728) BP: 111/62 (06/21 0728) Pulse Rate: 109 (06/21 0728)  Labs:  Recent Labs  03/11/17 1028  03/11/17 1633 03/11/17 1859 03/11/17 1900 03/12/17 0219 03/13/17 0204  HGB 8.9*  --  8.8*  --   --  8.7* 8.2*  HCT 27.7*  --  26.8*  --   --  27.0* 25.5*  PLT 121*  --  142*  --   --  121* 131*  LABPROT  --   < >  --   --  18.9* 18.8* 20.7*  INR  --   < >  --   --  1.57 1.56 1.76  CREATININE 2.78*  --   --  2.89*  --   --  3.45*  TROPONINI  --   --   --   --   --  0.06*  --   < > = values in this interval not displayed.  Estimated Creatinine Clearance: 13.3 mL/min (A) (by C-G formula based on SCr of 3.45 mg/dL (H)).   Medications:  Scheduled:  . atorvastatin  40 mg Oral QPM  . chlorhexidine  15 mL Mouth Rinse BID  . enoxaparin (LOVENOX) injection  90 mg Subcutaneous Q24H  . insulin aspart  0-9 Units Subcutaneous TID WC  . levothyroxine  50 mcg Intravenous Daily  . liothyronine  5 mcg Oral Q8H  . mouth rinse  15 mL Mouth Rinse BID  . Warfarin - Pharmacist Dosing Inpatient   Does not apply q1800    Assessment: 83 yo F admitted 03/10/2017 with distal femur fracture on warfarin PTA. On admission patient's INR was reveresed with Kcentra and 10 mg IV vitamin K. [facility] and has elected for nonsurgical management of fracture. Pharmacy consulted to dose warfarin and bridge with enoxaparin.  *Home dose 3.75mg  daily except 2.5mg  TTSun  INR 1.76 (subtherapeutic), Hgb 8.2, plts  131. No signs/symptoms of bleeding noted.  Goal of Therapy:  INR 2-3 Anti-Xa level 0.6-1 units/ml 4hrs after LMWH dose given Monitor platelets by anticoagulation protocol: Yes   Plan:  - Continue Lovenox 90 mg SQ q24h - Warfarin 3 mg PO x1 tonight - Monitor daily INR, CBC and signs/symptoms of bleeding  Casilda Carlsaylor Kailan Carmen, PharmD, BCPS PGY-2 Infectious Diseases Pharmacy Resident Pager: [phone] 03/13/2017,10:40 AM

## 2017-03-13 NOTE — Progress Notes (Addendum)
Patient ID: [name], female   DOB: 02/25/1928, 83 y.o.   MRN: [id number]                                                                PROGRESS NOTE                                                                                                                                                                                                             Patient Demographics:    Glenda Dickson, is a 83 y.o. female, DOB - [birth year], WUJ:[id number]  Admit date - 03/10/2017   Admitting Physician Jonah Blue, MD  Outpatient Primary MD for the patient is Benita Stabile, MD  LOS - 3  Outpatient Specialists:     Chief Complaint  Patient presents with  . Fall  . Altered Mental Status       Brief Narrative  83 y.o.femaleALF resident with history of HTN, HLD, remote left hip fracture, DM, Dementia, Depression, Systolic CHF (EF 95-62% 5/18), Chronic anemia (baseline appears to be 11-12), and Afib on Coumadin who presented to [facility] with leg pain after several falls, as well as 24hrs of confusion and lethargy.   [facility] ED eval noted a new L cerebellar CVA, and xrays noted a comminuted fx of the distal femor metaphysis on the L.  The pt was transferred to [facility] for a stroke evaluation.     Subjective:    Glenda Dickson today seems slightly less alert this am.  No headache, No chest pain, No abdominal pain - No Nausea, No new weakness tingling or numbness, No Cough - SOB.    Assessment  & Plan :    Principal Problem:   Cerebellar infarct (HCC) Active Problems:   Hypertension   Diabetes mellitus type II, non insulin dependent (HCC)   Hyperlipidemia   Atrial fibrillation (HCC)   Anemia, normocytic normochromic   Congestive heart failure, unspecified   Other closed fracture of lower end of femur   Hyperkalemia   Acute renal failure superimposed on stage 4 chronic kidney disease (HCC)   Hyperbilirubinemia   Dementia   Pain in joint, shoulder region   Hypothyroidism   Acute respiratory  failure with hypoxia (HCC)   Jaundice   Elevated lactic acid level   Hypotension, unspecified  Metabolic encephalopathy   Unspecified cerebral artery occlusion with cerebral infarction   Acute kidney failure, unspecified (HCC)   Chronic combined systolic and diastolic heart failure (HCC)   Acidosis   Anticoagulant long-term use   Closed fracture of left femur (HCC)  AMS due to ? Hypothyroidism vs worsening renal function Will monitor today, if not improving may consider repeat scan Check ammonia level  Hyperkalemia mild Repeat bmp this afternoon  Severe Hypothyroidism / ?Myxedema coma  TSH severely elevated at 171 - ?lab error - Free T4 is low at 0.35 - pt is relatively hypothermic and hypotensive - will tx for myxedema and recheck TSH in AM for confirmation - utilize T4 and T3 until sx greatly improved then transition to T4 only - cortisol is normal therefore hydrocortisone not indicated   ?Cerebellar CVA - ruled out by MRI Stroke Team is directing care - it is possible her wavering mental status may be due to myxedema - follow w/ tx  Per Stroke team yesterday, "no acute stroke"  ok to resume coumadin, coumadin pharmacy to dose  Chronic systolic CHF - EF 25-30% via TTE May 2018 No evidence of signif volume overload at present - follow Is/Os and daily weights  AKI on CKD stage 4 Baseline crt 1.68 - likely pre-renal azotemia due to intermittent hypotension - follow w/ tx of myxedema and volume expansion - avoid ACEi/ARB, avoid nsaids Creatinine worse today, will increase iv hydration Check urine sodium, urine creatinine, urine eosinophils, renal ultrasound Check cmp in am, if not improving will need nephrology  Hypotension Possibly due to myxedema - follow w/ tx for same - keep hydrated   Hyperbilirubinemia - ?Cirrhosis  Follow w/ hydration   DM2 CBG currently well-controlled  Chronic Atrial Fib CHA2DS2 - VASc is 8 - resume anticoagulation (discussed with family) -  follow rate on telemetry with relatively high dose thyroid replacement therapy being initiated  Left distal femur fracture Care as per Orthopedics - no plans for the OR at present  Dementia  Family clarifies the patient had no cognitive deficits until early this year when they developed suddenly - follow clinically with treatment of myxedema  Chronic anemia  Most likely related to chronic kidney disease   DVT prophylaxis: lovenox , warfarin  Code Status: FULL CODE Family Communication:  No family at bedside Disposition Plan: SDU  Consultants:  Orthopedics Neurology  Procedures: None  Antimicrobials:  None   Lab Results  Component Value Date   PLT 131 (L) 03/13/2017      Anti-infectives    Start     Dose/Rate Route Frequency Ordered Stop   03/12/17 0045  cefTRIAXone (ROCEPHIN) 1 g in dextrose 5 % 50 mL IVPB  Status:  Discontinued     1 g 100 mL/hr over 30 Minutes Intravenous Every 24 hours 03/12/17 0031 03/12/17 1620        Objective:   Vitals:   03/12/17 1913 03/12/17 2249 03/13/17 0027 03/13/17 0355  BP: 101/90  (!) 113/99 (!) 134/96  Pulse: (!) 57 (!) 102 (!) 106 97  Resp: (!) 25 17 15 18   Temp: 97.6 F (36.4 C) 97.7 F (36.5 C)  97.6 F (36.4 C)  TempSrc: Axillary Axillary  Axillary  SpO2: 91%   95%  Weight:    95.1 kg (209 lb 10.5 oz)  Height:        Wt Readings from Last 3 Encounters:  03/13/17 95.1 kg (209 lb 10.5 oz)  01/16/17 88 kg (194 lb)  10/23/16 81.6 kg (180 lb)     Intake/Output Summary (Last 24 hours) at 03/13/17 0723 Last data filed at 03/13/17 0600  Gross per 24 hour  Intake             1920 ml  Output              270 ml  Net             1650 ml     Physical Exam  Awake Alert, Oriented X 1, No new F.N deficits, Normal affect [place].AT,PERRAL Supple Neck,No JVD, No cervical lymphadenopathy appriciated.  Symmetrical Chest wall movement, Good air movement bilaterally, CTAB Tachy s1, s2, ,No Gallops,Rubs or new  Murmurs, No Parasternal Heave +ve B.Sounds, Abd Soft, No tenderness, No organomegaly appriciated, No rebound - guarding or rigidity. No Cyanosis, Clubbing or edema, No new Rash or bruise   Moving left arm,  Slight movement of right arm.  Responds to voice.     Data Review:    CBC  Recent Labs Lab 03/10/17 1509  03/11/17 0543 03/11/17 1028 03/11/17 1633 03/12/17 0219 03/13/17 0204  WBC 6.5  < > 8.1 7.4 9.8 7.9 8.3  HGB 8.3*  < > 8.2* 8.9* 8.8* 8.7* 8.2*  HCT 25.0*  < > 26.2* 27.7* 26.8* 27.0* 25.5*  PLT 105*  < > 117* 121* 142* 121* 131*  MCV 105.9*  < > 106.1* 106.9* 107.2* 108.0* 107.6*  MCH 35.2*  < > 33.2 34.4* 35.2* 34.8* 34.6*  MCHC 33.2  < > 31.3 32.1 32.8 32.2 32.2  RDW 23.0*  < > 23.0* 23.2* 23.3* 23.5* 23.7*  LYMPHSABS 1.2  --   --   --   --   --   --   MONOABS 0.6  --   --   --   --   --   --   EOSABS 0.0  --   --   --   --   --   --   BASOSABS 0.0  --   --   --   --   --   --   < > = values in this interval not displayed.  Chemistries   Recent Labs Lab 03/10/17 1509 03/11/17 1028 03/11/17 1859 03/13/17 0204  NA 138 139 140 141  K 5.3* 5.6* 5.5* 5.3*  CL 99* 103 103 105  CO2 26 23 21* 25  GLUCOSE 160* 110* 83 153*  BUN 55* 58* 58* 64*  CREATININE 2.41* 2.78* 2.89* 3.45*  CALCIUM 9.1 8.8* 8.7* 8.0*  AST 42*  --  56* 114*  ALT 29  --  35 71*  ALKPHOS 124  --  120 104  BILITOT 4.4*  --  5.4* 6.8*   ------------------------------------------------------------------------------------------------------------------  Recent Labs  03/11/17 0543  CHOL 115  HDL 29*  LDLCALC 66  TRIG 98  CHOLHDL 4.0    Lab Results  Component Value Date   HGBA1C 6.0 (H) 03/11/2017   ------------------------------------------------------------------------------------------------------------------  Recent Labs  03/13/17 0204  TSH 169.681*    ------------------------------------------------------------------------------------------------------------------  Recent Labs  03/13/17 0204  VITAMINB12 1,845*  FOLATE 17.1  FERRITIN 305  TIBC 281  IRON 50  RETICCTPCT 6.1*    Coagulation profile  Recent Labs Lab 03/11/17 0543 03/11/17 1147 03/11/17 1900 03/12/17 0219 03/13/17 0204  INR 1.51 1.55 1.57 1.56 1.76    No results for input(s): DDIMER in the last 72 hours.  Cardiac Enzymes  Recent Labs Lab 03/12/17 0219  TROPONINI 0.06*   ------------------------------------------------------------------------------------------------------------------  Component Value Date/Time   BNP 832.6 (H) 03/11/2017 1900    Inpatient Medications  Scheduled Meds: . atorvastatin  40 mg Oral QPM  . chlorhexidine  15 mL Mouth Rinse BID  . enoxaparin (LOVENOX) injection  90 mg Subcutaneous Q24H  . insulin aspart  0-9 Units Subcutaneous TID WC  . levothyroxine  50 mcg Intravenous Daily  . liothyronine  5 mcg Oral Q8H  . mouth rinse  15 mL Mouth Rinse BID  . Warfarin - Pharmacist Dosing Inpatient   Does not apply q1800   Continuous Infusions: . sodium chloride 75 mL/hr at 03/12/17 2049  . methocarbamol (ROBAXIN)  IV 500 mg (03/12/17 1856)   PRN Meds:.[DISCONTINUED] methocarbamol **OR** methocarbamol (ROBAXIN)  IV, morphine injection, ondansetron (ZOFRAN) IV, RESOURCE THICKENUP CLEAR  Micro Results Recent Results (from the past 240 hour(s))  Surgical pcr screen     Status: None   Collection Time: 03/10/17 11:18 PM  Result Value Ref Range Status   MRSA, PCR NEGATIVE NEGATIVE Final   Staphylococcus aureus NEGATIVE NEGATIVE Final    Comment:        The Xpert SA Assay (FDA approved for NASAL specimens in patients over 55 years of age), is one component of a comprehensive surveillance program.  Test performance has been validated by [facility] for patients greater than or equal to 86 year old. It is not intended to  diagnose infection nor to guide or monitor treatment.   Culture, blood (Routine X 2) w Reflex to ID Panel     Status: None (Preliminary result)   Collection Time: 03/11/17  6:38 PM  Result Value Ref Range Status   Specimen Description BLOOD LEFT HAND  Final   Special Requests IN PEDIATRIC BOTTLE Blood Culture adequate volume  Final   Culture NO GROWTH < 24 HOURS  Final   Report Status PENDING  Incomplete  Culture, blood (Routine X 2) w Reflex to ID Panel     Status: None (Preliminary result)   Collection Time: 03/11/17  6:59 PM  Result Value Ref Range Status   Specimen Description BLOOD RIGHT HAND  Final   Special Requests IN PEDIATRIC BOTTLE Blood Culture adequate volume  Final   Culture NO GROWTH < 24 HOURS  Final   Report Status PENDING  Incomplete    Radiology Reports Dg Chest 1 View  Result Date: 03/10/2017 CLINICAL DATA:  83 year old with multiple recent falls, presenting with jaundice, slurred speech and shortness of breath. Current history of hypertension, diabetes, chronic diastolic CHF. EXAM: CHEST 1 VIEW COMPARISON:  01/14/2017, 10/23/2016 and earlier. FINDINGS: Cardiac silhouette moderately to markedly enlarged, unchanged. Pulmonary vascularity normal without evidence of pulmonary edema. Chronic dense consolidation in the left lower lobe and associated left pleural effusion, unchanged dating back to January, 2018. Lungs otherwise clear. No visible right pleural effusion. Degenerative changes again noted involving the right shoulder. IMPRESSION: Chronic left lower lobe atelectasis and/or pneumonia and left pleural effusion. Stable cardiomegaly. No new abnormalities since 04/24 1,018. Electronically Signed   By: Hulan Saas M.D.   On: 03/10/2017 15:08   Ct Head Wo Contrast  Result Date: 03/10/2017 CLINICAL DATA:  Several recent falls.  Slurred speech. EXAM: CT HEAD WITHOUT CONTRAST TECHNIQUE: Contiguous axial images were obtained from the base of the skull through the vertex  without intravenous contrast. COMPARISON:  Head CT October 23, 2016 and brain MRI October 23, 2016 FINDINGS: Brain: There is moderate diffuse atrophy. There is a focal infarct in the inferior right cerebellar hemisphere  with cytotoxic edema in the inferior to mid right cerebellar hemisphere, not present on prior studies. There is no intracranial mass, hemorrhage, extra-axial fluid collection, or midline shift. There is mild small vessel disease in the centra semiovale bilaterally. No other recent/acute appearing infarct is appreciable on this study. Vascular: There is no appreciable hyperdense vessel. There is calcification in each carotid siphon as well as in each distal vertebral artery. Skull: The bony calvarium appears intact. Sinuses/Orbits: There is mucosal thickening in several ethmoid air cells bilaterally. There is diffuse opacification in the right sphenoid sinus. Other paranasal sinuses are clear. Orbits appear symmetric bilaterally. Other: Mastoid air cells are clear. IMPRESSION: Recent appearing infarct in the inferior right cerebellar hemisphere. Suspect infarct involving a portion of the right posterior inferior cerebral artery distribution. No other acute appearing infarct evident. There is underlying atrophy with mild periventricular small vessel disease. There is no mass, hemorrhage, or extra-axial fluid collection. There are foci of arterial calcification in the anterior and posterior circulations. There is paranasal sinus disease, most marked in the right sphenoid sinus region. Electronically Signed   By: Bretta Bang III M.D.   On: 03/10/2017 14:08   Mr Maxine Glenn Head Wo Contrast  Result Date: 03/12/2017 CLINICAL DATA:  Initial evaluation for acute stroke.  Headache. EXAM: MRI HEAD WITHOUT CONTRAST MRA HEAD WITHOUT CONTRAST TECHNIQUE: Multiplanar, multiecho pulse sequences of the brain and surrounding structures were obtained without intravenous contrast. Angiographic images of the head were  obtained using MRA technique without contrast. COMPARISON:  Prior CT from 03/10/2017. FINDINGS: MRI HEAD FINDINGS Brain: Study severely degraded by motion artifact. Diffuse prominence of the CSF containing spaces compatible generalized cerebral atrophy. No significant cerebral white matter disease for age. No abnormal foci of restricted diffusion to suggest acute or subacute ischemia. Possible small remote lacunar infarct noted within the right thalamus. No other evidence for chronic infarction. No evidence for acute or chronic intracranial hemorrhage. No mass lesion, midline shift or mass effect. Mild ventricular prominence related global parenchymal volume loss of hydrocephalus. No extra-axial fluid collection. Pituitary gland and suprasellar region grossly within normal limits. Midline structures intact and normal. Vascular: Major intracranial vascular flow voids are maintained. Skull and upper cervical spine: Craniocervical junction within normal limits. Visualized upper cervical spine grossly unremarkable. Bone marrow signal intensity within normal limits. No scalp soft tissue abnormality. Sinuses/Orbits: Globes and orbital soft tissues grossly unremarkable. Chronic right sphenoid sinus disease. Paranasal sinuses otherwise clear. Trace bilateral mastoid effusions. Inner ear structures grossly normal. MRA HEAD FINDINGS ANTERIOR CIRCULATION: Study severely degraded by motion artifact. Distal cervical segments of the internal carotid artery is grossly patent with antegrade flow. Normal flow seen within the petrous, cavernous, and supraclinoid segments without obvious stenosis. ICA termini patent bilaterally. A1 segments patent. Anterior communicating artery not well evaluated. Anterior cerebral arteries grossly patent to their distal aspects. M1 segments patent without obvious stenosis, poorly evaluated on this motion degraded exam. No obvious proximal M2 occlusion. Distal MCA branches grossly patent, poorly  evaluated on this motion degraded study. POSTERIOR CIRCULATION: Vertebral arteries patent to the vertebrobasilar junction without obvious stenosis. Posterior inferior cerebral artery is not seen on this motion degraded exam. Basilar artery patent to its distal aspect without obvious stenosis. Superior cerebral arteries not well seen. Both of the posterior cerebral arteries primarily supplied via the basilar. PCAs are patent proximally, not well evaluated distally, particularly on the left. No obvious aneurysm or vascular malformation. IMPRESSION: MRI HEAD IMPRESSION: 1. Moderately motion degraded study. 2. No  acute intracranial infarct or other process identified. 3. Generalized age-related cerebral atrophy. MRA HEAD IMPRESSION: 1. Severely limited study due to motion. 2. No large or proximal arterial branch occlusion within the intracranial circulation. No obvious high-grade or correctable stenosis. Further delineation of the intracranial vasculature fairly limited on this motion degraded exam. Electronically Signed   By: Rise Mu M.D.   On: 03/12/2017 00:15   Mr Brain Wo Contrast  Result Date: 03/12/2017 CLINICAL DATA:  Initial evaluation for acute stroke.  Headache. EXAM: MRI HEAD WITHOUT CONTRAST MRA HEAD WITHOUT CONTRAST TECHNIQUE: Multiplanar, multiecho pulse sequences of the brain and surrounding structures were obtained without intravenous contrast. Angiographic images of the head were obtained using MRA technique without contrast. COMPARISON:  Prior CT from 03/10/2017. FINDINGS: MRI HEAD FINDINGS Brain: Study severely degraded by motion artifact. Diffuse prominence of the CSF containing spaces compatible generalized cerebral atrophy. No significant cerebral white matter disease for age. No abnormal foci of restricted diffusion to suggest acute or subacute ischemia. Possible small remote lacunar infarct noted within the right thalamus. No other evidence for chronic infarction. No evidence for  acute or chronic intracranial hemorrhage. No mass lesion, midline shift or mass effect. Mild ventricular prominence related global parenchymal volume loss of hydrocephalus. No extra-axial fluid collection. Pituitary gland and suprasellar region grossly within normal limits. Midline structures intact and normal. Vascular: Major intracranial vascular flow voids are maintained. Skull and upper cervical spine: Craniocervical junction within normal limits. Visualized upper cervical spine grossly unremarkable. Bone marrow signal intensity within normal limits. No scalp soft tissue abnormality. Sinuses/Orbits: Globes and orbital soft tissues grossly unremarkable. Chronic right sphenoid sinus disease. Paranasal sinuses otherwise clear. Trace bilateral mastoid effusions. Inner ear structures grossly normal. MRA HEAD FINDINGS ANTERIOR CIRCULATION: Study severely degraded by motion artifact. Distal cervical segments of the internal carotid artery is grossly patent with antegrade flow. Normal flow seen within the petrous, cavernous, and supraclinoid segments without obvious stenosis. ICA termini patent bilaterally. A1 segments patent. Anterior communicating artery not well evaluated. Anterior cerebral arteries grossly patent to their distal aspects. M1 segments patent without obvious stenosis, poorly evaluated on this motion degraded exam. No obvious proximal M2 occlusion. Distal MCA branches grossly patent, poorly evaluated on this motion degraded study. POSTERIOR CIRCULATION: Vertebral arteries patent to the vertebrobasilar junction without obvious stenosis. Posterior inferior cerebral artery is not seen on this motion degraded exam. Basilar artery patent to its distal aspect without obvious stenosis. Superior cerebral arteries not well seen. Both of the posterior cerebral arteries primarily supplied via the basilar. PCAs are patent proximally, not well evaluated distally, particularly on the left. No obvious aneurysm or  vascular malformation. IMPRESSION: MRI HEAD IMPRESSION: 1. Moderately motion degraded study. 2. No acute intracranial infarct or other process identified. 3. Generalized age-related cerebral atrophy. MRA HEAD IMPRESSION: 1. Severely limited study due to motion. 2. No large or proximal arterial branch occlusion within the intracranial circulation. No obvious high-grade or correctable stenosis. Further delineation of the intracranial vasculature fairly limited on this motion degraded exam. Electronically Signed   By: Rise Mu M.D.   On: 03/12/2017 00:15   Dg Chest Port 1 View  Result Date: 03/11/2017 CLINICAL DATA:  The acute respiratory failure with hypoxia, type II diabetes mellitus, hypertension, atrial fibrillation, CHF EXAM: PORTABLE CHEST 1 VIEW COMPARISON:  Portable exam 1809 hours compared 03/10/2017 FINDINGS: Enlargement of cardiac silhouette. Atherosclerotic calcification aorta. Mediastinal contours and pulmonary vascularity otherwise normal for technique. Persistent LEFT pleural effusion with atelectasis versus consolidation in  LEFT lower lobe, little changed. Minimal RIGHT basilar atelectasis. Upper lungs clear. No pneumothorax. Bones demineralized with periarticular calcifications at the RIGHT glenohumeral joint region. IMPRESSION: Enlargement of cardiac silhouette. Persistent LEFT lower lobe atelectasis versus consolidation with associated LEFT pleural effusion. Aortic Atherosclerosis (ICD10-I70.0). Electronically Signed   By: Ulyses Southward M.D.   On: 03/11/2017 18:33   Dg Hip Unilat W Or Wo Pelvis 2-3 Views Left  Result Date: 03/10/2017 CLINICAL DATA:  Pain.  Multiple recent falls EXAM: DG HIP (WITH OR WITHOUT PELVIS) 2-3V LEFT COMPARISON:  None. FINDINGS: Frontal pelvis as well as frontal and lateral left hip images or obtained. There is postoperative change in the proximal left femur. Tip of screw is in the proximal femoral head. There is myositis ossificans dorsal to the greater  tuberosity. There is mild symmetric narrowing of both hip joints. There is no acute fracture or dislocation. There is extensive aortoiliac and bilateral common and superficial femoral artery atherosclerosis. IMPRESSION: No acute fracture or dislocation. Mild narrowing of both hip joints. Myositis ossifications superior to the left greater trochanter. Postoperative change on the left. Extensive aortoiliac and femoral artery atherosclerosis. Electronically Signed   By: Bretta Bang III M.D.   On: 03/10/2017 14:43   Dg Femur Min 2 Views Left  Result Date: 03/10/2017 CLINICAL DATA:  Pain following recent fall EXAM: LEFT FEMUR 2 VIEWS COMPARISON:  None. FINDINGS: Frontal and lateral views were obtained. There is a comminuted fracture of the distal femoral metaphysis with posterior displacement of distal major fracture fragment with respect to proximal major fracture fragment. More proximally, there is no acute fracture or dislocation. There is evidence of old trauma with postoperative fixation. There is myositis ossificans superior to the greater trochanter. No dislocation. There is a joint effusion. There is extensive vascular atherosclerosis. IMPRESSION: Comminuted fracture distal femoral metaphysis with posterior displacement of distal major fracture fragment with respect to proximal major fragment. Advanced arthropathy in the knee joint without frank dislocation. There is a moderate joint effusion. Postoperative change proximally without acute fracture or dislocation proximally. There is extensive femoral artery atherosclerosis. Electronically Signed   By: Bretta Bang III M.D.   On: 03/10/2017 14:45   US Abdomen Limited Ruq  Result Date: 03/12/2017 CLINICAL DATA:  83 year old female with jaundice. EXAM: ULTRASOUND ABDOMEN LIMITED RIGHT UPPER QUADRANT COMPARISON:  Abdominal CT dated 05/06/2006 FINDINGS: Gallbladder: Tiny gallbladder stones noted. The gallbladder wall is mildly thickened measuring 5  mm, possibly related to ascites. Negative sonographic Murphy's sign. Common bile duct: Diameter: 5 mm Liver: There is heterogeneous and coarsened liver echotexture likely related to underlying fatty infiltration or cirrhosis. There is a small subhepatic ascites. IMPRESSION: 1. Cholelithiasis without definite sonographic evidence of acute cholecystitis. A hepatobiliary scintigraphy may provide better evaluation of the gallbladder if there is a high clinical concern for acute cholecystitis . 2. Fatty liver with findings suggestive of cirrhosis. Correlation with clinical exam and liver function test recommended. MRI may provide better evaluation of the liver if clinically indicated. 3. Small ascites. Electronically Signed   By: Elgie Collard M.D.   On: 03/12/2017 00:38    Time Spent in minutes  30   Pearson Grippe M.D on 03/13/2017 at 7:23 AM  Between 7am to 7pm - Pager - [phone]  After 7pm go to [email or web address] - password [facility]  [facility] -  Office  [phone]

## 2017-03-13 NOTE — Consult Note (Signed)
   [facility]   03/13/2017  Jeani SowSarah T Arment Jan 29, 1928 [id number]   [facility] with social worker. [facility] and chart review found that patient fall and to have cerebellar infarct, Closed comminuted intra-articular fracture of distal femur, left, initial encounter, Acute renal failure.  Spoke with inpatient RNCM to alert her of [facility] following.  Will check for disposition and needs as appropriate.  For questions, please contact:  Charlesetta ShanksVictoria Nivea Wojdyla, RN BSN CCM [facility] Lia[facility]  [phone] business mobile phone Toll free office [phone]

## 2017-03-14 ENCOUNTER — Inpatient Hospital Stay (HOSPITAL_COMMUNITY): Payer: Medicare Other | Admitting: Certified Registered"

## 2017-03-14 ENCOUNTER — Inpatient Hospital Stay (HOSPITAL_COMMUNITY): Payer: Medicare Other

## 2017-03-14 DIAGNOSIS — N170 Acute kidney failure with tubular necrosis: Secondary | ICD-10-CM

## 2017-03-14 DIAGNOSIS — D631 Anemia in chronic kidney disease: Secondary | ICD-10-CM

## 2017-03-14 DIAGNOSIS — I9589 Other hypotension: Secondary | ICD-10-CM

## 2017-03-14 DIAGNOSIS — K7201 Acute and subacute hepatic failure with coma: Secondary | ICD-10-CM

## 2017-03-14 DIAGNOSIS — F0150 Vascular dementia without behavioral disturbance: Secondary | ICD-10-CM

## 2017-03-14 DIAGNOSIS — I5022 Chronic systolic (congestive) heart failure: Secondary | ICD-10-CM

## 2017-03-14 DIAGNOSIS — S72402A Unspecified fracture of lower end of left femur, initial encounter for closed fracture: Secondary | ICD-10-CM

## 2017-03-14 DIAGNOSIS — K7031 Alcoholic cirrhosis of liver with ascites: Secondary | ICD-10-CM

## 2017-03-14 DIAGNOSIS — E1121 Type 2 diabetes mellitus with diabetic nephropathy: Secondary | ICD-10-CM

## 2017-03-14 DIAGNOSIS — E035 Myxedema coma: Principal | ICD-10-CM

## 2017-03-14 DIAGNOSIS — I4891 Unspecified atrial fibrillation: Secondary | ICD-10-CM

## 2017-03-14 LAB — COMPREHENSIVE METABOLIC PANEL
ALT: 120 U/L — ABNORMAL HIGH (ref 14–54)
AST: 222 U/L — AB (ref 15–41)
Albumin: 2.8 g/dL — ABNORMAL LOW (ref 3.5–5.0)
Alkaline Phosphatase: 104 U/L (ref 38–126)
Anion gap: 12 (ref 5–15)
BUN: 67 mg/dL — AB (ref 6–20)
CHLORIDE: 108 mmol/L (ref 101–111)
CO2: 21 mmol/L — ABNORMAL LOW (ref 22–32)
Calcium: 7.9 mg/dL — ABNORMAL LOW (ref 8.9–10.3)
Creatinine, Ser: 3.85 mg/dL — ABNORMAL HIGH (ref 0.44–1.00)
GFR, EST AFRICAN AMERICAN: 11 mL/min — AB (ref >60)
GFR, EST NON AFRICAN AMERICAN: 10 mL/min — AB (ref >60)
Glucose, Bld: 118 mg/dL — ABNORMAL HIGH (ref 65–99)
POTASSIUM: 5.2 mmol/L — AB (ref 3.5–5.1)
Sodium: 141 mmol/L (ref 135–145)
Total Bilirubin: 10.6 mg/dL — ABNORMAL HIGH (ref 0.3–1.2)
Total Protein: 4.9 g/dL — ABNORMAL LOW (ref 6.5–8.1)

## 2017-03-14 LAB — GLUCOSE, CAPILLARY
GLUCOSE-CAPILLARY: 110 mg/dL — AB (ref 65–99)
GLUCOSE-CAPILLARY: 118 mg/dL — AB (ref 65–99)
Glucose-Capillary: 143 mg/dL — ABNORMAL HIGH (ref 65–99)
Glucose-Capillary: 114 mg/dL — ABNORMAL HIGH (ref 65–99)

## 2017-03-14 LAB — CALCIUM / CREATININE RATIO, URINE
CALCIUM UR: 7.2 mg/dL
CALCIUM/CREAT. RATIO: 113 mg/g{creat} (ref 0–260)
CREATININE, UR: 63.7 mg/dL

## 2017-03-14 LAB — PROTIME-INR
INR: 1.76
Prothrombin Time: 20.7 seconds — ABNORMAL HIGH (ref 11.4–15.2)

## 2017-03-14 LAB — CBC
HCT: 25.4 % — ABNORMAL LOW (ref 36.0–46.0)
Hemoglobin: 8.1 g/dL — ABNORMAL LOW (ref 12.0–15.0)
MCH: 34.9 pg — ABNORMAL HIGH (ref 26.0–34.0)
MCHC: 31.9 g/dL (ref 30.0–36.0)
MCV: 109.5 fL — AB (ref 78.0–100.0)
PLATELETS: 118 10*3/uL — AB (ref 150–400)
RBC: 2.32 MIL/uL — ABNORMAL LOW (ref 3.87–5.11)
RDW: 24.4 % — ABNORMAL HIGH (ref 11.5–15.5)
WBC: 8.9 10*3/uL (ref 4.0–10.5)

## 2017-03-14 LAB — PHOSPHORUS: Phosphorus: 5.1 mg/dL — ABNORMAL HIGH (ref 2.5–4.6)

## 2017-03-14 LAB — GAMMA GT: GGT: 67 U/L — ABNORMAL HIGH (ref 7–50)

## 2017-03-14 LAB — MAGNESIUM: Magnesium: 2 mg/dL (ref 1.7–2.4)

## 2017-03-14 MED ORDER — SODIUM CHLORIDE 0.9% FLUSH
10.0000 mL | INTRAVENOUS | Status: DC | PRN
  Administered 2017-03-18: 30 mL
  Filled 2017-03-14 (×2): qty 40

## 2017-03-14 MED ORDER — SODIUM CHLORIDE 0.9 % IV SOLN: INTRAVENOUS | Status: DC | PRN

## 2017-03-14 MED ORDER — AMIODARONE HCL IN DEXTROSE 360-4.14 MG/200ML-% IV SOLN
30.0000 mg/h | INTRAVENOUS | Status: DC
  Administered 2017-03-14 – 2017-03-16 (×3): 30 mg/h via INTRAVENOUS
  Filled 2017-03-14 (×3): qty 200

## 2017-03-14 MED ORDER — AMIODARONE HCL IN DEXTROSE 360-4.14 MG/200ML-% IV SOLN
60.0000 mg/h | INTRAVENOUS | Status: AC
  Administered 2017-03-14: 60 mg/h via INTRAVENOUS
  Filled 2017-03-14 (×2): qty 200

## 2017-03-14 MED ORDER — DOPAMINE-DEXTROSE 3.2-5 MG/ML-% IV SOLN
0.0000 ug/kg/min | INTRAVENOUS | Status: DC
  Administered 2017-03-14: 8 ug/kg/min via INTRAVENOUS
  Administered 2017-03-14 – 2017-03-15 (×2): 5 ug/kg/min via INTRAVENOUS
  Administered 2017-03-15: 7 ug/kg/min via INTRAVENOUS
  Administered 2017-03-16: 10 ug/kg/min via INTRAVENOUS
  Filled 2017-03-14 (×4): qty 250

## 2017-03-14 MED ORDER — SODIUM CHLORIDE 0.9% FLUSH
10.0000 mL | Freq: Two times a day (BID) | INTRAVENOUS | Status: DC
  Administered 2017-03-14 – 2017-03-21 (×11): 10 mL

## 2017-03-14 MED ORDER — WARFARIN SODIUM 3 MG PO TABS
3.0000 mg | ORAL_TABLET | Freq: Once | ORAL | Status: AC
  Administered 2017-03-14: 3 mg via ORAL
  Filled 2017-03-14: qty 1

## 2017-03-14 NOTE — Procedures (Signed)
Attempted to place Aline per MD, unsuccessful X2, RN made aware and is notifying MD.

## 2017-03-14 NOTE — Anesthesia Procedure Notes (Signed)
Arterial Line Insertion Start/End6/22/2018 10:15 AM, 03/14/2017 10:30 AM Performed by: Lucinda DellECARLO, Latori Beggs M, CRNA  Patient location: Nursing unit. Preanesthetic checklist: patient identified, IV checked, risks and benefits discussed, monitors and equipment checked and timeout performed Lidocaine 1% used for infiltration Left, radial was placed Catheter size: 20 G Hand hygiene performed  and maximum sterile barriers used  Allen's test indicative of satisfactory collateral circulation Attempts: 1 Procedure performed without using ultrasound guided technique. Following insertion, dressing applied and Biopatch. Post procedure assessment: decreased circulation  Patient tolerated the procedure well with no immediate complications.

## 2017-03-14 NOTE — Care Management Important Message (Signed)
Important Message  Patient Details  Name: Glenda SowSarah T Colligan MRN: [id number] Date of Birth: 15-Nov-1927   Medicare Important Message Given:  Yes    Erilyn Pearman Stefan ChurchBratton 03/14/2017, 1:11 PM

## 2017-03-14 NOTE — Progress Notes (Signed)
PROGRESS NOTE    Glenda Dickson  ZOX:[id number] DOB: March 13, 1928 DOA: 03/10/2017 PCP: [name], [name], MD   Brief Narrative:  83 y.o.WF ALF resident PMHx HTN, HLD, remote left hip fracture, DM type 2, Dementia, Depression, Chronic Systolic CHF (EF 81-19%25-30% 5/18), Afib on Coumadin , Chronic anemia (baseline appears to be 11-12),   Who presented to [facility] with leg pain after several falls, as well as 24hrs of confusion and lethargy.   [facility] ED eval noted a new L cerebellar CVA, and xrays noted a comminuted fx of the distal femor metaphysis on the L.  [facility].     Subjective: 6/22 A/O 1 (does not know where, when, why), negative CP, negative abdominal pain    Assessment & Plan:   Principal Problem:   Cerebellar infarct (HCC) Active Problems:   Hypertension   Diabetes mellitus type II, non insulin dependent (HCC)   Hyperlipidemia   Atrial fibrillation (HCC)   Anemia   Congestive heart failure, unspecified   Other closed fracture of lower end of femur   Hyperkalemia   Acute renal failure superimposed on stage 4 chronic kidney disease (HCC)   Hyperbilirubinemia   Dementia   Pain in joint, shoulder region   Hypothyroidism   Acute respiratory failure with hypoxia (HCC)   Jaundice   Elevated lactic acid level   Hypotension, unspecified   Metabolic encephalopathy   Unspecified cerebral artery occlusion with cerebral infarction   Acute kidney failure, unspecified (HCC)   Chronic combined systolic and diastolic heart failure (HCC)   Acidosis   Anticoagulant long-term use   Closed fracture of left femur (HCC)   Severe Hypothyroidism / ?Myxedema coma  -TSH severely elevated  Recent Labs Lab 03/10/17 1509 03/13/17 0204  TSH 171.577* 169.681*  - Free T4 is low at 0.35 - pt is relatively hypothermic and hypotensive  - will tx for myxedema and recheck TSH in AM for confirmation  - utilize T4 and T3 until sx greatly improved then  transition to T4 only  - cortisol is normal therefore hydrocortisone not indicated   Dementia  -Family clarifies the patient had no cognitive deficits until early this year when they developed suddenly -follow clinically with treatment of myxedema  Cerebellar CVA?  -ruled out by MRI -Stroke Team is directing care - it is possible her wavering mental status may be due to myxedema - follow w/ tx   Chronic systolic CHF  - EF 25-30% via TTE May 2018 -Strict I&O since admission +9 L -Daily weight  Filed Weights   03/12/17 0425 03/13/17 0355 03/14/17 0458  Weight: 205 lb 0.4 oz (93 kg) 209 lb 10.5 oz (95.1 kg) 216 lb 4.3 oz (98.1 kg)  -Transfuse for hemoglobin<8  Chronic Atrial Fib with RVR (CHA2DS2 - VASc is 8)  - resume anticoagulation (discussed with family)  -Amiodarone drip -Hold all nodal blocking agents secondary to hypotension - follow rate on telemetry with relatively high dose thyroid replacement therapy being initiated   Hypotension -Multifactorial to include systolic CHF, A. fib with RVR Myxedema  -Normal saline 2550ml/hr -Dopamine drip; MAP goal>65  Acute renal failure on CKD stage 4(Baseline Cr 1.68)  - likely pre-renal azotemia due to intermittent hypotension  - follow w/ tx of myxedema and volume expansion  - avoid ACEi/ARB Lab Results  Component Value Date   CREATININE 3.85 (H) 03/14/2017   CREATININE 3.66 (H) 03/13/2017   CREATININE 3.45 (H) 03/13/2017  -Renal  artery ultrasound pending -If Cr continues to worsen renal consult (hepatorenal syndrome?)  -Patient currently fluid overloaded (+9 L since admission): Bilateral pedal edema to waist  Hyperbilirubinemia/Acute Liver Failure with hepatic coma/Liver cirrhosis  -?Cirrhosis vs Demand ischemia? vs Myxedema  -Worsening liver enzymes.  -Abdominal ultrasound consistent with liver cirrhosis see results below.   DM type 2 controlled with Renal complications -6/19 Hemoglobin A1c= 6.0  Left distal femur  fracture -Care as per Orthopedics - no plans for the OR at present  Chronic anemia due to CKD stage IV  -Most likely related to chronic kidney disease Recent Labs Lab 03/11/17 0543 03/11/17 1028 03/11/17 1633 03/12/17 0219 03/13/17 0204 03/14/17 0245  HGB 8.2* 8.9* 8.8* 8.7* 8.2* 8.1*   Goals of care -Spoke at length with son and daughter concerning goals of care, and CODE STATUS. They would like to think over next 24-48 hours whether to keep patient full code or make DO NOT RESUSCITATE   DVT prophylaxis: lovenox > warfarin  Code Status: FULL CODE Family Communication: Son and daughter Disposition Plan: TBD   Consultants:  Orthopedics Neurology    Procedures/Significant Events:  6/18 CT head W Wo contrast:-Recent appearing infarct in the inferior right cerebellar hemisphere. Suspect infarct involving a portion of the right posterior inferior cerebral artery distribution.  - underlying atrophy with mild periventricular small vessel disease.  6/18 DG hip unilateral or W/O pelvis:-Frontal pelvis as well as frontal and lateral left hip images or obtained. -Postoperative change in the proximal left femur.-Tip of screw is in the proximal femoral head.  6/18 DG femur:-Comminuted fracture distal femoral metaphysis with posterior displacement of distal major fracture fragment with respect to proximal major fragment 6/20 MRI/MRA brain:-Possible small remote lacunar infarct noted within the right thalamus -No large or proximal arterial branch occlusion within the intracranial circulation. No obvious high-grade or correctable stenosis. 6/20 RUQ abdominal ultrasound:Cholelithiasis without evidence of acute cholecystitis. - Fatty liver with findings suggestive of cirrhosis. -Small ascites.    VENTILATOR SETTINGS:    Cultures 6/18 MRSA by PCR negative 6/19 blood NGTD 6/20 urine negative final    Antimicrobials: Anti-infectives    Start     Stop   03/12/17 0045   cefTRIAXone (ROCEPHIN) 1 g in dextrose 5 % 50 mL IVPB  Status:  Discontinued     03/12/17 1620       Devices   LINES / TUBES:      Continuous Infusions: . sodium chloride 100 mL/hr at 03/13/17 0840  . methocarbamol (ROBAXIN)  IV 500 mg (03/12/17 1856)     Objective: Vitals:   03/13/17 1600 03/13/17 2000 03/14/17 0055 03/14/17 0458  BP: (!) 143/89 (!) 151/100 (!) 145/103 (!) 156/137  Pulse: 95  (!) 124 (!) 125  Resp: 15  16 (!) 22  Temp: 97.6 F (36.4 C) 97.4 F (36.3 C) 97.5 F (36.4 C) 97.6 F (36.4 C)  TempSrc: Axillary Axillary Axillary Axillary  SpO2: 90% 95% 96% 93%  Weight:    216 lb 4.3 oz (98.1 kg)  Height:        Intake/Output Summary (Last 24 hours) at 03/14/17 0722 Last data filed at 03/14/17 0458  Gross per 24 hour  Intake          1953.33 ml  Output              125 ml  Net          1828.33 ml   Filed Weights   03/12/17 0425 03/13/17 0355  03/14/17 0458  Weight: 205 lb 0.4 oz (93 kg) 209 lb 10.5 oz (95.1 kg) 216 lb 4.3 oz (98.1 kg)    Examination:  General: A/O 1 (does not know where, why, when per RN waxes and wanes) No acute respiratory distress Eyes: negative scleral hemorrhage, negative anisocoria, positive icterus ENT: Negative Runny nose, negative gingival bleeding, poor dentation Neck:  Negative scars, masses, torticollis, lymphadenopathy, JVD Lungs: Clear to auscultation bilaterally without wheezes or crackles Cardiovascular: Irregular irregular rhythm and rate without murmur gallop or rub normal S1 and S2 Abdomen: Morbidly obese, negative abdominal pain, nondistended, positive soft, bowel sounds, no rebound, no ascites, no appreciable mass Extremities: positive bilateral lower extremity edema 2-3+ to the hips, left leg in a soft immobilizer,  Skin: Jaundice  Psychiatric:  Unable to evaluate secondary to patient's altered mental status  Central nervous system:  Spontaneously moves all extremities, withdraws to painful stimuli   .      Data Reviewed: Care during the described time interval was provided by me .  I have reviewed this patient's available data, including medical history, events of note, physical examination, and all test results as part of my evaluation. I have personally reviewed and interpreted all radiology studies.  CBC:  Recent Labs Lab 03/10/17 1509  03/11/17 1028 03/11/17 1633 03/12/17 0219 03/13/17 0204 03/14/17 0245  WBC 6.5  < > 7.4 9.8 7.9 8.3 8.9  NEUTROABS 4.7  --   --   --   --   --   --   HGB 8.3*  < > 8.9* 8.8* 8.7* 8.2* 8.1*  HCT 25.0*  < > 27.7* 26.8* 27.0* 25.5* 25.4*  MCV 105.9*  < > 106.9* 107.2* 108.0* 107.6* 109.5*  PLT 105*  < > 121* 142* 121* 131* 118*  < > = values in this interval not displayed. Basic Metabolic Panel:  Recent Labs Lab 03/11/17 1028 03/11/17 1859 03/13/17 0204 03/13/17 1902 03/14/17 0245  NA 139 140 141 140 141  K 5.6* 5.5* 5.3* 4.8 5.2*  CL 103 103 105 106 108  CO2 23 21* 25 24 21*  GLUCOSE 110* 83 153* 135* 118*  BUN 58* 58* 64* 65* 67*  CREATININE 2.78* 2.89* 3.45* 3.66* 3.85*  CALCIUM 8.8* 8.7* 8.0* 7.8* 7.9*   GFR: Estimated Creatinine Clearance: 12.2 mL/min (A) (by C-G formula based on SCr of 3.85 mg/dL (H)). Liver Function Tests:  Recent Labs Lab 03/10/17 1509 03/11/17 1859 03/13/17 0204 03/14/17 0245  AST 42* 56* 114* 222*  ALT 29 35 71* 120*  ALKPHOS 124 120 104 104  BILITOT 4.4* 5.4* 6.8* 10.6*  PROT 5.6* 5.5* 4.9* 4.9*  ALBUMIN 3.4* 3.1* 2.8* 2.8*    Recent Labs Lab 03/11/17 1859  LIPASE 20    Recent Labs Lab 03/13/17 0204 03/13/17 1903  AMMONIA 30 25   Coagulation Profile:  Recent Labs Lab 03/11/17 1147 03/11/17 1900 03/12/17 0219 03/13/17 0204 03/14/17 0245  INR 1.55 1.57 1.56 1.76 1.76   Cardiac Enzymes:  Recent Labs Lab 03/12/17 0219  TROPONINI 0.06*   BNP (last 3 results) No results for input(s): PROBNP in the last 8760 hours. HbA1C:  Recent Labs  03/11/17 1845  HGBA1C 6.0*    CBG:  Recent Labs Lab 03/12/17 2115 03/13/17 0725 03/13/17 1135 03/13/17 1710 03/13/17 2140  GLUCAP 156* 136* 109* 121* 145*   Lipid Profile: No results for input(s): CHOL, HDL, LDLCALC, TRIG, CHOLHDL, LDLDIRECT in the last 72 hours. Thyroid Function Tests:  Recent Labs  03/12/17  0219 03/13/17 0204  TSH  --  169.681*  FREET4 0.35*  --    Anemia Panel:  Recent Labs  03/13/17 0204  VITAMINB12 1,845*  FOLATE 17.1  FERRITIN 305  TIBC 281  IRON 50  RETICCTPCT 6.1*   Urine analysis:    Component Value Date/Time   COLORURINE AMBER (A) 03/12/2017 0941   APPEARANCEUR HAZY (A) 03/12/2017 0941   LABSPEC 1.014 03/12/2017 0941   PHURINE 5.0 03/12/2017 0941   GLUCOSEU NEGATIVE 03/12/2017 0941   HGBUR NEGATIVE 03/12/2017 0941   BILIRUBINUR NEGATIVE 03/12/2017 0941   KETONESUR NEGATIVE 03/12/2017 0941   PROTEINUR 30 (A) 03/12/2017 0941   NITRITE NEGATIVE 03/12/2017 0941   LEUKOCYTESUR MODERATE (A) 03/12/2017 0941   Sepsis Labs: @LABRCNTIP (procalcitonin:4,lacticidven:4)  ) Recent Results (from the past 240 hour(s))  Surgical pcr screen     Status: None   Collection Time: 03/10/17 11:18 PM  Result Value Ref Range Status   MRSA, PCR NEGATIVE NEGATIVE Final   Staphylococcus aureus NEGATIVE NEGATIVE Final    Comment:        The Xpert SA Assay (FDA approved for NASAL specimens in patients over 60 years of age), is one component of a comprehensive surveillance program.  Test performance has been validated by [facility] for patients greater than or equal to 74 year old. It is not intended to diagnose infection nor to guide or monitor treatment.   Culture, blood (Routine X 2) w Reflex to ID Panel     Status: None (Preliminary result)   Collection Time: 03/11/17  6:38 PM  Result Value Ref Range Status   Specimen Description BLOOD LEFT HAND  Final   Special Requests IN PEDIATRIC BOTTLE Blood Culture adequate volume  Final   Culture NO GROWTH 2 DAYS  Final    Report Status PENDING  Incomplete  Culture, blood (Routine X 2) w Reflex to ID Panel     Status: None (Preliminary result)   Collection Time: 03/11/17  6:59 PM  Result Value Ref Range Status   Specimen Description BLOOD RIGHT HAND  Final   Special Requests IN PEDIATRIC BOTTLE Blood Culture adequate volume  Final   Culture NO GROWTH 2 DAYS  Final   Report Status PENDING  Incomplete  Culture, Urine     Status: None   Collection Time: 03/12/17  9:41 AM  Result Value Ref Range Status   Specimen Description URINE, RANDOM  Final   Special Requests NONE  Final   Culture NO GROWTH  Final   Report Status 03/13/2017 FINAL  Final         Radiology Studies: US Renal  Result Date: 03/13/2017 CLINICAL DATA:  Acute renal failure. EXAM: RENAL / URINARY TRACT ULTRASOUND COMPLETE COMPARISON:  None. FINDINGS: Right Kidney: Length: 9 cm. 15 mm nonobstructive calculus is seen in upper pole. Echogenicity within normal limits. No mass or hydronephrosis visualized. Left Kidney: Length: 9.8 cm. 1.3 cm simple cyst is noted in midpole. Echogenicity within normal limits. No mass or hydronephrosis visualized. Bladder: Decompressed secondary to Foley catheter. Small right pleural effusion is noted as well as minimal ascites. IMPRESSION: Nonobstructive right renal calculus. No hydronephrosis or renal obstruction is noted. Small right pleural effusion and minimal ascites. Electronically Signed   By: Lupita Raider, M.D.   On: 03/13/2017 10:37   Dg Chest Port 1 View  Result Date: 03/13/2017 CLINICAL DATA:  Altered mental status, history atrial fibrillation, CHF, diabetes mellitus, hypertension EXAM: PORTABLE CHEST 1 VIEW COMPARISON:  Portable exam  1142 hours compared 03/11/2017 FINDINGS: Enlargement of cardiac silhouette. Mediastinal contours and pulmonary vascularity normal. Atherosclerotic calcification aorta. Persistent LEFT basilar consolidation and pleural effusion. Remaining lungs clear. No pneumothorax. Bones  demineralized with degenerative changes at RIGHT glenohumeral joint. IMPRESSION: Enlargement of cardiac silhouette. Persistent LEFT basilar consolidation and pleural effusion. Aortic Atherosclerosis (ICD10-I70.0). Electronically Signed   By: Ulyses Southward M.D.   On: 03/13/2017 12:02        Scheduled Meds: . atorvastatin  40 mg Oral QPM  . chlorhexidine  15 mL Mouth Rinse BID  . enoxaparin (LOVENOX) injection  90 mg Subcutaneous Q24H  . insulin aspart  0-9 Units Subcutaneous TID WC  . levothyroxine  50 mcg Intravenous Daily  . liothyronine  5 mcg Oral Q8H  . mouth rinse  15 mL Mouth Rinse BID  . Warfarin - Pharmacist Dosing Inpatient   Does not apply q1800   Continuous Infusions: . sodium chloride 100 mL/hr at 03/13/17 0840  . methocarbamol (ROBAXIN)  IV 500 mg (03/12/17 1856)     LOS: 4 days    Time spent: 40 minutes    WOODS, Roselind Messier, MD [facility] Pager [phone]   If 7PM-7AM, please contact night-coverage [email or web address] Password [email or web address] 03/14/2017, 7:22 AM

## 2017-03-14 NOTE — Clinical Social Work Note (Signed)
CSW continues to follow for discharge needs.  Pegah Gervis Gaba, CSW [phone]  

## 2017-03-14 NOTE — Progress Notes (Signed)
Peripherally Inserted Central Catheter/Midline Placement  The IV Nurse has discussed with the patient and/or persons authorized to consent for the patient, the purpose of this procedure and the potential benefits and risks involved with this procedure.  The benefits include less needle sticks, lab draws from the catheter, and the patient may be discharged home with the catheter. Risks include, but not limited to, infection, bleeding, blood clot (thrombus formation), and puncture of an artery; nerve damage and irregular heartbeat and possibility to perform a PICC exchange if needed/ordered by physician.  Alternatives to this procedure were also discussed.  Bard Power PICC patient education guide, fact sheet on infection prevention and patient information card has been provided to patient /or left at bedside.  Consent signed by son.  PICC/Midline Placement Documentation  PICC Triple Lumen 03/14/17 PICC Right Basilic 39 cm 0 cm (Active)  Indication for Insertion or Continuance of Line Prolonged intravenous therapies;Poor Vasculature-patient has had multiple peripheral attempts or PIVs lasting less than 24 hours 03/14/2017  9:04 PM  Exposed Catheter (cm) 0 cm 03/14/2017  9:04 PM  Site Assessment Clean;Dry;Intact 03/14/2017  9:04 PM  Lumen #1 Status Flushed;Saline locked;Blood return noted 03/14/2017  9:04 PM  Lumen #2 Status Flushed;Saline locked;Blood return noted 03/14/2017  9:04 PM  Lumen #3 Status Flushed;Saline locked;Blood return noted 03/14/2017  9:04 PM  Dressing Type Transparent 03/14/2017  9:04 PM  Dressing Status Clean;Dry;Intact 03/14/2017  9:04 PM  Dressing Change Due 03/16/17 03/14/2017  9:04 PM       Ethelda Chickurrie, Kaliel Bolds Robert 03/14/2017, 9:06 PM

## 2017-03-14 NOTE — Progress Notes (Signed)
Nutrition Follow-up  DOCUMENTATION CODES:   Obesity unspecified  INTERVENTION:    Mighty Shake II TID with meals, each supplement provides 500 kcals and 23 grams of protein  NUTRITION DIAGNOSIS:   Increased nutrient needs related to  (hip fracture) as evidenced by estimated needs.  Ongoing  GOAL:   Patient will meet greater than or equal to 90% of their needs  Progressing  MONITOR:   Diet advancement, PO intake, Labs, Weight trends, Skin, I & O's  REASON FOR ASSESSMENT:   Consult Hip fracture protocol  ASSESSMENT:   83 y.o. Female with medical history significant of32 HTN, HLD, remote left hip fracture, DM, Dementia/Depression, CHF (EF 25-30%, abnormal diastolic function of indeterminant grade in 5/18), Anemia (baseline appears to be 11-12), and afib on Coumadin presenting with leg pain after several falls.  Spoke with patient's son and daughter who report patient is eating a little better. They are bringing in soft foods for patient to eat.   Diet has been advanced to dysphagia 2 with nectar thick liquids. She is consuming 10-20% of meals.  Labs and medications reviewed. Potassium 5.2 (H), phosphorus 5.1 (H)  Diet Order:  DIET DYS 2 Room service appropriate? Yes; Fluid consistency: Nectar Thick  Skin:  Reviewed, no issues  Last BM:  6/21  Height:   Ht Readings from Last 1 Encounters:  03/10/17 5\' 5"  (1.651 m)    Weight:   Wt Readings from Last 1 Encounters:  03/14/17 216 lb 4.3 oz (98.1 kg)    Ideal Body Weight:  56.8 kg  BMI:  Body mass index is 35.99 kg/m.  Estimated Nutritional Needs:   Kcal:  1400-1600  Protein:  85-95 gm  Fluid:  >/= 1.5 L  EDUCATION NEEDS:   No education needs identified at this time  Joaquin CourtsKimberly Harris, RD, LDN, CNSC Pager [phone] After Hours Pager [phone]

## 2017-03-14 NOTE — Progress Notes (Signed)
ANTICOAGULATION CONSULT NOTE - Follow Up Consult  Pharmacy Consult for enoxaparin and warfarin Indication: atrial fibrillation  Allergies  Allergen Reactions  . Benzalkonium Chloride-Alcohol Other (See Comments)    Patient/family is not familiar with this allergy  . Codeine Itching and Nausea Only    Patient Measurements: Height: 5\' 5"  (165.1 cm) Weight: 216 lb 4.3 oz (98.1 kg) IBW/kg (Calculated) : 57  Vital Signs: Temp: 98.3 F (36.8 C) (06/22 0741) Temp Source: Oral (06/22 0741) BP: 75/33 (06/22 0748) Pulse Rate: 131 (06/22 0748)  Labs:  Recent Labs  03/12/17 0219 03/13/17 0204 03/13/17 1902 03/14/17 0245  HGB 8.7* 8.2*  --  8.1*  HCT 27.0* 25.5*  --  25.4*  PLT 121* 131*  --  118*  LABPROT 18.8* 20.7*  --  20.7*  INR 1.56 1.76  --  1.76  CREATININE  --  3.45* 3.66* 3.85*  TROPONINI 0.06*  --   --   --     Estimated Creatinine Clearance: 12.2 mL/min (A) (by C-G formula based on SCr of 3.85 mg/dL (H)).   Medications:  Scheduled:  . atorvastatin  40 mg Oral QPM  . chlorhexidine  15 mL Mouth Rinse BID  . enoxaparin (LOVENOX) injection  90 mg Subcutaneous Q24H  . insulin aspart  0-9 Units Subcutaneous TID WC  . levothyroxine  50 mcg Intravenous Daily  . liothyronine  5 mcg Oral Q8H  . mouth rinse  15 mL Mouth Rinse BID  . Warfarin - Pharmacist Dosing Inpatient   Does not apply q1800    Assessment: 83 yo F admitted 03/10/2017 with distal femur fracture on warfarin PTA. On admission patient's INR was reveresed with Kcentra and 10 mg IV vitamin K. [facility] and has elected for nonsurgical management of fracture. Pharmacy consulted to dose warfarin and bridge with enoxaparin.  *Home dose 3.75mg  daily except 2.5mg  TTSun  INR 1.76 (subtherapeutic), Hgb 8.1, plts 118. No signs/symptoms of bleeding noted.  Goal of Therapy:  INR 2-3 Anti-Xa level 0.6-1 units/ml 4hrs after LMWH dose given Monitor platelets by anticoagulation protocol: Yes    Plan:  - Continue Lovenox 90 mg SQ q24h - Warfarin 3 mg PO x1 tonight - Monitor daily INR, CBC and signs/symptoms of bleeding  Casilda Carlsaylor Willa Brocks, PharmD, BCPS PGY-2 Infectious Diseases Pharmacy Resident Pager: [phone] 03/14/2017,9:31 AM

## 2017-03-15 LAB — COMPREHENSIVE METABOLIC PANEL
ALT: 297 U/L — ABNORMAL HIGH (ref 14–54)
ANION GAP: 12 (ref 5–15)
AST: 545 U/L — AB (ref 15–41)
Albumin: 2.8 g/dL — ABNORMAL LOW (ref 3.5–5.0)
Alkaline Phosphatase: 117 U/L (ref 38–126)
BUN: 67 mg/dL — ABNORMAL HIGH (ref 6–20)
CHLORIDE: 108 mmol/L (ref 101–111)
CO2: 20 mmol/L — ABNORMAL LOW (ref 22–32)
Calcium: 7.3 mg/dL — ABNORMAL LOW (ref 8.9–10.3)
Creatinine, Ser: 3.99 mg/dL — ABNORMAL HIGH (ref 0.44–1.00)
GFR, EST AFRICAN AMERICAN: 11 mL/min — AB (ref >60)
GFR, EST NON AFRICAN AMERICAN: 9 mL/min — AB (ref >60)
Glucose, Bld: 171 mg/dL — ABNORMAL HIGH (ref 65–99)
POTASSIUM: 4.7 mmol/L (ref 3.5–5.1)
Sodium: 140 mmol/L (ref 135–145)
Total Bilirubin: 15.8 mg/dL — ABNORMAL HIGH (ref 0.3–1.2)
Total Protein: 4.9 g/dL — ABNORMAL LOW (ref 6.5–8.1)

## 2017-03-15 LAB — MAGNESIUM: MAGNESIUM: 1.8 mg/dL (ref 1.7–2.4)

## 2017-03-15 LAB — CBC
HEMATOCRIT: 25.5 % — AB (ref 36.0–46.0)
Hemoglobin: 8.3 g/dL — ABNORMAL LOW (ref 12.0–15.0)
MCH: 35 pg — ABNORMAL HIGH (ref 26.0–34.0)
MCHC: 32.5 g/dL (ref 30.0–36.0)
MCV: 107.6 fL — ABNORMAL HIGH (ref 78.0–100.0)
PLATELETS: 120 10*3/uL — AB (ref 150–400)
RBC: 2.37 MIL/uL — ABNORMAL LOW (ref 3.87–5.11)
RDW: 24.4 % — AB (ref 11.5–15.5)
WBC: 8.8 10*3/uL (ref 4.0–10.5)

## 2017-03-15 LAB — GLUCOSE, CAPILLARY
GLUCOSE-CAPILLARY: 153 mg/dL — AB (ref 65–99)
GLUCOSE-CAPILLARY: 160 mg/dL — AB (ref 65–99)
Glucose-Capillary: 161 mg/dL — ABNORMAL HIGH (ref 65–99)

## 2017-03-15 LAB — PHOSPHORUS: Phosphorus: 5.2 mg/dL — ABNORMAL HIGH (ref 2.5–4.6)

## 2017-03-15 LAB — PROTIME-INR
INR: 1.77
Prothrombin Time: 20.8 seconds — ABNORMAL HIGH (ref 11.4–15.2)

## 2017-03-15 LAB — BILIRUBIN, DIRECT: BILIRUBIN DIRECT: 9.6 mg/dL — AB (ref 0.1–0.5)

## 2017-03-15 LAB — HEPARIN ANTI-XA: HEPARIN LMW: 0.52 [IU]/mL

## 2017-03-15 MED ORDER — WARFARIN SODIUM 3 MG PO TABS
3.0000 mg | ORAL_TABLET | Freq: Once | ORAL | Status: DC
  Filled 2017-03-15: qty 1

## 2017-03-15 MED ORDER — SODIUM CHLORIDE 0.9% FLUSH: 10.0000 mL | Freq: Two times a day (BID) | INTRAVENOUS | Status: DC

## 2017-03-15 MED ORDER — LEVOTHYROXINE SODIUM 100 MCG IV SOLR
50.0000 ug | Freq: Every day | INTRAVENOUS | Status: DC
  Administered 2017-03-16: 50 ug via INTRAVENOUS
  Filled 2017-03-15: qty 5

## 2017-03-15 MED ORDER — SODIUM CHLORIDE 0.9% FLUSH: 10.0000 mL | INTRAVENOUS | Status: DC | PRN

## 2017-03-15 MED ORDER — LEVOTHYROXINE SODIUM 100 MCG PO TABS
100.0000 ug | ORAL_TABLET | Freq: Every day | ORAL | Status: DC
  Filled 2017-03-15: qty 1

## 2017-03-15 MED ORDER — ENOXAPARIN SODIUM 100 MG/ML ~~LOC~~ SOLN
100.0000 mg | SUBCUTANEOUS | Status: DC
  Administered 2017-03-15: 100 mg via SUBCUTANEOUS
  Filled 2017-03-15: qty 1

## 2017-03-15 MED ORDER — WHITE PETROLATUM GEL
Status: AC
  Administered 2017-03-15: 21:00:00
  Filled 2017-03-15: qty 1

## 2017-03-15 NOTE — Progress Notes (Signed)
ANTICOAGULATION CONSULT NOTE  Pharmacy Consult for enoxaparin Indication: atrial fibrillation  Allergies  Allergen Reactions  . Benzalkonium Chloride-Alcohol Other (See Comments)    Patient/family is not familiar with this allergy  . Codeine Itching and Nausea Only    Patient Measurements: Height: 5\' 5"  (165.1 cm) Weight: 231 lb 1.6 oz (104.8 kg) IBW/kg (Calculated) : 57  Vital Signs: Temp: 97.6 F (36.4 C) (06/23 1924) Temp Source: Axillary (06/23 1924) BP: 124/71 (06/23 1924) Pulse Rate: 112 (06/23 1924)  Labs:  Recent Labs  03/13/17 0204 03/13/17 1902 03/14/17 0245 03/15/17 1055 03/15/17 2217  HGB 8.2*  --  8.1* 8.3*  --   HCT 25.5*  --  25.4* 25.5*  --   PLT 131*  --  118* 120*  --   LABPROT 20.7*  --  20.7* 20.8*  --   INR 1.76  --  1.76 1.77  --   HEPRLOWMOCWT  --   --   --   --  0.52  CREATININE 3.45* 3.66* 3.85* 3.99*  --     Estimated Creatinine Clearance: 12.2 mL/min (A) (by C-G formula based on SCr of 3.99 mg/dL (H)).  Assessment: 83 yo Female with h/o Afib, Coumadin on hold and INR subtherapeutic, for Lovenox.  Peak level tonight slightly below goal.    Goal of Therapy:  INR 2-3 Anti-Xa level 0.6-1 units/ml 4hrs after LMWH dose given Monitor platelets by anticoagulation protocol: Yes   Plan:  Since patient has received just one day of a higher dose of Lovenox, and SCr has not yet stopped rising, will not change Lovenox at this time as level may increase with additional doses  Continue Lovenox 100 mg SQ q24h F/U renal function   Geannie RisenGreg Cleatus Goodin, PharmD, BCPS 03/15/2017 11:14 PM

## 2017-03-15 NOTE — Progress Notes (Signed)
ANTICOAGULATION CONSULT NOTE - Follow Up Consult  Pharmacy Consult for enoxaparin and warfarin Indication: atrial fibrillation  Allergies  Allergen Reactions  . Benzalkonium Chloride-Alcohol Other (See Comments)    Patient/family is not familiar with this allergy  . Codeine Itching and Nausea Only    Patient Measurements: Height: 5\' 5"  (165.1 cm) Weight: 231 lb 1.6 oz (104.8 kg) IBW/kg (Calculated) : 57  Vital Signs: Temp: 97.8 F (36.6 C) (06/23 1139) Temp Source: Oral (06/23 1139) BP: 113/90 (06/23 1139) Pulse Rate: 85 (06/23 1139)  Labs:  Recent Labs  03/13/17 0204 03/13/17 1902 03/14/17 0245 03/15/17 1055  HGB 8.2*  --  8.1* 8.3*  HCT 25.5*  --  25.4* 25.5*  PLT 131*  --  118* 120*  LABPROT 20.7*  --  20.7* 20.8*  INR 1.76  --  1.76 1.77  CREATININE 3.45* 3.66* 3.85* 3.99*    Estimated Creatinine Clearance: 12.2 mL/min (A) (by C-G formula based on SCr of 3.99 mg/dL (H)).   Medications:  Scheduled:  . atorvastatin  40 mg Oral QPM  . chlorhexidine  15 mL Mouth Rinse BID  . enoxaparin (LOVENOX) injection  100 mg Subcutaneous Q24H  . insulin aspart  0-9 Units Subcutaneous TID WC  . [START ON 03/16/2017] levothyroxine  100 mcg Oral QAC breakfast  . liothyronine  5 mcg Oral Q8H  . mouth rinse  15 mL Mouth Rinse BID  . sodium chloride flush  10-40 mL Intracatheter Q12H  . warfarin  3 mg Oral ONCE-1800  . Warfarin - Pharmacist Dosing Inpatient   Does not apply q1800    Assessment: 83 yo F admitted 03/10/2017 with distal femur fracture on warfarin PTA. On admission patient's INR was reveresed with Kcentra and 10 mg IV vitamin K. [facility] and has elected for nonsurgical management of fracture. Pharmacy consulted to dose warfarin and bridge with enoxaparin.  *Home dose 3.75mg  daily except 2.5mg  TTSun  -INR 1.77 (subtherapeutic and stable over last few days), Hgb 8.3, plts 109 (both stable). No signs/symptoms of bleeding noted. -LFTs continue to  rise with AST/ALT 222/120>>545/297, Tbili 6.8>10.6>15.8 -Weight has also increased slightly to 104.8kg -SCr remains elevated 3.85>>3.99 (BL ~1.8)  Goal of Therapy:  INR 2-3 Anti-Xa level 0.6-1 units/ml 4hrs after LMWH dose given Monitor platelets by anticoagulation protocol: Yes   Plan:  - Lovenox 100 mg SQ q24h - Anti-Xa level 4 hours post dose tonight at 2200 - Warfarin 3 mg PO x1 tonight - Monitor daily INR, CBC and signs/symptoms of bleeding  Gwyndolyn KaufmanKai Nakoma Gotwalt Bernette Redbird(Kenny), PharmD  PGY1 Pharmacy Resident Pager: [phone] 03/15/2017 11:51 AM

## 2017-03-15 NOTE — Progress Notes (Signed)
[place] TEAM 1 - Stepdown/ICU TEAM  Glenda SowSarah T Terlecki  ZOX:[id number] DOB: 1927-12-06 DOA: 03/10/2017 PCP: [name], [name], MD    Brief Narrative:  83 y.o. female ALF resident with history of HTN, HLD, remote left hip fracture, DM, Depression, Systolic CHF (EF 81-19%25-30% 5/18), Chronic anemia (baseline appears to be 11-12), and Afib on Coumadin who presented to [facility] ED with leg pain after several falls, as well as 24hrs of confusion and lethargy.    [facility] ED eval noted a potential new L cerebellar CVA, and xrays noted a comminuted fx of the distal femor metaphysis on the L.  [facility] pt [facility].    Subjective: The pt is much less responsive today.  She will open her eyes to painful stimuli but does not participate in meaningful conversation.  She is significantly more jaundiced than when I last saw her.  She does not appear to be in respiratory distress at the time of visit nor does she appear to be in uncontrolled pain when she is not being disturbed.  Assessment & Plan:  Severe Hypothyroidism / ?Myxedema coma  TSH markedly elevated at 171 - Free T4 low at 0.35 - pt is relatively hypothermic and hypotensive - tx for myxedema w/ bothT4 and T3 until sx greatly improved then transition to T4 only - cortisol is normal therefore hydrocortisone not indicated   ?Cerebellar CVA - ruled out by MRI Stroke Team evaluated - MRI is NOT c/w CVA  Chronic systolic CHF - EF 25-30% via TTE May 2018 Now appears to be modestly volume overloaded, w/ peripheral edema - net +~10.5L since admit - will attempt to avoid diuresis for now given hypotension - stop volume resuscitation    Filed Weights   03/13/17 0355 03/14/17 0458 03/15/17 0400  Weight: 95.1 kg (209 lb 10.5 oz) 98.1 kg (216 lb 4.3 oz) 104.8 kg (231 lb 1.6 oz)    AKI on CKD stage 4 Baseline crt 1.68 - ?ATN due to hypotension - has not responded to volume expansion - renal US w/o acute reversible findings - perhaps Crt is  plateuing now - cont to follow - given advanced age an current severe acute comorbidities she would not be a candidate for HD therefore will not consult Renal at this time    Recent Labs Lab 03/11/17 1859 03/13/17 0204 03/13/17 1902 03/14/17 0245 03/15/17 1055  CREATININE 2.89* 3.45* 3.66* 3.85* 3.99*   Hypotension ?progression of heart failure to end stage - ?myxedema - currently on low dose dopa support, but this does not appear to be improving hepatic or renal failure - appears volume overloaded presently   Hyperbilirubinemia - Cirrhosis / NASH - acute hepatic failure (shock liver?) abdom US notes cirrhosis, likely NASH related - direct (conjugated) bili markedly increased - pt has cholelithiasis ("tiny stones") noted on abdom US but no evidence of ductal dilatation - suspect this is shock liver in the setting of a baseline of impaired liver fxn due to NASH cirrhosis   DM2 CBG Reasonably controlled  Chronic Atrial Fib CHA2DS2 - VASc is 8 - resumed anticoagulation (discussed with family) - amiodarone being dosed for RVR with heart rate not at goal - avoid further escalation of amiodarone in setting of liver failure - digoxin a poor choice given renal failure - hold oral anticoagulation and use Lovenox only for now as I suspect oral intake will be quite limited  Left distal femur fracture Care as per Orthopedics - much  too fragile to consider surgery at this time  Dementia  Family clarifies the patient had no cognitive deficits until early this year when they developed suddenly - follow clinically with treatment of myxedema  Chronic anemia  Most likely related to chronic kidney disease - no indication for transfusion at this time  Obesity - Body mass index is 38.46 kg/m.   DVT prophylaxis: lovenox Code Status: FULL CODE Family Communication: Spoke with extended family at bedside  Disposition Plan: SDU  Consultants:   Orthopedics Neurology  Procedures: None  Antimicrobials:  None   Objective: Blood pressure 113/90, pulse 85, temperature 97.8 F (36.6 C), temperature source Oral, resp. rate 12, height 5\' 5"  (1.651 m), weight 104.8 kg (231 lb 1.6 oz), SpO2 100 %.  Intake/Output Summary (Last 24 hours) at 03/15/17 1441 Last data filed at 03/15/17 0900  Gross per 24 hour  Intake           2275.6 ml  Output              125 ml  Net           2150.6 ml   Filed Weights   03/13/17 0355 03/14/17 0458 03/15/17 0400  Weight: 95.1 kg (209 lb 10.5 oz) 98.1 kg (216 lb 4.3 oz) 104.8 kg (231 lb 1.6 oz)    Examination: General: Markedly jaundiced - lethargic - no acute respiratory distress Lungs: Fine crackles throughout all fields with no wheezing Cardiovascular: Irregularly irregular and tachycardic with heart rate approximately 110 Abdomen: Mildly protuberant, soft, bowel sounds not appreciated, no rebound Extremities: 1+ bilateral lower extremity edema  CBC:  Recent Labs Lab 03/10/17 1509  03/11/17 1633 03/12/17 0219 03/13/17 0204 03/14/17 0245 03/15/17 1055  WBC 6.5  < > 9.8 7.9 8.3 8.9 8.8  NEUTROABS 4.7  --   --   --   --   --   --   HGB 8.3*  < > 8.8* 8.7* 8.2* 8.1* 8.3*  HCT 25.0*  < > 26.8* 27.0* 25.5* 25.4* 25.5*  MCV 105.9*  < > 107.2* 108.0* 107.6* 109.5* 107.6*  PLT 105*  < > 142* 121* 131* 118* 120*  < > = values in this interval not displayed. Basic Metabolic Panel:  Recent Labs Lab 03/11/17 1859 03/13/17 0204 03/13/17 1902 03/14/17 0245 03/14/17 1206 03/15/17 1055  NA 140 141 140 141  --  140  K 5.5* 5.3* 4.8 5.2*  --  4.7  CL 103 105 106 108  --  108  CO2 21* 25 24 21*  --  20*  GLUCOSE 83 153* 135* 118*  --  171*  BUN 58* 64* 65* 67*  --  67*  CREATININE 2.89* 3.45* 3.66* 3.85*  --  3.99*  CALCIUM 8.7* 8.0* 7.8* 7.9*  --  7.3*  MG  --   --   --   --  2.0 1.8  PHOS  --   --   --   --  5.1* 5.2*   GFR: Estimated Creatinine Clearance: 12.2 mL/min (A) (by C-G  formula based on SCr of 3.99 mg/dL (H)).  Liver Function Tests:  Recent Labs Lab 03/10/17 1509 03/11/17 1859 03/13/17 0204 03/14/17 0245 03/15/17 1055  AST 42* 56* 114* 222* 545*  ALT 29 35 71* 120* 297*  ALKPHOS 124 120 104 104 117  BILITOT 4.4* 5.4* 6.8* 10.6* 15.8*  PROT 5.6* 5.5* 4.9* 4.9* 4.9*  ALBUMIN 3.4* 3.1* 2.8* 2.8* 2.8*    Recent Labs Lab 03/11/17 1859  LIPASE 20   Coagulation Profile:  Recent Labs Lab 03/11/17 1900 03/12/17 0219 03/13/17 0204 03/14/17 0245 03/15/17 1055  INR 1.57 1.56 1.76 1.76 1.77    Cardiac Enzymes:  Recent Labs Lab 03/12/17 0219  TROPONINI 0.06*    HbA1C: Hgb A1c MFr Bld  Date/Time Value Ref Range Status  03/11/2017 06:45 PM 6.0 (H) 4.8 - 5.6 % Final    Comment:    (NOTE)         Pre-diabetes: 5.7 - 6.4         Diabetes: >6.4         Glycemic control for adults with diabetes: <7.0   09/29/2016 04:32 PM 5.8 (H) 4.8 - 5.6 % Final    Comment:    (NOTE)         Pre-diabetes: 5.7 - 6.4         Diabetes: >6.4         Glycemic control for adults with diabetes: <7.0     CBG:  Recent Labs Lab 03/14/17 1156 03/14/17 1544 03/14/17 2128 03/15/17 0803 03/15/17 1147  GLUCAP 118* 143* 114* 153* 160*    Recent Results (from the past 240 hour(s))  Surgical pcr screen     Status: None   Collection Time: 03/10/17 11:18 PM  Result Value Ref Range Status   MRSA, PCR NEGATIVE NEGATIVE Final   Staphylococcus aureus NEGATIVE NEGATIVE Final    Comment:        The Xpert SA Assay (FDA approved for NASAL specimens in patients over 34 years of age), is one component of a comprehensive surveillance program.  Test performance has been validated by [facility] for patients greater than or equal to 50 year old. It is not intended to diagnose infection nor to guide or monitor treatment.   Culture, blood (Routine X 2) w Reflex to ID Panel     Status: None (Preliminary result)   Collection Time: 03/11/17  6:38 PM  Result  Value Ref Range Status   Specimen Description BLOOD LEFT HAND  Final   Special Requests IN PEDIATRIC BOTTLE Blood Culture adequate volume  Final   Culture NO GROWTH 4 DAYS  Final   Report Status PENDING  Incomplete  Culture, blood (Routine X 2) w Reflex to ID Panel     Status: None (Preliminary result)   Collection Time: 03/11/17  6:59 PM  Result Value Ref Range Status   Specimen Description BLOOD RIGHT HAND  Final   Special Requests IN PEDIATRIC BOTTLE Blood Culture adequate volume  Final   Culture NO GROWTH 4 DAYS  Final   Report Status PENDING  Incomplete  Culture, Urine     Status: None   Collection Time: 03/12/17  9:41 AM  Result Value Ref Range Status   Specimen Description URINE, RANDOM  Final   Special Requests NONE  Final   Culture NO GROWTH  Final   Report Status 03/13/2017 FINAL  Final     Scheduled Meds: . atorvastatin  40 mg Oral QPM  . chlorhexidine  15 mL Mouth Rinse BID  . enoxaparin (LOVENOX) injection  100 mg Subcutaneous Q24H  . insulin aspart  0-9 Units Subcutaneous TID WC  . [START ON 03/16/2017] levothyroxine  100 mcg Oral QAC breakfast  . liothyronine  5 mcg Oral Q8H  . mouth rinse  15 mL Mouth Rinse BID  . sodium chloride flush  10-40 mL Intracatheter Q12H  . warfarin  3 mg Oral ONCE-1800  . Warfarin - Pharmacist  Dosing Inpatient   Does not apply q1800     LOS: 5 days   Lonia Blood, MD [facility] Office  [phone] Pager - Text Page per Amion as per below:  On-Call/Text Page:      [email or web address]      password [email or web address]  If 7PM-7AM, please contact night-coverage [email or web address] Password [facility] 03/15/2017, 2:41 PM

## 2017-03-15 NOTE — Plan of Care (Signed)
Problem: Skin Integrity: Goal: Risk for impaired skin integrity will decrease Outcome: Progressing Pt being turned q 2 and barrier cream applied

## 2017-03-16 LAB — PROTIME-INR
INR: 2.25
PROTHROMBIN TIME: 25.3 s — AB (ref 11.4–15.2)

## 2017-03-16 LAB — COMPREHENSIVE METABOLIC PANEL
ALBUMIN: 2.5 g/dL — AB (ref 3.5–5.0)
ALK PHOS: 119 U/L (ref 38–126)
ALT: 459 U/L — AB (ref 14–54)
ANION GAP: 9 (ref 5–15)
AST: 798 U/L — ABNORMAL HIGH (ref 15–41)
BUN: 70 mg/dL — ABNORMAL HIGH (ref 6–20)
CALCIUM: 7.1 mg/dL — AB (ref 8.9–10.3)
CHLORIDE: 108 mmol/L (ref 101–111)
CO2: 23 mmol/L (ref 22–32)
Creatinine, Ser: 4.03 mg/dL — ABNORMAL HIGH (ref 0.44–1.00)
GFR calc Af Amer: 11 mL/min — ABNORMAL LOW (ref >60)
GFR calc non Af Amer: 9 mL/min — ABNORMAL LOW (ref >60)
GLUCOSE: 157 mg/dL — AB (ref 65–99)
Potassium: 4.9 mmol/L (ref 3.5–5.1)
SODIUM: 140 mmol/L (ref 135–145)
Total Bilirubin: 16.8 mg/dL — ABNORMAL HIGH (ref 0.3–1.2)
Total Protein: 4.9 g/dL — ABNORMAL LOW (ref 6.5–8.1)

## 2017-03-16 LAB — CBC
HEMATOCRIT: 24.1 % — AB (ref 36.0–46.0)
HEMOGLOBIN: 7.9 g/dL — AB (ref 12.0–15.0)
MCH: 34.3 pg — ABNORMAL HIGH (ref 26.0–34.0)
MCHC: 32.8 g/dL (ref 30.0–36.0)
MCV: 104.8 fL — ABNORMAL HIGH (ref 78.0–100.0)
Platelets: 106 10*3/uL — ABNORMAL LOW (ref 150–400)
RBC: 2.3 MIL/uL — ABNORMAL LOW (ref 3.87–5.11)
RDW: 24.1 % — ABNORMAL HIGH (ref 11.5–15.5)
WBC: 7.5 10*3/uL (ref 4.0–10.5)

## 2017-03-16 LAB — GLUCOSE, CAPILLARY
GLUCOSE-CAPILLARY: 142 mg/dL — AB (ref 65–99)
GLUCOSE-CAPILLARY: 157 mg/dL — AB (ref 65–99)
Glucose-Capillary: 135 mg/dL — ABNORMAL HIGH (ref 65–99)
Glucose-Capillary: 140 mg/dL — ABNORMAL HIGH (ref 65–99)

## 2017-03-16 LAB — CULTURE, BLOOD (ROUTINE X 2)
Culture: NO GROWTH
Culture: NO GROWTH
Special Requests: ADEQUATE
Special Requests: ADEQUATE

## 2017-03-16 LAB — PHOSPHORUS: PHOSPHORUS: 5.2 mg/dL — AB (ref 2.5–4.6)

## 2017-03-16 MED ORDER — HYDROCORTISONE NA SUCCINATE PF 100 MG IJ SOLR
50.0000 mg | Freq: Three times a day (TID) | INTRAMUSCULAR | Status: DC
  Administered 2017-03-16 – 2017-03-21 (×16): 50 mg via INTRAVENOUS
  Filled 2017-03-16 (×16): qty 2

## 2017-03-16 MED ORDER — LEVOTHYROXINE SODIUM 100 MCG IV SOLR
100.0000 ug | Freq: Every day | INTRAVENOUS | Status: DC
  Administered 2017-03-17 – 2017-03-21 (×5): 100 ug via INTRAVENOUS
  Filled 2017-03-16 (×5): qty 5

## 2017-03-16 MED ORDER — MORPHINE SULFATE (PF) 2 MG/ML IV SOLN: 1.0000 mg | INTRAVENOUS | Status: DC | PRN

## 2017-03-16 NOTE — Plan of Care (Signed)
Problem: Fluid Volume: Goal: Ability to maintain a balanced intake and output will improve Outcome: Not Progressing Pt has 3+ pitting edema generalized, in active renal failure

## 2017-03-16 NOTE — Progress Notes (Signed)
[place] TEAM 1 - Stepdown/ICU TEAM  Glenda Dickson  ZOX:[id number] DOB: [birth year] DOA: 03/10/2017 PCP: Benita Stabile, MD    Brief Narrative:  83 y.o. female ALF resident with history of HTN, HLD, remote left hip fracture, DM, Depression, Systolic CHF (EF 81-19% 5/18), Chronic anemia (baseline appears to be 11-12), and Afib on Coumadin who presented to [facility] ED with leg pain after several falls, as well as 24hrs of confusion and lethargy.    [facility] ED eval noted a potential new L cerebellar CVA, and xrays noted a comminuted fx of the distal femor metaphysis on the L.  The pt was transferred to [facility] for a stroke evaluation.    Subjective: The patient is actually somewhat more alert today.  She will answer some simple questions.  She is however somewhat lethargic.  She does not appear to be in acute respiratory distress and there is no evidence of uncontrolled pain.  I have had an extensive discussion with the patient's 2 children today.  I have explained that she suffers with progressive renal failure, progressive liver failure, and refractory hypotension.  I have explained that I feel that we are currently doing all that we can to assist in her recovery but that she is not responding.  I have explained that I expect that she will not survive this hospitalization and that she likely has only a number of days before her probable death.  The patient's son and daughter both agree that NO CODE BLUE status is most appropriate.  They do however wish to continue present medical interventions and any other interventions which are thought to possibly be helpful as long as they don't contribute to her pain or anxiety.  Assessment & Plan:  Severe Hypothyroidism / ?Myxedema coma  TSH markedly elevated at 171 - Free T4 low at 0.35 - pt was initially relatively hypothermic and hypotensive - tx for myxedema w/ bothT4 and T3 (IV not on formulary) - empirically add hydrocortisone though it will not likely add much given  her appropriate cortisol level   Hyperbilirubinemia - Cirrhosis / NASH - acute hepatic failure (shock liver?) abdom US notes cirrhosis, likely NASH related - direct (conjugated) bili markedly increased - pt has cholelithiasis ("tiny stones") noted on abdom US but no evidence of ductal dilatation - suspect this is shock liver in the setting of a baseline of impaired liver fxn due to NASH cirrhosis - liver fxn is worsening - pt not likely to survive   Chronic systolic CHF - EF 25-30% via TTE May 2018 Now appears to be modestly volume overloaded, w/ peripheral edema - net +~12L since admit - attempt to avoid diuresis given hypotension   Filed Weights   03/14/17 0458 03/15/17 0400 03/16/17 0500  Weight: 98.1 kg (216 lb 4.3 oz) 104.8 kg (231 lb 1.6 oz) 105.3 kg (232 lb 2.3 oz)    AKI on CKD stage 4 Baseline crt 1.68 - felt to be ATN due to hypotension - has not responded to volume expansion - renal US w/o acute reversible findings - given advanced age an current severe acute comorbidities she would not be a candidate for HD therefore will not consult Renal at this time - cont pressor BP support    Recent Labs Lab 03/13/17 0204 03/13/17 1902 03/14/17 0245 03/15/17 1055 03/16/17 0331  CREATININE 3.45* 3.66* 3.85* 3.99* 4.03*   Hypotension ?progression of heart failure to end stage - ?myxedema - currently on dopa support, but this does not appear  to be improving hepatic or renal failure - adding empiric stress dose steroids today - outlook is grim   ?Cerebellar CVA - ruled out by MRI Stroke Team evaluated - MRI is NOT c/w CVA  DM2 CBG controlled  Chronic Atrial Fib CHA2DS2 - VASc is 8 - with worsening liver function have stopped her anticoagulation due to high risk of bleeding - with worsening liver function have stopped her amiodarone - digoxin a poor choice given renal failure - given hypotension calcium channel blocker and beta blocker are not appropriate - little else to offer should  significant RVR become an issue  Left distal femur fracture much too fragile to consider surgery at this time  Dementia  Family clarifies the patient had no cognitive deficits until early this year when they developed suddenly - follow clinically with treatment of myxedema  Chronic anemia  Most likely related to chronic kidney disease - if hemoglobin remains below 8 on follow-up check will consider transfusion  Obesity - Body mass index is 38.63 kg/m.   DVT prophylaxis: lovenox Code Status: NO CODE - continue active support short of code  Family Communication: Spoke with son and daughter at length in family conference room Disposition Plan: SDU  Consultants:  Orthopedics Neurology  Procedures: None  Antimicrobials:  None   Objective: Blood pressure (!) 106/58, pulse (!) 118, temperature 97.8 F (36.6 C), temperature source Axillary, resp. rate 18, height 5\' 5"  (1.651 m), weight 105.3 kg (232 lb 2.3 oz), SpO2 93 %.  Intake/Output Summary (Last 24 hours) at 03/16/17 1213 Last data filed at 03/16/17 1000  Gross per 24 hour  Intake          1779.02 ml  Output              225 ml  Net          1554.02 ml   Filed Weights   03/14/17 0458 03/15/17 0400 03/16/17 0500  Weight: 98.1 kg (216 lb 4.3 oz) 104.8 kg (231 lb 1.6 oz) 105.3 kg (232 lb 2.3 oz)    Examination: General: Markedly jaundiced - Actually somewhat more alert Lungs: Fine crackles throughout all fields with no wheezing - poor air movement diffusely Cardiovascular: Irregularly irregular and tachycardic Abdomen: Mildly protuberant, soft, bowel sounds not appreciated, no rebound Extremities: 1+ bilateral lower extremity edema without significant change  CBC:  Recent Labs Lab 03/10/17 1509  03/12/17 0219 03/13/17 0204 03/14/17 0245 03/15/17 1055 03/16/17 0331  WBC 6.5  < > 7.9 8.3 8.9 8.8 7.5  NEUTROABS 4.7  --   --   --   --   --   --   HGB 8.3*  < > 8.7* 8.2* 8.1* 8.3* 7.9*  HCT 25.0*  < > 27.0*  25.5* 25.4* 25.5* 24.1*  MCV 105.9*  < > 108.0* 107.6* 109.5* 107.6* 104.8*  PLT 105*  < > 121* 131* 118* 120* 106*  < > = values in this interval not displayed. Basic Metabolic Panel:  Recent Labs Lab 03/13/17 0204 03/13/17 1902 03/14/17 0245 03/14/17 1206 03/15/17 1055 03/16/17 0331  NA 141 140 141  --  140 140  K 5.3* 4.8 5.2*  --  4.7 4.9  CL 105 106 108  --  108 108  CO2 25 24 21*  --  20* 23  GLUCOSE 153* 135* 118*  --  171* 157*  BUN 64* 65* 67*  --  67* 70*  CREATININE 3.45* 3.66* 3.85*  --  3.99* 4.03*  CALCIUM  8.0* 7.8* 7.9*  --  7.3* 7.1*  MG  --   --   --  2.0 1.8  --   PHOS  --   --   --  5.1* 5.2* 5.2*   GFR: Estimated Creatinine Clearance: 12.1 mL/min (A) (by C-G formula based on SCr of 4.03 mg/dL (H)).  Liver Function Tests:  Recent Labs Lab 03/11/17 1859 03/13/17 0204 03/14/17 0245 03/15/17 1055 03/16/17 0331  AST 56* 114* 222* 545* 798*  ALT 35 71* 120* 297* 459*  ALKPHOS 120 104 104 117 119  BILITOT 5.4* 6.8* 10.6* 15.8* 16.8*  PROT 5.5* 4.9* 4.9* 4.9* 4.9*  ALBUMIN 3.1* 2.8* 2.8* 2.8* 2.5*    Recent Labs Lab 03/11/17 1859  LIPASE 20   Coagulation Profile:  Recent Labs Lab 03/12/17 0219 03/13/17 0204 03/14/17 0245 03/15/17 1055 03/16/17 0331  INR 1.56 1.76 1.76 1.77 2.25    Cardiac Enzymes:  Recent Labs Lab 03/12/17 0219  TROPONINI 0.06*    HbA1C: Hgb A1c MFr Bld  Date/Time Value Ref Range Status  03/11/2017 06:45 PM 6.0 (H) 4.8 - 5.6 % Final    Comment:    (NOTE)         Pre-diabetes: 5.7 - 6.4         Diabetes: >6.4         Glycemic control for adults with diabetes: <7.0   09/29/2016 04:32 PM 5.8 (H) 4.8 - 5.6 % Final    Comment:    (NOTE)         Pre-diabetes: 5.7 - 6.4         Diabetes: >6.4         Glycemic control for adults with diabetes: <7.0     CBG:  Recent Labs Lab 03/14/17 2128 03/15/17 0803 03/15/17 1147 03/15/17 1757 03/16/17 0742  GLUCAP 114* 153* 160* 161* 135*    Recent Results  (from the past 240 hour(s))  Surgical pcr screen     Status: None   Collection Time: 03/10/17 11:18 PM  Result Value Ref Range Status   MRSA, PCR NEGATIVE NEGATIVE Final   Staphylococcus aureus NEGATIVE NEGATIVE Final    Comment:        The Xpert SA Assay (FDA approved for NASAL specimens in patients over 821 years of age), is one component of a comprehensive surveillance program.  Test performance has been validated by [facility] for patients greater than or equal to 81 year old. It is not intended to diagnose infection nor to guide or monitor treatment.   Culture, blood (Routine X 2) w Reflex to ID Panel     Status: None   Collection Time: 03/11/17  6:38 PM  Result Value Ref Range Status   Specimen Description BLOOD LEFT HAND  Final   Special Requests IN PEDIATRIC BOTTLE Blood Culture adequate volume  Final   Culture NO GROWTH 5 DAYS  Final   Report Status 03/16/2017 FINAL  Final  Culture, blood (Routine X 2) w Reflex to ID Panel     Status: None   Collection Time: 03/11/17  6:59 PM  Result Value Ref Range Status   Specimen Description BLOOD RIGHT HAND  Final   Special Requests IN PEDIATRIC BOTTLE Blood Culture adequate volume  Final   Culture NO GROWTH 5 DAYS  Final   Report Status 03/16/2017 FINAL  Final  Culture, Urine     Status: None   Collection Time: 03/12/17  9:41 AM  Result Value Ref Range Status  Specimen Description URINE, RANDOM  Final   Special Requests NONE  Final   Culture NO GROWTH  Final   Report Status 03/13/2017 FINAL  Final     Scheduled Meds: . chlorhexidine  15 mL Mouth Rinse BID  . insulin aspart  0-9 Units Subcutaneous TID WC  . levothyroxine  50 mcg Intravenous Daily  . liothyronine  5 mcg Oral Q8H  . mouth rinse  15 mL Mouth Rinse BID  . sodium chloride flush  10-40 mL Intracatheter Q12H     LOS: 6 days   Lonia Blood, MD [facility] Office  [phone] Pager - Text Page per Loretha Stapler as per below:  On-Call/Text Page:       [email or web address]      password [email or web address]  If 7PM-7AM, please contact night-coverage [email or web address] Password [facility] 03/16/2017, 12:13 PM

## 2017-03-17 LAB — COMPREHENSIVE METABOLIC PANEL
ALBUMIN: 2.4 g/dL — AB (ref 3.5–5.0)
ALT: 528 U/L — AB (ref 14–54)
AST: 876 U/L — AB (ref 15–41)
Alkaline Phosphatase: 144 U/L — ABNORMAL HIGH (ref 38–126)
Anion gap: 13 (ref 5–15)
BILIRUBIN TOTAL: 20.2 mg/dL — AB (ref 0.3–1.2)
BUN: 75 mg/dL — AB (ref 6–20)
CO2: 19 mmol/L — ABNORMAL LOW (ref 22–32)
CREATININE: 4.24 mg/dL — AB (ref 0.44–1.00)
Calcium: 7 mg/dL — ABNORMAL LOW (ref 8.9–10.3)
Chloride: 106 mmol/L (ref 101–111)
GFR calc Af Amer: 10 mL/min — ABNORMAL LOW (ref >60)
GFR, EST NON AFRICAN AMERICAN: 9 mL/min — AB (ref >60)
GLUCOSE: 198 mg/dL — AB (ref 65–99)
POTASSIUM: 5.4 mmol/L — AB (ref 3.5–5.1)
Sodium: 138 mmol/L (ref 135–145)
TOTAL PROTEIN: 5.1 g/dL — AB (ref 6.5–8.1)

## 2017-03-17 LAB — CBC
HEMATOCRIT: 26 % — AB (ref 36.0–46.0)
HEMOGLOBIN: 8.7 g/dL — AB (ref 12.0–15.0)
MCH: 34.1 pg — ABNORMAL HIGH (ref 26.0–34.0)
MCHC: 33.5 g/dL (ref 30.0–36.0)
MCV: 102 fL — AB (ref 78.0–100.0)
Platelets: 101 10*3/uL — ABNORMAL LOW (ref 150–400)
RBC: 2.55 MIL/uL — AB (ref 3.87–5.11)
RDW: 23.9 % — AB (ref 11.5–15.5)
WBC: 9.2 10*3/uL (ref 4.0–10.5)

## 2017-03-17 LAB — GLUCOSE, CAPILLARY
GLUCOSE-CAPILLARY: 203 mg/dL — AB (ref 65–99)
GLUCOSE-CAPILLARY: 189 mg/dL — AB (ref 65–99)
Glucose-Capillary: 177 mg/dL — ABNORMAL HIGH (ref 65–99)
Glucose-Capillary: 193 mg/dL — ABNORMAL HIGH (ref 65–99)

## 2017-03-17 LAB — AMMONIA: AMMONIA: 43 umol/L — AB (ref 9–35)

## 2017-03-17 MED ORDER — FUROSEMIDE 10 MG/ML IJ SOLN
60.0000 mg | Freq: Once | INTRAMUSCULAR | Status: AC
  Administered 2017-03-17: 60 mg via INTRAVENOUS
  Filled 2017-03-17: qty 6

## 2017-03-17 NOTE — Progress Notes (Signed)
CRITICAL VALUE ALERT  Critical Value:  Total Bilirubin 20.2  Date & Time Notied:  03/17/17 @0527   Provider Notified: Merdis DelayK. Schorr of [facility]  Orders Received/Actions taken: No orders received, will continue to monitor.

## 2017-03-17 NOTE — Progress Notes (Signed)
Clarified order to d/c dopamine gtt with MD, as family was concerned. Spoke with family about order and clarified earlier MD discussion, OK to d/c per family.

## 2017-03-17 NOTE — Progress Notes (Signed)
[place] TEAM 1 - Stepdown/ICU TEAM  Glenda Dickson  NWG:[id number] DOB: [birth year] DOA: 03/10/2017 PCP: Benita Stabile, MD    Brief Narrative:  83 y.o. female ALF resident with history of HTN, HLD, remote left hip fracture, DM, Depression, Systolic CHF (EF 57-84% 5/18), Chronic anemia (baseline appears to be 11-12), and Afib on Coumadin who presented to [facility] ED with leg pain after several falls, as well as 24hrs of confusion and lethargy.    [facility] ED eval noted a potential new L cerebellar CVA, and xrays noted a comminuted fx of the distal femor metaphysis on the L.  The pt was transferred to [facility] for a stroke evaluation.    Subjective: The patient is awake and interactive but confused.  She does not appear to be uncomfortable.  There is no evidence of significant respiratory distress.  Assessment & Plan:  Severe Hypothyroidism / ?Myxedema coma  TSH markedly elevated at 171 - Free T4 low at 0.35 - pt was initially relatively hypothermic and hypotensive - tx for myxedema w/ bothT4 and T3 (IV not on formulary) - empirically added hydrocortisone without appreciable effect  Hyperbilirubinemia - Cirrhosis / NASH - acute hepatic failure (shock liver?) abdom US notes cirrhosis, likely NASH related - direct (conjugated) bili markedly increased - pt has cholelithiasis ("tiny stones") noted on abdom US but no evidence of ductal dilatation - suspect this is shock liver in the setting of a baseline of impaired liver fxn due to NASH cirrhosis - liver fxn is worsening - pt not likely to survive - discussed again w/ daughter in hallway   Chronic systolic CHF - EF 25-30% via TTE May 2018 Now appears to be modestly volume overloaded, w/ peripheral edema - net +~12.6L since admit - stop all IVF (including dopamine) - attempt to diurese but do not expect much response given AKI  Filed Weights   03/15/17 0400 03/16/17 0500 03/17/17 0500  Weight: 104.8 kg (231 lb 1.6 oz) 105.3 kg (232 lb 2.3 oz) 108.3 kg (238 lb  12.1 oz)    Acute renal failure on CKD stage 4 Baseline crt 1.68 - felt to be ATN due to hypotension - has not responded to volume expansion - renal US w/o acute reversible findings - given advanced age and current severe acute comorbidities she would not be a candidate for HD therefore will not consult Renal at this time - informed family that renal function continues to worsen     Recent Labs Lab 03/13/17 1902 03/14/17 0245 03/15/17 1055 03/16/17 0331 03/17/17 0428  CREATININE 3.66* 3.85* 3.99* 4.03* 4.24*   Hypotension ?progression of heart failure to end stage - ?myxedema - currently on dopa support, but this does not appear to be improving hepatic or renal failure so will stop - cont empiric stress dose steroids  ?Cerebellar CVA - ruled out by MRI Stroke Team evaluated - MRI is NOT c/w CVA  DM2 Follow w/o change today   Chronic Atrial Fib CHA2DS2 - VASc is 8 - with worsening liver function have stopped her anticoagulation due to high risk of bleeding - with worsening liver function have stopped her amiodarone - digoxin a poor choice given renal failure - given hypotension calcium channel blocker and beta blocker are not appropriate - little else to offer should significant RVR become an issue  Left distal femur fracture much too fragile to consider surgery at this time  Dementia  Family clarifies the patient had no cognitive deficits until early this year when  they developed suddenly - follow clinically with treatment of myxedema  Chronic anemia  Most likely related to chronic kidney disease - no indication for transfusion at this time   Obesity - Body mass index is 39.73 kg/m.   DVT prophylaxis: lovenox Code Status: NO CODE - continue active support short of code  Family Communication: Spoke with daughter in hallway - explained worsening condition and my fear that she will likely die within 2-3 days - to cont conservative med care as able  Disposition Plan:  SDU  Consultants:  Orthopedics Neurology  Procedures: None  Antimicrobials:  None   Objective: Blood pressure 115/73, pulse (!) 130, temperature 98.5 F (36.9 C), temperature source Oral, resp. rate 13, height 5\' 5"  (1.651 m), weight 108.3 kg (238 lb 12.1 oz), SpO2 94 %.  Intake/Output Summary (Last 24 hours) at 03/17/17 1337 Last data filed at 03/17/17 1240  Gross per 24 hour  Intake           723.21 ml  Output              125 ml  Net           598.21 ml   Filed Weights   03/15/17 0400 03/16/17 0500 03/17/17 0500  Weight: 104.8 kg (231 lb 1.6 oz) 105.3 kg (232 lb 2.3 oz) 108.3 kg (238 lb 12.1 oz)    Examination: General: Markedly jaundiced - conversant but confused  Lungs: poor air movement th/o - no wheezing  Cardiovascular: tachycardic and irregular  Abdomen: protuberant, soft, bowel sounds not appreciated, ascites  Extremities: 2+ bilateral lower extremity edema   CBC:  Recent Labs Lab 03/10/17 1509  03/13/17 0204 03/14/17 0245 03/15/17 1055 03/16/17 0331 03/17/17 0428  WBC 6.5  < > 8.3 8.9 8.8 7.5 9.2  NEUTROABS 4.7  --   --   --   --   --   --   HGB 8.3*  < > 8.2* 8.1* 8.3* 7.9* 8.7*  HCT 25.0*  < > 25.5* 25.4* 25.5* 24.1* 26.0*  MCV 105.9*  < > 107.6* 109.5* 107.6* 104.8* 102.0*  PLT 105*  < > 131* 118* 120* 106* 101*  < > = values in this interval not displayed. Basic Metabolic Panel:  Recent Labs Lab 03/13/17 1902 03/14/17 0245 03/14/17 1206 03/15/17 1055 03/16/17 0331 03/17/17 0428  NA 140 141  --  140 140 138  K 4.8 5.2*  --  4.7 4.9 5.4*  CL 106 108  --  108 108 106  CO2 24 21*  --  20* 23 19*  GLUCOSE 135* 118*  --  171* 157* 198*  BUN 65* 67*  --  67* 70* 75*  CREATININE 3.66* 3.85*  --  3.99* 4.03* 4.24*  CALCIUM 7.8* 7.9*  --  7.3* 7.1* 7.0*  MG  --   --  2.0 1.8  --   --   PHOS  --   --  5.1* 5.2* 5.2*  --    GFR: Estimated Creatinine Clearance: 11.7 mL/min (A) (by C-G formula based on SCr of 4.24 mg/dL (H)).  Liver  Function Tests:  Recent Labs Lab 03/13/17 0204 03/14/17 0245 03/15/17 1055 03/16/17 0331 03/17/17 0428  AST 114* 222* 545* 798* 876*  ALT 71* 120* 297* 459* 528*  ALKPHOS 104 104 117 119 144*  BILITOT 6.8* 10.6* 15.8* 16.8* 20.2*  PROT 4.9* 4.9* 4.9* 4.9* 5.1*  ALBUMIN 2.8* 2.8* 2.8* 2.5* 2.4*    Recent Labs Lab 03/11/17 1859  LIPASE 20   Coagulation Profile:  Recent Labs Lab 03/12/17 0219 03/13/17 0204 03/14/17 0245 03/15/17 1055 03/16/17 0331  INR 1.56 1.76 1.76 1.77 2.25    Cardiac Enzymes:  Recent Labs Lab 03/12/17 0219  TROPONINI 0.06*    HbA1C: Hgb A1c MFr Bld  Date/Time Value Ref Range Status  03/11/2017 06:45 PM 6.0 (H) 4.8 - 5.6 % Final    Comment:    (NOTE)         Pre-diabetes: 5.7 - 6.4         Diabetes: >6.4         Glycemic control for adults with diabetes: <7.0   09/29/2016 04:32 PM 5.8 (H) 4.8 - 5.6 % Final    Comment:    (NOTE)         Pre-diabetes: 5.7 - 6.4         Diabetes: >6.4         Glycemic control for adults with diabetes: <7.0     CBG:  Recent Labs Lab 03/16/17 1240 03/16/17 1657 03/16/17 2110 03/17/17 0718 03/17/17 1234  GLUCAP 140* 142* 157* 177* 203*    Recent Results (from the past 240 hour(s))  Surgical pcr screen     Status: None   Collection Time: 03/10/17 11:18 PM  Result Value Ref Range Status   MRSA, PCR NEGATIVE NEGATIVE Final   Staphylococcus aureus NEGATIVE NEGATIVE Final    Comment:        The Xpert SA Assay (FDA approved for NASAL specimens in patients over 81 years of age), is one component of a comprehensive surveillance program.  Test performance has been validated by [facility] for patients greater than or equal to 81 year old. It is not intended to diagnose infection nor to guide or monitor treatment.   Culture, blood (Routine X 2) w Reflex to ID Panel     Status: None   Collection Time: 03/11/17  6:38 PM  Result Value Ref Range Status   Specimen Description BLOOD LEFT HAND   Final   Special Requests IN PEDIATRIC BOTTLE Blood Culture adequate volume  Final   Culture NO GROWTH 5 DAYS  Final   Report Status 03/16/2017 FINAL  Final  Culture, blood (Routine X 2) w Reflex to ID Panel     Status: None   Collection Time: 03/11/17  6:59 PM  Result Value Ref Range Status   Specimen Description BLOOD RIGHT HAND  Final   Special Requests IN PEDIATRIC BOTTLE Blood Culture adequate volume  Final   Culture NO GROWTH 5 DAYS  Final   Report Status 03/16/2017 FINAL  Final  Culture, Urine     Status: None   Collection Time: 03/12/17  9:41 AM  Result Value Ref Range Status   Specimen Description URINE, RANDOM  Final   Special Requests NONE  Final   Culture NO GROWTH  Final   Report Status 03/13/2017 FINAL  Final     Scheduled Meds: . chlorhexidine  15 mL Mouth Rinse BID  . hydrocortisone sod succinate (SOLU-CORTEF) inj  50 mg Intravenous Q8H  . insulin aspart  0-9 Units Subcutaneous TID WC  . levothyroxine  100 mcg Intravenous Daily  . liothyronine  5 mcg Oral Q8H  . mouth rinse  15 mL Mouth Rinse BID  . sodium chloride flush  10-40 mL Intracatheter Q12H     LOS: 7 days   Lonia BloodJeffrey T. Jaleesa Cervi, MD [facility] Office  [phone] Pager - Text Page per Loretha StaplerAmion as  per below:  On-Call/Text Page:      [email or web address]      password [email or web address]  If 7PM-7AM, please contact night-coverage [email or web address] Password [email or web address] 03/17/2017, 1:37 PM

## 2017-03-17 NOTE — Progress Notes (Signed)
SLP Cancellation Note  Patient Details Name: Glenda Dickson MRN: [id number] DOB: [birth year]   Cancelled treatment:       Reason Eval/Treat Not Completed: Medical issues which prohibited therapy. Given poor prognosis per MD note, will defer any work up for potential diet advancement at this time. Pt is on a modified diet to reduce her risk of aspiration while also allowing PO for comfort. Have provided education to family already regarding diet and aspiration precautions. Will sign off for now. Please reorder if new needs arise.    Glenda Dickson, Riley NearingBonnie Caroline 03/17/2017, 2:16 PM

## 2017-03-17 NOTE — Clinical Social Work Note (Signed)
CSW continues to follow for disposition needs. Per MD, poor prognosis.  Charlynn CourtSarah Nahsir Venezia, CSW [phone]

## 2017-03-17 NOTE — Progress Notes (Signed)
Paged MD regarding possible palliative care consult, as patient has poor prognosis. Awaiting reply. Will continue to monitor.

## 2017-03-18 ENCOUNTER — Inpatient Hospital Stay (HOSPITAL_COMMUNITY): Payer: Medicare Other

## 2017-03-18 DIAGNOSIS — K7469 Other cirrhosis of liver: Secondary | ICD-10-CM

## 2017-03-18 DIAGNOSIS — E038 Other specified hypothyroidism: Secondary | ICD-10-CM

## 2017-03-18 DIAGNOSIS — T148XXA Other injury of unspecified body region, initial encounter: Secondary | ICD-10-CM

## 2017-03-18 LAB — COMPREHENSIVE METABOLIC PANEL
ALT: 373 U/L — ABNORMAL HIGH (ref 14–54)
AST: 537 U/L — ABNORMAL HIGH (ref 15–41)
Albumin: 2 g/dL — ABNORMAL LOW (ref 3.5–5.0)
Alkaline Phosphatase: 120 U/L (ref 38–126)
Anion gap: 13 (ref 5–15)
BUN: 82 mg/dL — ABNORMAL HIGH (ref 6–20)
CHLORIDE: 107 mmol/L (ref 101–111)
CO2: 17 mmol/L — ABNORMAL LOW (ref 22–32)
CREATININE: 4.81 mg/dL — AB (ref 0.44–1.00)
Calcium: 6.6 mg/dL — ABNORMAL LOW (ref 8.9–10.3)
GFR, EST AFRICAN AMERICAN: 9 mL/min — AB (ref >60)
GFR, EST NON AFRICAN AMERICAN: 7 mL/min — AB (ref >60)
Glucose, Bld: 175 mg/dL — ABNORMAL HIGH (ref 65–99)
POTASSIUM: 5.1 mmol/L (ref 3.5–5.1)
Sodium: 137 mmol/L (ref 135–145)
Total Bilirubin: 16.5 mg/dL — ABNORMAL HIGH (ref 0.3–1.2)
Total Protein: 4 g/dL — ABNORMAL LOW (ref 6.5–8.1)

## 2017-03-18 LAB — GLUCOSE, CAPILLARY
GLUCOSE-CAPILLARY: 165 mg/dL — AB (ref 65–99)
GLUCOSE-CAPILLARY: 165 mg/dL — AB (ref 65–99)
Glucose-Capillary: 152 mg/dL — ABNORMAL HIGH (ref 65–99)

## 2017-03-18 MED ORDER — MORPHINE SULFATE (PF) 4 MG/ML IV SOLN: 1.0000 mg | INTRAVENOUS | Status: DC | PRN

## 2017-03-18 NOTE — Progress Notes (Signed)
PROGRESS NOTE    Glenda Dickson  ZOX:[id number] DOB: 09-02-1928 DOA: 03/10/2017 PCP: Benita Stabile, MD   Brief Narrative:  83 y.o.WF ALF resident PMHx HTN, HLD, remote left hip fracture, DM type 2, Dementia, Depression, Chronic Systolic CHF (EF 81-19% 5/18), Afib on Coumadin , Chronic anemia (baseline appears to be 11-12),   Who presented to [facility] ED with leg pain after several falls, as well as 24hrs of confusion and lethargy.   [facility] ED eval noted a new L cerebellar CVA, and xrays noted a comminuted fx of the distal femor metaphysis on the L.  The pt was transferred to [facility] for a stroke evaluation.     Subjective: 6/26  A/O 1 (does not know where, when, why), negative CP, negative abdominal pain. States comfortable    Assessment & Plan:   Principal Problem:   Cerebellar infarct (HCC) Active Problems:   Hypertension   Diabetes mellitus type II, non insulin dependent (HCC)   Hyperlipidemia   Atrial fibrillation (HCC)   Anemia   Congestive heart failure, unspecified   Other closed fracture of lower end of femur   Hyperkalemia   Acute renal failure superimposed on stage 4 chronic kidney disease (HCC)   Hyperbilirubinemia   Dementia   Pain in joint, shoulder region   Hypothyroidism   Acute respiratory failure with hypoxia (HCC)   Jaundice   Elevated lactic acid level   Hypotension, unspecified   Metabolic encephalopathy   Unspecified cerebral artery occlusion with cerebral infarction   Acute kidney failure, unspecified (HCC)   Chronic combined systolic and diastolic heart failure (HCC)   Acidosis   Anticoagulant long-term use   Closed fracture of left femur (HCC)   Severe Hypothyroidism / ?Myxedema coma  -TSH severely elevated   Recent Labs Lab 03/13/17 0204  TSH 169.681*  - Free T4 is low at 0.35 - pt is relatively hypothermic and hypotensive  - Continue treatment myxedema; DO NOT escalate care   - utilize T4 and T3 until sx greatly improved then transition to  T4 only  - cortisol is normal therefore hydrocortisone not indicated   Dementia  -Family clarifies the patient had no cognitive deficits until early this year when they developed suddenly -follow clinically with treatment of myxedema  Cerebellar CVA?  -ruled out by MRI  Chronic systolic CHF  - EF 25-30% via TTE May 2018 -Strict I&O since admission + 12.6 L -Daily weight  Filed Weights   03/16/17 0500 03/17/17 0500 03/18/17 0300  Weight: 232 lb 2.3 oz (105.3 kg) 238 lb 12.1 oz (108.3 kg) 239 lb 3.2 oz (108.5 kg)  -Transfuse for hemoglobin<8 -Patient with fluid overload secondary to increasing hepatic failure --> decreased albumin. All fluids had been DC'd. Patient would not tolerate diuresis secondary to hypotension.  Chronic Atrial Fib with RVR (CHA2DS2 - VASc is 8)  - resume anticoagulation (discussed with family)  -Amiodarone drip: Discontinued in order to limit fluids -Hold all nodal blocking agents secondary to hypotension   Hypotension -Multifactorial to include systolic CHF, A. fib with RVR Myxedema  -Normal saline 38ml/hr + Dopamine:Discontinued in order to limit fluids  Acute renal failure on CKD stage 4(Baseline Cr 1.68)  - likely pre-renal azotemia due to intermittent hypotension  - follow w/ tx of myxedema and volume expansion  - avoid ACEi/ARB Lab Results  Component Value Date   CREATININE 4.81 (H) 03/18/2017   CREATININE 4.24 (H) 03/17/2017   CREATININE 4.03 (H) 03/16/2017  -Renal artery ultrasound; No treatable  findings therefore nephrology not consult.  Hyperbilirubinemia/Acute Liver Failure with hepatic coma/Liver cirrhosis  -?Cirrhosis vs Demand ischemia? vs Myxedema  -Worsening liver enzymes.  -Abdominal ultrasound consistent with liver cirrhosis see results below.   DM type 2 controlled with Renal complications -6/19 Hemoglobin A1c= 6.0  Left distal femur fracture -Care as per Orthopedics - no plans for the OR at present  Chronic anemia due to  CKD stage IV  -Most likely related to chronic kidney disease Recent Labs Lab 03/12/17 0219 03/13/17 0204 03/14/17 0245 03/15/17 1055 03/16/17 0331 03/17/17 0428  HGB 8.7* 8.2* 8.1* 8.3* 7.9* 8.7*   Goals of care -Spoke at length with son and [name] daughter concerning goals of care, and CODE STATUS. They would like to speak with palliative care. Have agreed for transfer to floor understanding patient will not survive this hospitalization.     DVT prophylaxis: SCD Code Status: DO NOT RESUSCITATE Family Communication: Son and Granddaughter and multiple family members present Disposition Plan: Palliative care consulted    Consultants:  Orthopedics Neurology    Procedures/Significant Events:  6/18 CT head W Wo contrast:-Recent appearing infarct in the inferior right cerebellar hemisphere. Suspect infarct involving a portion of the right posterior inferior cerebral artery distribution.  - underlying atrophy with mild periventricular small vessel disease.  6/18 DG hip unilateral or W/O pelvis:-Frontal pelvis as well as frontal and lateral left hip images or obtained. -Postoperative change in the proximal left femur.-Tip of screw is in the proximal femoral head.  6/18 DG femur:-Comminuted fracture distal femoral metaphysis with posterior displacement of distal major fracture fragment with respect to proximal major fragment 6/20 MRI/MRA brain:-Possible small remote lacunar infarct noted within the right thalamus -No large or proximal arterial branch occlusion within the intracranial circulation. No obvious high-grade or correctable stenosis. 6/20 RUQ abdominal ultrasound:Cholelithiasis without evidence of acute cholecystitis. - Fatty liver with findings suggestive of cirrhosis. -Small ascites. 6/21 Renal ultrasound:Nonobstructive right renal calculus. No hydronephrosis or renal obstruction      VENTILATOR SETTINGS:    Cultures 6/18 MRSA by PCR negative 6/19 blood NGTD 6/20  urine negative final    Antimicrobials: Anti-infectives    Start     Stop   03/12/17 0045  cefTRIAXone (ROCEPHIN) 1 g in dextrose 5 % 50 mL IVPB  Status:  Discontinued     03/12/17 1620       Devices   LINES / TUBES:      Continuous Infusions:    Objective: Vitals:   03/17/17 1807 03/17/17 1900 03/18/17 0300 03/18/17 0716  BP: (!) 71/40   (!) 107/52  Pulse: (!) 141   90  Resp:  15  16  Temp: 98.8 F (37.1 C)   98.2 F (36.8 C)  TempSrc: Oral   Oral  SpO2: 98%   93%  Weight:   239 lb 3.2 oz (108.5 kg)   Height:        Intake/Output Summary (Last 24 hours) at 03/18/17 0806 Last data filed at 03/18/17 0400  Gross per 24 hour  Intake              100 ml  Output               50 ml  Net               50 ml   Filed Weights   03/16/17 0500 03/17/17 0500 03/18/17 0300  Weight: 232 lb 2.3 oz (105.3 kg) 238 lb 12.1 oz (108.3 kg) 239  lb 3.2 oz (108.5 kg)    Examination:  General: A/O 1 (does not know where, why No acute respiratory distress Eyes: negative scleral hemorrhage, negative anisocoria, positive icterus ENT: Negative Runny nose, negative gingival bleeding, poor dentation Neck:  Negative scars, masses, torticollis, lymphadenopathy, JVD Lungs: Clear to auscultation bilaterally without wheezes or crackles Cardiovascular: Irregular irregular rhythm and rate without murmur gallop or rub normal S1 and S2 Abdomen: Morbidly obese, negative abdominal pain, nondistended, positive soft, bowel sounds, no rebound, no ascites, no appreciable mass Extremities: positive bilateral Upper/lower extremity edema 2-3+,  left leg in a soft immobilizer,  Skin: Jaundice  Psychiatric:  Unable to evaluate secondary to patient's altered mental status  Central nervous system:  Spontaneously moves all extremities, withdraws to painful stimuli   .     Data Reviewed: Care during the described time interval was provided by me .  I have reviewed this patient's available data,  including medical history, events of note, physical examination, and all test results as part of my evaluation. I have personally reviewed and interpreted all radiology studies.  CBC:  Recent Labs Lab 03/13/17 0204 03/14/17 0245 03/15/17 1055 03/16/17 0331 03/17/17 0428  WBC 8.3 8.9 8.8 7.5 9.2  HGB 8.2* 8.1* 8.3* 7.9* 8.7*  HCT 25.5* 25.4* 25.5* 24.1* 26.0*  MCV 107.6* 109.5* 107.6* 104.8* 102.0*  PLT 131* 118* 120* 106* 101*   Basic Metabolic Panel:  Recent Labs Lab 03/14/17 0245 03/14/17 1206 03/15/17 1055 03/16/17 0331 03/17/17 0428 03/18/17 0526  NA 141  --  140 140 138 137  K 5.2*  --  4.7 4.9 5.4* 5.1  CL 108  --  108 108 106 107  CO2 21*  --  20* 23 19* 17*  GLUCOSE 118*  --  171* 157* 198* 175*  BUN 67*  --  67* 70* 75* 82*  CREATININE 3.85*  --  3.99* 4.03* 4.24* 4.81*  CALCIUM 7.9*  --  7.3* 7.1* 7.0* 6.6*  MG  --  2.0 1.8  --   --   --   PHOS  --  5.1* 5.2* 5.2*  --   --    GFR: Estimated Creatinine Clearance: 10.3 mL/min (A) (by C-G formula based on SCr of 4.81 mg/dL (H)). Liver Function Tests:  Recent Labs Lab 03/14/17 0245 03/15/17 1055 03/16/17 0331 03/17/17 0428 03/18/17 0526  AST 222* 545* 798* 876* 537*  ALT 120* 297* 459* 528* 373*  ALKPHOS 104 117 119 144* 120  BILITOT 10.6* 15.8* 16.8* 20.2* 16.5*  PROT 4.9* 4.9* 4.9* 5.1* 4.0*  ALBUMIN 2.8* 2.8* 2.5* 2.4* 2.0*    Recent Labs Lab 03/11/17 1859  LIPASE 20    Recent Labs Lab 03/13/17 0204 03/13/17 1903 03/17/17 0428  AMMONIA 30 25 43*   Coagulation Profile:  Recent Labs Lab 03/12/17 0219 03/13/17 0204 03/14/17 0245 03/15/17 1055 03/16/17 0331  INR 1.56 1.76 1.76 1.77 2.25   Cardiac Enzymes:  Recent Labs Lab 03/12/17 0219  TROPONINI 0.06*   BNP (last 3 results) No results for input(s): PROBNP in the last 8760 hours. HbA1C: No results for input(s): HGBA1C in the last 72 hours. CBG:  Recent Labs Lab 03/16/17 2110 03/17/17 0718 03/17/17 1234  03/17/17 1734 03/17/17 2118  GLUCAP 157* 177* 203* 193* 189*   Lipid Profile: No results for input(s): CHOL, HDL, LDLCALC, TRIG, CHOLHDL, LDLDIRECT in the last 72 hours. Thyroid Function Tests: No results for input(s): TSH, T4TOTAL, FREET4, T3FREE, THYROIDAB in the last 72 hours. Anemia Panel:  No results for input(s): VITAMINB12, FOLATE, FERRITIN, TIBC, IRON, RETICCTPCT in the last 72 hours. Urine analysis:    Component Value Date/Time   COLORURINE AMBER (A) 03/12/2017 0941   APPEARANCEUR HAZY (A) 03/12/2017 0941   LABSPEC 1.014 03/12/2017 0941   PHURINE 5.0 03/12/2017 0941   GLUCOSEU NEGATIVE 03/12/2017 0941   HGBUR NEGATIVE 03/12/2017 0941   BILIRUBINUR NEGATIVE 03/12/2017 0941   KETONESUR NEGATIVE 03/12/2017 0941   PROTEINUR 30 (A) 03/12/2017 0941   NITRITE NEGATIVE 03/12/2017 0941   LEUKOCYTESUR MODERATE (A) 03/12/2017 0941   Sepsis Labs: @LABRCNTIP (procalcitonin:4,lacticidven:4)  ) Recent Results (from the past 240 hour(s))  Surgical pcr screen     Status: None   Collection Time: 03/10/17 11:18 PM  Result Value Ref Range Status   MRSA, PCR NEGATIVE NEGATIVE Final   Staphylococcus aureus NEGATIVE NEGATIVE Final    Comment:        The Xpert SA Assay (FDA approved for NASAL specimens in patients over 921 years of age), is one component of a comprehensive surveillance program.  Test performance has been validated by [facility] for patients greater than or equal to 81 year old. It is not intended to diagnose infection nor to guide or monitor treatment.   Culture, blood (Routine X 2) w Reflex to ID Panel     Status: None   Collection Time: 03/11/17  6:38 PM  Result Value Ref Range Status   Specimen Description BLOOD LEFT HAND  Final   Special Requests IN PEDIATRIC BOTTLE Blood Culture adequate volume  Final   Culture NO GROWTH 5 DAYS  Final   Report Status 03/16/2017 FINAL  Final  Culture, blood (Routine X 2) w Reflex to ID Panel     Status: None   Collection  Time: 03/11/17  6:59 PM  Result Value Ref Range Status   Specimen Description BLOOD RIGHT HAND  Final   Special Requests IN PEDIATRIC BOTTLE Blood Culture adequate volume  Final   Culture NO GROWTH 5 DAYS  Final   Report Status 03/16/2017 FINAL  Final  Culture, Urine     Status: None   Collection Time: 03/12/17  9:41 AM  Result Value Ref Range Status   Specimen Description URINE, RANDOM  Final   Special Requests NONE  Final   Culture NO GROWTH  Final   Report Status 03/13/2017 FINAL  Final         Radiology Studies: No results found.      Scheduled Meds: . chlorhexidine  15 mL Mouth Rinse BID  . hydrocortisone sod succinate (SOLU-CORTEF) inj  50 mg Intravenous Q8H  . insulin aspart  0-9 Units Subcutaneous TID WC  . levothyroxine  100 mcg Intravenous Daily  . liothyronine  5 mcg Oral Q8H  . mouth rinse  15 mL Mouth Rinse BID  . sodium chloride flush  10-40 mL Intracatheter Q12H   Continuous Infusions:    LOS: 8 days    Time spent: 40 minutes    WOODS, Roselind MessierURTIS J, MD [facility] Pager [phone]   If 7PM-7AM, please contact night-coverage [email or web address] Password [email or web address] 03/18/2017, 8:06 AM

## 2017-03-18 NOTE — Care Management Note (Signed)
Case Management Note  Patient Details  Name: Jeani SowSarah T Forde MRN: [id number] Date of Birth: 09/25/1927  Subjective/Objective:     Left femur fx and CVA               Action/Plan: Discharge Planning: Please see previous NCM notes. Pt consulted to Palliative Care. Anticipate hospital death.     Expected Discharge Date:                 Expected Discharge Plan:  Skilled Nursing Facility  In-House Referral:  Clinical Social Work  Discharge planning Services  CM Consult  Post Acute Care Choice:  NA Choice offered to:  NA  DME Arranged:  N/A DME Agency:  NA  HH Arranged:  NA HH Agency:     Status of Service:  In process, will continue to follow  If discussed at Long Length of Stay Meetings, dates discussed:    Additional Comments:  Elliot CousinShavis, Ajane Novella Ellen, RN 03/18/2017, 4:26 PM

## 2017-03-18 NOTE — Care Management Important Message (Signed)
Important Message  Patient Details  Name: Glenda Dickson MRN: [id number] Date of Birth: [birth year]   Medicare Important Message Given:  Yes    Kyla BalzarineShealy, Kinnedy Mongiello Abena 03/18/2017, 9:47 AM

## 2017-03-18 NOTE — Plan of Care (Signed)
Problem: Education: Goal: Knowledge of [facility] Education information/materials will improve Outcome: Progressing Family at bedside, Patient not doing well, emotional support offered and accepted. Patient resting comfortably

## 2017-03-19 DIAGNOSIS — Z7189 Other specified counseling: Secondary | ICD-10-CM

## 2017-03-19 DIAGNOSIS — Z515 Encounter for palliative care: Secondary | ICD-10-CM

## 2017-03-19 LAB — GLUCOSE, CAPILLARY
GLUCOSE-CAPILLARY: 136 mg/dL — AB (ref 65–99)
GLUCOSE-CAPILLARY: 140 mg/dL — AB (ref 65–99)
GLUCOSE-CAPILLARY: 132 mg/dL — AB (ref 65–99)
Glucose-Capillary: 147 mg/dL — ABNORMAL HIGH (ref 65–99)
Glucose-Capillary: 159 mg/dL — ABNORMAL HIGH (ref 65–99)

## 2017-03-19 NOTE — Progress Notes (Signed)
Nutrition Brief Note  Chart reviewed. Pt now transitioning to comfort care.  No further nutrition interventions warranted at this time.  Please re-consult as needed.   Austyn Seier A. Anahis Furgeson, RD, LDN, CDE Pager: [phone] After hours Pager: [phone]  

## 2017-03-19 NOTE — Consult Note (Signed)
Consultation Note Date: 03/19/2017   Patient Name: Glenda Dickson  DOB: May 06, 1928  MRN: [id number]  Age / Sex: 83 y.o., female  PCP: Celene Squibb, MD Referring Physician: Patrecia Pour, Christean Grief, MD  Reason for Consultation: Establishing goals of care  HPI/Patient Profile: 83 y.o. female  with past medical history of hypertension, hyperlipidemia, DM, CHF, cardiomyopathy with EF 25-30%, depression, dementia, afib, anemia, and hip fracture admitted on 03/10/2017 with leg pain s/p fall, confusion, and lethargy. Sent from [place] ED with new left cerebellar CVA and xray revealing left distal femur metaphysis fracture. Transferred to [facility] hospital. MRI negative. Found to have severely elevated TSH, hypotension, and hypothermia. Diagnosed with myxedema coma. Labs also revealed acute kidney failure on chronic kidney disease and hepatic failure. Renal u/s negative. Abdominal u/s reveals cirrhosis. Underlying heart failure and fluid overload. Hospitalist's have spoke with family who agreed with DNR/DNI with focus on comfort. Poor prognosis. Palliative medicine consultation for goals of care.   Clinical Assessment and Goals of Care: I have reviewed medical records and discussed with care team. Upon arrival to room, patient awake, alert, and pleasantly confused. Multiple family members at bedside.  Met with Dr. Quincy Simmonds, daughter Glenda Dickson), son (Glenda Dickson), and granddaughter (Glenda Dickson) in conference room.  Introduced Palliative Medicine as specialized medical care for people living with serious illness. It focuses on providing relief from the symptoms and stress of a serious illness.   We discussed baseline prior to hospitalization including functional and nutritional status. Glenda Dickson has lived in a nursing facility since February 2018 where she has required 24 hour care and assist with ADL's. Family speaks of heart failure and afib for many years.  Her PCP had diagnosed her with dementia due to forgetfulness, confusion, and agitation. Many medications (including aricept) were recently discontinued due to elevated LFT's. Family believes she started showing improvement in cognition once medications were discontinued. They were also monitoring her thyroid prior to hospitalization.    Dr. Quincy Simmonds and I discussed in detail diagnoses, interventions, and poor prognosis with multiorgan failure, myxedema coma, and underlying heart failure contributing to decline. Family has a good understanding of prognosis and medical interventions (such as dialysis) being more harm than good to her at the end of her life.   Family is most confused by the fact that she is "mentally better than she has been in months" and having full conversations with them. Educated on surge of energy that can often happen at the end of life. "three days though!" She is eating bites and requesting water.   Explained kidney functions and LFT's have worsened and she will likely decline as organs continue to fail. Not a candidate for dialysis and unable to diurese due to hypotension and elevated kidney functions.   Advanced directives, concepts specific to code status, and artifical feeding and hydration were discussed. Glenda Dickson does not have a documented HCPOA or living will. "she never wanted to talk about that" when children encouraged her to do so. They have confirmed DNR/DNI which they discussed  with hospitalist on admission.   Knowing prognosis is poor, family emphasizes "quality" for her at the EOL. They do not want her to suffer. They plan to have meaningful conversations with her while she is still awake and alert.   Discussed hospice services on discharge. Family is concerned that she "is too fragile" to move from the hospital. We have decided to monitor her for 24-48 hours. If she declines, hospital death anticipated. If she remains as is today, family will consider transition back  to her nursing facility with hospice services.   Multiple questions and concerns were addressed.  Hard Choices booklet left for review. Therapeutic listening and emotional support provided.    SUMMARY OF RECOMMENDATIONS    DNR/DNI  Focus is comfort. Continue current interventions but no escalation of care if she declines.   Will continue to monitor her in the next 24-48 hours. If stable, family considering hospice at [facility] on discharge.   If she declines, the goal is to allow comfort and dignity at the end-of-life.   PMT will continue to support patient/family daily through hospitalization.  Code Status/Advance Care Planning:  DNR  Symptom Management:   Per attending  Palliative Prophylaxis:   Aspiration, Delirium Protocol, Frequent Pain Assessment, Oral Care and Turn Reposition  Psycho-social/Spiritual:   Desire for further Chaplaincy support: yes  Additional Recommendations: Caregiving  Support/Resources and Education on Hospice  Prognosis:   Unable to determine: poor prognosis likely days with multiorgan failure, underlying CHF with 25-30%, and myxedema coma  Discharge Planning: To Be Determined may be appropriate for discharge with hospice services if she does not decline in the next 24-48 hours.      Primary Diagnoses: Present on Admission: . Other closed fracture of lower end of femur . Anemia . Atrial fibrillation (HCC) . Congestive heart failure, unspecified . Hyperlipidemia . Hypertension . Hyperkalemia . Acute renal failure superimposed on stage 4 chronic kidney disease (HCC) . Hyperbilirubinemia . Cerebellar infarct (HCC) . Dementia . Pain in joint, shoulder region . Hypothyroidism   I have reviewed the medical record, interviewed the patient and family, and examined the patient. The following aspects are pertinent.  Past Medical History:  Diagnosis Date  . Anemia, normocytic normochromic 06/26/2012   H&H-11.2/33.7, normal MCV in 06/2012   .  Atrial fibrillation (HCC)    on anticoagulation  . CHF (congestive heart failure) (HCC)   . Dementia 10/2016   Aricept did not help, mood stabilizer did  . Depression   . Diabetes mellitus type II    no insulin  . Fracture of left hip (HCC) 2006   intertrochanteric; ORIF - repaired by [facility]  . Hyperlipidemia   . Hypertension    Social History   Social History  . Marital status: Widowed    Spouse name: N/A  . Number of children: N/A  . Years of education: N/A   Occupational History  . retired    Social History Main Topics  . Smoking status: Never Smoker  . Smokeless tobacco: Never Used  . Alcohol use No  . Drug use: No  . Sexual activity: Not Currently   Other Topics Concern  . None   Social History Narrative  . None   Family History  Problem Relation Age of Onset  . Hypertension Mother   . Heart failure Mother   . Heart attack Father   . Hypertension Father   . Hypertension Sister        Atrial Fib  . Hypertension Brother  4/6 brothers with htn   Scheduled Meds: . chlorhexidine  15 mL Mouth Rinse BID  . hydrocortisone sod succinate (SOLU-CORTEF) inj  50 mg Intravenous Q8H  . insulin aspart  0-9 Units Subcutaneous TID WC  . levothyroxine  100 mcg Intravenous Daily  . liothyronine  5 mcg Oral Q8H  . mouth rinse  15 mL Mouth Rinse BID  . sodium chloride flush  10-40 mL Intracatheter Q12H   Continuous Infusions: PRN Meds:.bisacodyl, morphine injection, ondansetron (ZOFRAN) IV, polyethylene glycol, RESOURCE THICKENUP CLEAR, sodium chloride flush Medications Prior to Admission:  Prior to Admission medications   Medication Sig Start Date End Date Taking? Authorizing Provider  acetaminophen (TYLENOL) 325 MG tablet Take 650 mg by mouth every 6 (six) hours as needed for mild pain or moderate pain.    Yes [provider]  atorvastatin (LIPITOR) 40 MG tablet Take 40 mg by mouth every evening.    Yes [provider]  Calcium  Citrate-Vitamin D 315-250 MG-UNIT TABS Take 1 tablet by mouth daily.   Yes [provider]  ferrous sulfate 325 (65 FE) MG tablet Take 325 mg by mouth daily with breakfast.  06/29/16  Yes [provider]  furosemide (LASIX) 20 MG tablet Take 20 mg by mouth daily.   Yes [provider]  glipiZIDE (GLUCOTROL XL) 2.5 MG 24 hr tablet Take 2.5 mg by mouth daily with breakfast.    Yes [provider]  metoprolol tartrate (LOPRESSOR) 50 MG tablet Take 1 tablet by mouth 2 (two) times daily. 01/13/17  Yes [provider]  Multiple Vitamin (MULTIVITAMIN) tablet Take 1 tablet by mouth daily.   Yes [provider]  nystatin (MYCOSTATIN/NYSTOP) powder Apply topically 3 (three) times daily as needed.   Yes [provider]  PARoxetine (PAXIL) 20 MG tablet Take 20 mg by mouth daily. 01/13/17  Yes [provider]  potassium chloride (KLOR-CON) 20 MEQ packet Take 20 mEq by mouth daily.   Yes [provider]  QUEtiapine (SEROQUEL) 50 MG tablet Take 1 tablet by mouth 2 (two) times daily. 02/12/17  Yes [provider]  ramipril (ALTACE) 10 MG capsule Take 1 capsule (10 mg total) by mouth daily. 01/16/17  Yes Herminio Commons, MD  traZODone (DESYREL) 150 MG tablet Take 150 mg by mouth at bedtime as needed for sleep.   Yes [provider]  warfarin (COUMADIN) 2.5 MG tablet Take 2.5 mg by mouth daily at 6 PM. Take 2 tablet tonight (6/11) then 1.5 tablet daily except on Sunday, Tuesday and Thursday, take 1 tablet   Yes [provider]   Allergies  Allergen Reactions  . Benzalkonium Chloride-Alcohol Other (See Comments)    Patient/family is not familiar with this allergy  . Codeine Itching and Nausea Only   Review of Systems  Unable to perform ROS: Acuity of condition   Physical Exam  Constitutional: She is cooperative. She appears ill.  HENT:  Head: Normocephalic and atraumatic.  Pulmonary/Chest: Effort  normal. No accessory muscle usage. No tachypnea. No respiratory distress.  Neurological: She is alert.  Pleasantly confused  Skin: Skin is warm and dry.  jaundice  Psychiatric: Her speech is delayed. She is inattentive.  Nursing note and vitals reviewed.  Vital Signs: BP (!) 69/46 (BP Location: Left Wrist)   Pulse (!) 135   Temp 97.4 F (36.3 C) (Oral)   Resp 19   Ht '5\' 5"'$  (1.651 m)   Wt 111.2 kg (245 lb 2.4 oz)  SpO2 100%   BMI 40.80 kg/m  Pain Assessment: PAINAD POSS *See Group Information*: S-Acceptable,Sleep, easy to arouse Pain Score: Asleep  SpO2: SpO2: 100 % O2 Device:SpO2: 100 % O2 Flow Rate: .O2 Flow Rate (L/min): 4 L/min  IO: Intake/output summary:   Intake/Output Summary (Last 24 hours) at 03/19/17 1643 Last data filed at 03/19/17 1505  Gross per 24 hour  Intake              150 ml  Output                0 ml  Net              150 ml    LBM: Last BM Date: 03/18/17 Baseline Weight: Weight: 88 kg (194 lb) Most recent weight: Weight: 111.2 kg (245 lb 2.4 oz)     Palliative Assessment/Data: PPS 30%   Flowsheet Rows     Most Recent Value  Intake Tab  Referral Department  Hospitalist  Unit at Time of Referral  Med/Surg Unit  Palliative Care Primary Diagnosis  Other (Comment)  Palliative Care Type  New Palliative care  Reason for referral  Clarify Goals of Care  Date first seen by Palliative Care  03/19/17  Clinical Assessment  Palliative Performance Scale Score  30%  Psychosocial & Spiritual Assessment  Palliative Care Outcomes  Patient/Family meeting held?  Yes  Who was at the meeting?  daughter, son, granddaughter  Palliative Care Outcomes  Clarified goals of care, Counseled regarding hospice, ACP counseling assistance, Provided end of life care assistance, Provided psychosocial or spiritual support      Time In: 1530  Time Out: 1645 Time Total: 54mn Greater than 50%  of this time was spent counseling and coordinating care related to the above  assessment and plan.  Signed by:  MIhor Dow FNP-C Palliative Medicine Team  Phone: [phone]: [phone]  Please contact Palliative Medicine Team phone at [phone] questions and concerns.  For individual provider: See AShea Evans

## 2017-03-19 NOTE — Progress Notes (Signed)
PROGRESS NOTE [facility]   Glenda Dickson   ZOX:[id number] DOB: [birth year]  DOA: 03/10/2017 PCP: Benita Stabile, MD   Brief Narrative:  83 y.o.femaleALF resident with history of HTN, HLD, remote left hip fracture, DM, Depression, Systolic CHF (EF 81-19% 5/18), Chronic anemia (baseline appears to be 11-12), and Afib on Coumadin who presented to [facility] ED with leg pain after several falls, as well as 24hrs of confusion and lethargy.   [facility] ED eval noted a potential new L cerebellar CVA, and xrays noted a comminuted fx of the distal femor metaphysis on the L.  The pt was transferred to [facility] for a stroke evaluation.    Subjective: Patient seen and examined, more awake and responsive. Denies chest pain, SOB and palpitations.   Assessment & Plan:   Principal Problem:   Cerebellar infarct (HCC) Active Problems:   Hypertension   Diabetes mellitus type II, non insulin dependent (HCC)   Hyperlipidemia   Atrial fibrillation (HCC)   Anemia   Congestive heart failure, unspecified   Other closed fracture of lower end of femur   Hyperkalemia   Acute renal failure superimposed on stage 4 chronic kidney disease (HCC)   Hyperbilirubinemia   Dementia   Pain in joint, shoulder region   Hypothyroidism   Acute respiratory failure with hypoxia (HCC)   Jaundice   Elevated lactic acid level   Hypotension, unspecified   Metabolic encephalopathy   Unspecified cerebral artery occlusion with cerebral infarction   Acute kidney failure, unspecified (HCC)   Chronic combined systolic and diastolic heart failure (HCC)   Acidosis   Anticoagulant long-term use   Closed fracture of left femur (HCC)   Severe Hypothyroidism / ?Myxedema coma  TSH severely elevated 169.981 Free T4 is low at 0.35 Patient continues to be hypotensive, although clinically showed some improvement  Continue Thyroid replacement treatment with T4 and T3 for now. If symptoms continue to improve can de-escalate to only T4  treatment   Dementia  Family report that symptoms started this early this year, which was attributed to delirium and sundowning, the subsequently was found to be on severly hypothyroid  Will continue to follow up clinically   ? Cerebellar CVA Ruled out by MRI  Chronic systolic CHF  EF 14-78% via TTE May 2018 Strict I&O, since admission + >12 L  Daily weight, since admission + > 10 lb  Patient with fluid overload secondary to increasing hepatic failure decreased albumin. Fluid have be d/ced. Unable to diurese patient given hypotension. Triakl of Lasix were given, but Cr worsen. Will continue to monitor for now. Patient my benefit from palliative care.  Chronic Atrial Fib with RVR (CHA2DS2 - VASc is 8) Not on any a/c at this point,   Amiodarone drip: Discontinued in order to limit fluids Hold all nodal blocking agents secondary to hypotension  Hypotension Multifactorial to include systolic CHF, A. fib with RVR Myxedema  IVF were d/ced due to severe volume overload   Acute renal failure on CKD stage 4(Baseline Cr 1.68)  likely pre-renal azotemia due to intermittent hypotension Cardiorenal syndrome, difficult situation as patient can't received diuretic as she is hypotensive, unable to control hypotension due to fluid overload. Check CMP in AM   Hyperbilirubinemia/Acute Liver Failure with hepatic coma/Liver cirrhosis - likely NASH  Abdominal ultrasound consistent with liver cirrhosis see results below.  Monitor CMP in AM   DM type 2 controlled with Renal complications 6/19 Hemoglobin A1c= 6.0  Left distal femur fracture Per orthopedic team,  no plans for surgery at this time   Chronic anemia due to CKD stage IV  Most likely related to chronic kidney disease  Goals of care I have meet with daughter, son, granddaughter and palliative care team. We discussed in details condition and prognosis of the patient. Patient is a very difficult situation, as any potencial  treatment will likely cause more harm then benefits. Family verbalized understanding and they don't want aggressive treatment. Code status in confirm which is DNR. Patient at this time seems more interactive which it could be and episodic burst of energy, with that in mind patient's prognosis continues to be poor. I recommended hospice care for this patient    DVT prophylaxis: SCD Code Status: DNR  Family Communication: Son and daughter at bedside  Disposition Plan: Possible Hospice   Consultants:   Palliative   Orthopedics   Neurology   Procedures/Significant Events:  6/18 CT head W Wo contrast:-Recent appearing infarct in the inferior right cerebellar hemisphere. Suspect infarct involving a portion of the right posterior inferior cerebral artery distribution.  - underlying atrophy with mild periventricular small vessel disease.  6/18 DG hip unilateral or W/O pelvis:-Frontal pelvis as well as frontal and lateral left hip images or obtained. -Postoperative change in the proximal left femur.-Tip of screw is in the proximal femoral head.  6/18 DG femur:-Comminuted fracture distal femoral metaphysis with posterior displacement of distal major fracture fragment with respect to proximal major fragment 6/20 MRI/MRA brain:-Possible small remote lacunar infarct noted within the right thalamus -No large or proximal arterial branch occlusion within the intracranial circulation. No obvious high-grade or correctable stenosis. 6/20 RUQ abdominal ultrasound:Cholelithiasis without evidence of acute cholecystitis. - Fatty liver with findings suggestive of cirrhosis. -Small ascites. 6/21 Renal ultrasound:Nonobstructive right renal calculus. No hydronephrosis or renal obstruction   Antimicrobials: Anti-infectives    Start     Dose/Rate Route Frequency Ordered Stop   03/12/17 0045  cefTRIAXone (ROCEPHIN) 1 g in dextrose 5 % 50 mL IVPB  Status:  Discontinued     1 g 100 mL/hr over 30 Minutes  Intravenous Every 24 hours 03/12/17 0031 03/12/17 1620       Objective: Vitals:   03/18/17 2239 03/19/17 0500 03/19/17 0607 03/19/17 1340  BP: (!) 83/47  (!) 75/39 (!) 69/46  Pulse: (!) 130  (!) 131 (!) 135  Resp: 14  18 19   Temp: 98 F (36.7 C)  97.4 F (36.3 C) 97.4 F (36.3 C)  TempSrc: Oral  Oral Oral  SpO2: 97%  96% 100%  Weight:  111.2 kg (245 lb 2.4 oz)    Height:        Intake/Output Summary (Last 24 hours) at 03/19/17 1652 Last data filed at 03/19/17 1505  Gross per 24 hour  Intake              150 ml  Output                0 ml  Net              150 ml   Filed Weights   03/17/17 0500 03/18/17 0300 03/19/17 0500  Weight: 108.3 kg (238 lb 12.1 oz) 108.5 kg (239 lb 3.2 oz) 111.2 kg (245 lb 2.4 oz)    Examination:  General exam: Severe anasarca   Respiratory system: Anterior auscultation decrease breath sounds. No wheezes,crackle or rhonchi Cardiovascular system: S1 & S2 heard, RRR. No JVD, murmurs, rubs or gallops Gastrointestinal system: Abdomen is nondistended, soft and  nontender. No organomegaly or masses felt. Normal bowel sounds heard. Central nervous system: Oriented to person and place only  Extremities: All extremity w edema 1-2+   Skin: frail, weeping  Psychiatry: Mood appropriate    Data Reviewed: I have personally reviewed following labs and imaging studies  CBC:  Recent Labs Lab 03/13/17 0204 03/14/17 0245 03/15/17 1055 03/16/17 0331 03/17/17 0428  WBC 8.3 8.9 8.8 7.5 9.2  HGB 8.2* 8.1* 8.3* 7.9* 8.7*  HCT 25.5* 25.4* 25.5* 24.1* 26.0*  MCV 107.6* 109.5* 107.6* 104.8* 102.0*  PLT 131* 118* 120* 106* 101*   Basic Metabolic Panel:  Recent Labs Lab 03/14/17 0245 03/14/17 1206 03/15/17 1055 03/16/17 0331 03/17/17 0428 03/18/17 0526  NA 141  --  140 140 138 137  K 5.2*  --  4.7 4.9 5.4* 5.1  CL 108  --  108 108 106 107  CO2 21*  --  20* 23 19* 17*  GLUCOSE 118*  --  171* 157* 198* 175*  BUN 67*  --  67* 70* 75* 82*    CREATININE 3.85*  --  3.99* 4.03* 4.24* 4.81*  CALCIUM 7.9*  --  7.3* 7.1* 7.0* 6.6*  MG  --  2.0 1.8  --   --   --   PHOS  --  5.1* 5.2* 5.2*  --   --    GFR: Estimated Creatinine Clearance: 10.4 mL/min (A) (by C-G formula based on SCr of 4.81 mg/dL (H)). Liver Function Tests:  Recent Labs Lab 03/14/17 0245 03/15/17 1055 03/16/17 0331 03/17/17 0428 03/18/17 0526  AST 222* 545* 798* 876* 537*  ALT 120* 297* 459* 528* 373*  ALKPHOS 104 117 119 144* 120  BILITOT 10.6* 15.8* 16.8* 20.2* 16.5*  PROT 4.9* 4.9* 4.9* 5.1* 4.0*  ALBUMIN 2.8* 2.8* 2.5* 2.4* 2.0*   No results for input(s): LIPASE, AMYLASE in the last 168 hours.  Recent Labs Lab 03/13/17 0204 03/13/17 1903 03/17/17 0428  AMMONIA 30 25 43*   Coagulation Profile:  Recent Labs Lab 03/13/17 0204 03/14/17 0245 03/15/17 1055 03/16/17 0331  INR 1.76 1.76 1.77 2.25   Cardiac Enzymes: No results for input(s): CKTOTAL, CKMB, CKMBINDEX, TROPONINI in the last 168 hours. BNP (last 3 results) No results for input(s): PROBNP in the last 8760 hours. HbA1C: No results for input(s): HGBA1C in the last 72 hours. CBG:  Recent Labs Lab 03/18/17 1113 03/18/17 1842 03/18/17 2236 03/19/17 0805 03/19/17 1152  GLUCAP 165* 152* 147* 136* 140*   Lipid Profile: No results for input(s): CHOL, HDL, LDLCALC, TRIG, CHOLHDL, LDLDIRECT in the last 72 hours. Thyroid Function Tests: No results for input(s): TSH, T4TOTAL, FREET4, T3FREE, THYROIDAB in the last 72 hours. Anemia Panel: No results for input(s): VITAMINB12, FOLATE, FERRITIN, TIBC, IRON, RETICCTPCT in the last 72 hours. Sepsis Labs: No results for input(s): PROCALCITON, LATICACIDVEN in the last 168 hours.  Recent Results (from the past 240 hour(s))  Surgical pcr screen     Status: None   Collection Time: 03/10/17 11:18 PM  Result Value Ref Range Status   MRSA, PCR NEGATIVE NEGATIVE Final   Staphylococcus aureus NEGATIVE NEGATIVE Final    Comment:        The  Xpert SA Assay (FDA approved for NASAL specimens in patients over 45 years of age), is one component of a comprehensive surveillance program.  Test performance has been validated by [facility] for patients greater than or equal to 75 year old. It is not intended to diagnose infection nor to  guide or monitor treatment.   Culture, blood (Routine X 2) w Reflex to ID Panel     Status: None   Collection Time: 03/11/17  6:38 PM  Result Value Ref Range Status   Specimen Description BLOOD LEFT HAND  Final   Special Requests IN PEDIATRIC BOTTLE Blood Culture adequate volume  Final   Culture NO GROWTH 5 DAYS  Final   Report Status 03/16/2017 FINAL  Final  Culture, blood (Routine X 2) w Reflex to ID Panel     Status: None   Collection Time: 03/11/17  6:59 PM  Result Value Ref Range Status   Specimen Description BLOOD RIGHT HAND  Final   Special Requests IN PEDIATRIC BOTTLE Blood Culture adequate volume  Final   Culture NO GROWTH 5 DAYS  Final   Report Status 03/16/2017 FINAL  Final  Culture, Urine     Status: None   Collection Time: 03/12/17  9:41 AM  Result Value Ref Range Status   Specimen Description URINE, RANDOM  Final   Special Requests NONE  Final   Culture NO GROWTH  Final   Report Status 03/13/2017 FINAL  Final      Radiology Studies: No results found.    Scheduled Meds: . chlorhexidine  15 mL Mouth Rinse BID  . hydrocortisone sod succinate (SOLU-CORTEF) inj  50 mg Intravenous Q8H  . insulin aspart  0-9 Units Subcutaneous TID WC  . levothyroxine  100 mcg Intravenous Daily  . liothyronine  5 mcg Oral Q8H  . mouth rinse  15 mL Mouth Rinse BID  . sodium chloride flush  10-40 mL Intracatheter Q12H   Continuous Infusions:   LOS: 9 days    Time spent: Total of 35 minutes spent with pt, greater than 50% of which was spent in discussion of  treatment, counseling and coordination of care    Latrelle Dodrill, MD Pager: Text Page via [email or web address]  [phone]  If  7PM-7AM, please contact night-coverage [email or web address] Password [facility] 03/19/2017, 4:52 PM

## 2017-03-20 LAB — BASIC METABOLIC PANEL
Anion gap: 18 — ABNORMAL HIGH (ref 5–15)
BUN: 99 mg/dL — AB (ref 6–20)
CHLORIDE: 103 mmol/L (ref 101–111)
CO2: 16 mmol/L — ABNORMAL LOW (ref 22–32)
CREATININE: 5.56 mg/dL — AB (ref 0.44–1.00)
Calcium: 7.6 mg/dL — ABNORMAL LOW (ref 8.9–10.3)
GFR calc Af Amer: 7 mL/min — ABNORMAL LOW (ref >60)
GFR calc non Af Amer: 6 mL/min — ABNORMAL LOW (ref >60)
GLUCOSE: 162 mg/dL — AB (ref 65–99)
Potassium: 6.2 mmol/L — ABNORMAL HIGH (ref 3.5–5.1)
SODIUM: 137 mmol/L (ref 135–145)

## 2017-03-20 LAB — CBC WITH DIFFERENTIAL/PLATELET
BASOS PCT: 0 %
Basophils Absolute: 0 10*3/uL (ref 0.0–0.1)
EOS ABS: 0 10*3/uL (ref 0.0–0.7)
EOS PCT: 0 %
HEMATOCRIT: 23 % — AB (ref 36.0–46.0)
HEMOGLOBIN: 8 g/dL — AB (ref 12.0–15.0)
LYMPHS PCT: 10 %
Lymphs Abs: 0.8 10*3/uL (ref 0.7–4.0)
MCH: 34.6 pg — AB (ref 26.0–34.0)
MCHC: 34.8 g/dL (ref 30.0–36.0)
MCV: 99.6 fL (ref 78.0–100.0)
MONOS PCT: 8 %
Monocytes Absolute: 0.6 10*3/uL (ref 0.1–1.0)
NEUTROS ABS: 6.7 10*3/uL (ref 1.7–7.7)
Neutrophils Relative %: 82 %
Platelets: 76 10*3/uL — ABNORMAL LOW (ref 150–400)
RBC: 2.31 MIL/uL — ABNORMAL LOW (ref 3.87–5.11)
RDW: 26.2 % — ABNORMAL HIGH (ref 11.5–15.5)
WBC: 8.1 10*3/uL (ref 4.0–10.5)

## 2017-03-20 LAB — GLUCOSE, CAPILLARY
GLUCOSE-CAPILLARY: 164 mg/dL — AB (ref 65–99)
Glucose-Capillary: 162 mg/dL — ABNORMAL HIGH (ref 65–99)
Glucose-Capillary: 157 mg/dL — ABNORMAL HIGH (ref 65–99)
Glucose-Capillary: 137 mg/dL — ABNORMAL HIGH (ref 65–99)

## 2017-03-20 LAB — AMMONIA: AMMONIA: 33 umol/L (ref 9–35)

## 2017-03-20 LAB — ALBUMIN: Albumin: 2.2 g/dL — ABNORMAL LOW (ref 3.5–5.0)

## 2017-03-20 MED ORDER — MORPHINE SULFATE (PF) 4 MG/ML IV SOLN
1.0000 mg | INTRAVENOUS | Status: DC | PRN
  Administered 2017-03-21: 2 mg via INTRAVENOUS
  Filled 2017-03-20: qty 1

## 2017-03-20 MED ORDER — LORAZEPAM 2 MG/ML IJ SOLN: 1.0000 mg | INTRAMUSCULAR | Status: DC | PRN

## 2017-03-20 MED ORDER — GLYCOPYRROLATE 0.2 MG/ML IJ SOLN: 0.2000 mg | INTRAMUSCULAR | Status: DC | PRN

## 2017-03-20 NOTE — Progress Notes (Signed)
Pt was lethargic from morning to this afternoon, opens eyes with verbal stimuli. Family at the bedside.  1800 Pt is more awake, was able to eat.

## 2017-03-20 NOTE — Progress Notes (Signed)
Daily Progress Note   Patient Name: Glenda SowSarah T Appelt       Date: 03/20/2017 DOB: 01-28-1928  Age: 83 y.o. MRN#: [id number] Attending Physician: Randel PiggSilva Zapata, Dorma RussellEdwin, MD Primary Care Physician: [name], [name], MD Admit Date: 03/10/2017  Reason for Consultation/Follow-up: Establishing goals of care  Subjective: Upon arrival to room, patient drowsy with decreased responsiveness. Will opens eyes to voice but falls quickly back to sleep. Denies pain or discomfort. Shallow respirations.   Daughter Aggie Cosier(Theresa) and granddaughter (Amy) at bedside. They tell me she was more agitated last night and moaning. She has not been as talkative this morning. We again discussed likely surge of energy the past few days and she now appears to be transitioning. Family confirms focus on comfort. Discussed labs and vitals. Educated on EOL expectations and medications as needed for symptom management to relieve suffering. Family agreeable.   Answered questions and concerns. Therapeutic listening and emotional support provided.   Length of Stay: 10  Current Medications: Scheduled Meds:  . chlorhexidine  15 mL Mouth Rinse BID  . hydrocortisone sod succinate (SOLU-CORTEF) inj  50 mg Intravenous Q8H  . insulin aspart  0-9 Units Subcutaneous TID WC  . levothyroxine  100 mcg Intravenous Daily  . liothyronine  5 mcg Oral Q8H  . mouth rinse  15 mL Mouth Rinse BID  . sodium chloride flush  10-40 mL Intracatheter Q12H    Continuous Infusions:   PRN Meds: bisacodyl, glycopyrrolate, LORazepam, morphine injection, ondansetron (ZOFRAN) IV, polyethylene glycol, RESOURCE THICKENUP CLEAR, sodium chloride flush  Physical Exam  Constitutional: She is easily aroused. She appears ill.  HENT:  Head: Normocephalic and atraumatic.    Cardiovascular: Exam reveals distant heart sounds.   Pulmonary/Chest: No accessory muscle usage. No tachypnea. No respiratory distress. She has decreased breath sounds.  Shallow respirations  Abdominal: Bowel sounds are decreased.  Musculoskeletal: She exhibits edema (anasarca).  Neurological: She is easily aroused.  Drowsy, decreased responsiveness. Opens eyes to voice.   Skin: Skin is warm.  jaundice  Psychiatric: She is inattentive.  Nursing note and vitals reviewed.          Vital Signs: BP (!) 84/58 (BP Location: Left Arm)   Pulse (!) 114   Temp 98.5 F (36.9 C) (Oral)   Resp 15   Ht 5\' 5"  (1.651 m)  Wt 111.2 kg (245 lb 2.4 oz)   SpO2 93%   BMI 40.80 kg/m  SpO2: SpO2: 93 % O2 Device: O2 Device: Nasal Cannula O2 Flow Rate: O2 Flow Rate (L/min): 4 L/min  Intake/output summary:   Intake/Output Summary (Last 24 hours) at 03/20/17 0934 Last data filed at 03/20/17 1610  Gross per 24 hour  Intake              140 ml  Output                0 ml  Net              140 ml   LBM: Last BM Date: 03/18/17 Baseline Weight: Weight: 88 kg (194 lb) Most recent weight: Weight: 111.2 kg (245 lb 2.4 oz)       Palliative Assessment/Data: PPS 20%   Flowsheet Rows     Most Recent Value  Intake Tab  Referral Department  Hospitalist  Unit at Time of Referral  Med/Surg Unit  Palliative Care Primary Diagnosis  Other (Comment)  Palliative Care Type  New Palliative care  Reason for referral  Clarify Goals of Care  Date first seen by Palliative Care  03/19/17  Clinical Assessment  Palliative Performance Scale Score  20%  Psychosocial & Spiritual Assessment  Palliative Care Outcomes  Patient/Family meeting held?  Yes  Who was at the meeting?  daughter, granddaughter  Palliative Care Outcomes  Clarified goals of care, Provided psychosocial or spiritual support, Provided end of life care assistance, Improved pain interventions, Improved non-pain symptom therapy      Patient Active  Problem List   Diagnosis Date Noted  . Palliative care by specialist   . Acute respiratory failure with hypoxia (HCC)   . Jaundice   . Elevated lactic acid level   . Hypotension, unspecified   . Myxedema coma (HCC)   . Unspecified cerebral artery occlusion with cerebral infarction   . Acute kidney failure, unspecified (HCC)   . Chronic systolic congestive heart failure (HCC)   . Acidosis   . Anticoagulant long-term use   . Closed fracture of left femur (HCC)   . Pain in joint, shoulder region 03/11/2017  . Hypothyroidism 03/11/2017  . Other closed fracture of lower end of femur 03/10/2017  . Hyperkalemia 03/10/2017  . Acute renal failure superimposed on stage 4 chronic kidney disease (HCC) 03/10/2017  . Hyperbilirubinemia 03/10/2017  . Cerebellar infarct (HCC) 03/10/2017  . Dementia 10/24/2016  . Pressure injury of skin 09/30/2016  . CAP (community acquired pneumonia) 09/30/2016  . Supratherapeutic INR 09/29/2016  . Congestive heart failure, unspecified 09/29/2016  . Protein calorie malnutrition (HCC) 09/29/2016  . Leg swelling 09/29/2016  . Goals of care, counseling/discussion 11/06/2015  . Chronic kidney disease, stage IV (severe) (HCC) 08/24/2012  . Cerebrovascular disease 07/20/2012  . Anemia 06/26/2012  . Hypertension   . Diabetes mellitus type II, non insulin dependent (HCC)   . Hyperlipidemia   . Atrial fibrillation [facility])     Palliative Care Assessment & Plan   Patient Profile: 83 y.o. female  with past medical history of hypertension, hyperlipidemia, DM, CHF, cardiomyopathy with EF 25-30%, depression, dementia, afib, anemia, and hip fracture admitted on 03/10/2017 with leg pain s/p fall, confusion, and lethargy. Sent from [facility] ED with new left cerebellar CVA and xray revealing left distal femur metaphysis fracture. Transferred to [facility] hospital. MRI negative. Found to have severely elevated TSH, hypotension, and hypothermia. Diagnosed with myxedema coma. Labs also revealed  acute kidney failure on chronic kidney disease and hepatic failure. Renal u/s negative. Abdominal u/s reveals cirrhosis. Underlying heart failure and fluid overload. Hospitalist's have spoke with family who agreed with DNR/DNI with focus on comfort. Poor prognosis. Palliative medicine consultation for goals of care.   Assessment: Severe hypothyroidism  Myxedema coma Chronic systolic CHF Afib RVR Hypotension Hyperbilirubinemia  Acute liver failure with hepatic coma Acute renal failure on chronic kidney disease Left distal femur fracture  Recommendations/Plan:  DNR/DNI  Focus is comfort. Continue current medications. No escalation of care.   Patient with decreased responsiveness and shallow respirations--appears to be transitioning to EOL.   Symptom management  Morphine 1-2mg  IV q1h prn pain/dyspnea/air hunger  Ativan 1mg  IV q4h prn anxiety  Robinul 0.2mg  IV q4h prn secretions  PMT will continue to support patient/family through hospitalization and EOL.   Code Status: DNR/DNI   Code Status Orders        Start     Ordered   03/16/17 1213  Do not attempt resuscitation (DNR)  Continuous    Question Answer Comment  In the event of cardiac or respiratory ARREST Do not call a "code blue"   In the event of cardiac or respiratory ARREST Do not perform Intubation, CPR, defibrillation or ACLS   In the event of cardiac or respiratory ARREST Use medication by any route, position, wound care, and other measures to relive pain and suffering. May use oxygen, suction and manual treatment of airway obstruction as needed for comfort.      03/16/17 1212    Code Status History    Date Active Date Inactive Code Status Order ID Comments User Context   03/10/2017 10:58 PM 03/16/2017 12:12 PM Full Code [id number]  Jonah Blue, MD Inpatient   09/29/2016  7:08 PM 10/02/2016  9:35 PM Full Code [id number]  Briscoe Deutscher, MD ED    Advance Directive Documentation     Most Recent Value  Type of  Advance Directive  Healthcare Power of Attorney  Pre-existing out of facility DNR order (yellow form or pink MOST form)  -  "MOST" Form in Place?  -       Prognosis:   Hours - Days  Discharge Planning:  To Be Determined possible hospital death  Care plan was discussed with patient, daughter, and granddaughter at bedside, Dr. Edward Jolly, RN  Thank you for allowing the Palliative Medicine Team to assist in the care of this patient.   Time In: 0900 Time Out: 0935 Total Time Prolonged Time Billed no      Greater than 50%  of this time was spent counseling and coordinating care related to the above assessment and plan.  Vennie Homans, FNP-C Palliative Medicine Team  Phone: [phone] Fax: [phone]  Please contact Palliative Medicine Team phone at [phone] for questions and concerns.

## 2017-03-20 NOTE — Progress Notes (Addendum)
PROGRESS NOTE [facility]   Glenda Dickson   WUJ:[id number] DOB: 15-Oct-1927  DOA: 03/10/2017 PCP: [name], [name], MD   Brief Narrative:  83 y.o.femaleALF resident with history of HTN, HLD, remote left hip fracture, DM, Depression, Systolic CHF (EF 95-62%25-30% 5/18), Chronic anemia (baseline appears to be 11-12), and Afib on Coumadin who presented to [facility] ED with leg pain after several falls, as well as 24hrs of confusion and lethargy.   [facility] ED eval noted a potential new L cerebellar CVA, and xrays noted a comminuted fx of the distal femor metaphysis on the L.  [facility].   Subjective:.  Patient seen and examined, more lethargic today, drowsy but respond to verbal stimuli. Family report, a complete turn around from yesterday. Family has decide to make patient full comfort care. Expect hospital demise or transfer to hospice facility.   Assessment & Plan: Severe Hypothyroidism / ?Myxedema coma  TSH severely elevated 169.981 Free T4 is low at 0.35 BP slight improved today Continue Thyroid replacement treatment with T4 and T3 for now. If symptoms continue to improve can de-escalate to only T4 treatment   Dementia  Family report that symptoms started this early this year, which was attributed to delirium and sundowning, the subsequently was found to be on severly hypothyroid  Will continue to follow up clinically   ? Cerebellar CVA Ruled out by MRI  Chronic systolic CHF  EF 13-08%25-30% via TTE May 2018 Strict I&O, since admission + >12 L  Daily weight, since admission + > 10 lb  Patient with fluid overload secondary to increasing hepatic failure decreased albumin. Fluid have be d/ced. Unable to diurese patient given hypotension. Trial of Lasix were given, but renal function worsen. Patient now full comfort.   Chronic Atrial Fib with RVR (CHA2DS2 - VASc is 8) - severe anasarca  Not on any a/c at this point, non recommended  Amiodarone drip:  Discontinued in order to limit fluids Hold all nodal blocking agents secondary to hypotension Unable to diurese. Not candidate for HD.   Hypotension Multifactorial to include systolic CHF, A. fib with RVR Myxedema  IVF were d/ced due to severe volume overload   Acute renal failure on CKD stage 4(Baseline Cr 1.68) Likely pre-renal azotemia due to intermittent hypotension Cardiorenal syndrome, difficult situation as patient can't received diuretic as she is hypotensive, unable to control hypotension due to fluid overload. Patient not candidate for hemodialysis   Hyperbilirubinemia/Acute Liver Failure with hepatic coma/Liver cirrhosis - likely NASH  Abdominal ultrasound consistent with liver cirrhosis see results below.    DM type 2 controlled with Renal complications 6/19 Hemoglobin A1c= 6.0  Left distal femur fracture Per orthopedic team, no plans for surgery at this time   Chronic anemia due to CKD stage IV  Most likely related to chronic kidney disease  Goals of care Discussed case with family again, patient continues to have multiorgan failure, she is now deteriorating and her prognosis is very poor. Family has decided to go with full comfort care treatment. Expect hospital demise. If vital signs stable in AM would consider to transfer to hospice facility.  DVT prophylaxis: SCD Code Status: DNR  Family Communication: Son and daughter at bedside  Disposition Plan: Hospital demise vs hospice facility in AM   Consultants:   Palliative   Orthopedics   Neurology   Procedures/Significant Events:  6/18 CT head W Wo contrast:-Recent appearing infarct in the inferior right cerebellar hemisphere. Suspect  infarct involving a portion of the right posterior inferior cerebral artery distribution.  - underlying atrophy with mild periventricular small vessel disease.  6/18 DG hip unilateral or W/O pelvis:-Frontal pelvis as well as frontal and lateral left hip images or obtained.  -Postoperative change in the proximal left femur.-Tip of screw is in the proximal femoral head.  6/18 DG femur:-Comminuted fracture distal femoral metaphysis with posterior displacement of distal major fracture fragment with respect to proximal major fragment 6/20 MRI/MRA brain:-Possible small remote lacunar infarct noted within the right thalamus -No large or proximal arterial branch occlusion within the intracranial circulation. No obvious high-grade or correctable stenosis. 6/20 RUQ abdominal ultrasound:Cholelithiasis without evidence of acute cholecystitis. - Fatty liver with findings suggestive of cirrhosis. -Small ascites. 6/21 Renal ultrasound:Nonobstructive right renal calculus. No hydronephrosis or renal obstruction   Antimicrobials: Anti-infectives    Start     Dose/Rate Route Frequency Ordered Stop   03/12/17 0045  cefTRIAXone (ROCEPHIN) 1 g in dextrose 5 % 50 mL IVPB  Status:  Discontinued     1 g 100 mL/hr over 30 Minutes Intravenous Every 24 hours 03/12/17 0031 03/12/17 1620      Objective: Vitals:   03/19/17 0607 03/19/17 1340 03/20/17 0617 03/20/17 1331  BP: (!) 75/39 (!) 69/46 (!) 84/58 (!) 110/58  Pulse: (!) 131 (!) 135 (!) 114 (!) 128  Resp: 18 19 15 16   Temp: 97.4 F (36.3 C) 97.4 F (36.3 C) 98.5 F (36.9 C) 97.4 F (36.3 C)  TempSrc: Oral Oral Oral Oral  SpO2: 96% 100% 93% 93%  Weight:      Height:        Intake/Output Summary (Last 24 hours) at 03/20/17 1635 Last data filed at 03/20/17 0626  Gross per 24 hour  Intake               50 ml  Output                0 ml  Net               50 ml   Filed Weights   03/17/17 0500 03/18/17 0300 03/19/17 0500  Weight: 108.3 kg (238 lb 12.1 oz) 108.5 kg (239 lb 3.2 oz) 111.2 kg (245 lb 2.4 oz)    Examination:  General: Drowsy but responsive to verbal stimuli. Severe anasarca, jaundice and icteric 1+ Cardiovascular: RRR, S1/S2 +, no rubs, no gallops Respiratory: Breath sounds diminished anteriorly, mild  crackles at the bases Abdominal: Hard due to edema, NT, ND, bowel sounds + Extremities: Diffuse edema in all extremities, no cyanosis Skin: Weeping   Data Reviewed: I have personally reviewed following labs and imaging studies  CBC:  Recent Labs Lab 03/14/17 0245 03/15/17 1055 03/16/17 0331 03/17/17 0428 03/20/17 0353  WBC 8.9 8.8 7.5 9.2 8.1  NEUTROABS  --   --   --   --  6.7  HGB 8.1* 8.3* 7.9* 8.7* 8.0*  HCT 25.4* 25.5* 24.1* 26.0* 23.0*  MCV 109.5* 107.6* 104.8* 102.0* 99.6  PLT 118* 120* 106* 101* 76*   Basic Metabolic Panel:  Recent Labs Lab 03/14/17 1206 03/15/17 1055 03/16/17 0331 03/17/17 0428 03/18/17 0526 03/20/17 0353  NA  --  140 140 138 137 137  K  --  4.7 4.9 5.4* 5.1 6.2*  CL  --  108 108 106 107 103  CO2  --  20* 23 19* 17* 16*  GLUCOSE  --  171* 157* 198* 175* 162*  BUN  --  67* 70* 75* 82* 99*  CREATININE  --  3.99* 4.03* 4.24* 4.81* 5.56*  CALCIUM  --  7.3* 7.1* 7.0* 6.6* 7.6*  MG 2.0 1.8  --   --   --   --   PHOS 5.1* 5.2* 5.2*  --   --   --    GFR: Estimated Creatinine Clearance: 9 mL/min (A) (by C-G formula based on SCr of 5.56 mg/dL (H)). Liver Function Tests:  Recent Labs Lab 03/14/17 0245 03/15/17 1055 03/16/17 0331 03/17/17 0428 03/18/17 0526 03/20/17 0353  AST 222* 545* 798* 876* 537*  --   ALT 120* 297* 459* 528* 373*  --   ALKPHOS 104 117 119 144* 120  --   BILITOT 10.6* 15.8* 16.8* 20.2* 16.5*  --   PROT 4.9* 4.9* 4.9* 5.1* 4.0*  --   ALBUMIN 2.8* 2.8* 2.5* 2.4* 2.0* 2.2*   No results for input(s): LIPASE, AMYLASE in the last 168 hours.  Recent Labs Lab 03/13/17 1903 03/17/17 0428 03/20/17 0353  AMMONIA 25 43* 33   Coagulation Profile:  Recent Labs Lab 03/14/17 0245 03/15/17 1055 03/16/17 0331  INR 1.76 1.77 2.25   CBG:  Recent Labs Lab 03/19/17 1152 03/19/17 1706 03/19/17 2216 03/20/17 0747 03/20/17 1214  GLUCAP 140* 132* 159* 164* 162*    Recent Results (from the past 240 hour(s))  Surgical  pcr screen     Status: None   Collection Time: 03/10/17 11:18 PM  Result Value Ref Range Status   MRSA, PCR NEGATIVE NEGATIVE Final   Staphylococcus aureus NEGATIVE NEGATIVE Final    Comment:        The Xpert SA Assay (FDA approved for NASAL specimens in patients over 73 years of age), is one component of a comprehensive surveillance program.  Test performance has been validated by [facility] for patients greater than or equal to 15 year old. It is not intended to diagnose infection nor to guide or monitor treatment.   Culture, blood (Routine X 2) w Reflex to ID Panel     Status: None   Collection Time: 03/11/17  6:38 PM  Result Value Ref Range Status   Specimen Description BLOOD LEFT HAND  Final   Special Requests IN PEDIATRIC BOTTLE Blood Culture adequate volume  Final   Culture NO GROWTH 5 DAYS  Final   Report Status 03/16/2017 FINAL  Final  Culture, blood (Routine X 2) w Reflex to ID Panel     Status: None   Collection Time: 03/11/17  6:59 PM  Result Value Ref Range Status   Specimen Description BLOOD RIGHT HAND  Final   Special Requests IN PEDIATRIC BOTTLE Blood Culture adequate volume  Final   Culture NO GROWTH 5 DAYS  Final   Report Status 03/16/2017 FINAL  Final  Culture, Urine     Status: None   Collection Time: 03/12/17  9:41 AM  Result Value Ref Range Status   Specimen Description URINE, RANDOM  Final   Special Requests NONE  Final   Culture NO GROWTH  Final   Report Status 03/13/2017 FINAL  Final     Radiology Studies: No results found.   Scheduled Meds: . chlorhexidine  15 mL Mouth Rinse BID  . hydrocortisone sod succinate (SOLU-CORTEF) inj  50 mg Intravenous Q8H  . insulin aspart  0-9 Units Subcutaneous TID WC  . levothyroxine  100 mcg Intravenous Daily  . liothyronine  5 mcg Oral Q8H  . mouth rinse  15 mL Mouth Rinse BID  .  sodium chloride flush  10-40 mL Intracatheter Q12H   Continuous Infusions:   LOS: 10 days    Time spent: Total of 35  minutes spent with pt, greater than 50% of which was spent in discussion of  treatment, counseling and coordination of care    Latrelle Dodrill, MD Pager: Text Page via [email or web address]  [phone]  If 7PM-7AM, please contact night-coverage [email or web address] Password [facility] 03/20/2017, 4:35 PM

## 2017-03-21 LAB — GLUCOSE, CAPILLARY
GLUCOSE-CAPILLARY: 164 mg/dL — AB (ref 65–99)
Glucose-Capillary: 148 mg/dL — ABNORMAL HIGH (ref 65–99)
Glucose-Capillary: 161 mg/dL — ABNORMAL HIGH (ref 65–99)

## 2017-03-21 MED ORDER — RESOURCE THICKENUP CLEAR PO POWD: ORAL | Status: AC

## 2017-03-21 MED ORDER — HEPARIN SOD (PORK) LOCK FLUSH 100 UNIT/ML IV SOLN
250.0000 [IU] | INTRAVENOUS | Status: AC | PRN
  Administered 2017-03-21: 250 [IU]

## 2017-03-21 MED ORDER — MORPHINE SULFATE (PF) 4 MG/ML IV SOLN: 1.0000 mg | INTRAVENOUS | 0 refills | Status: AC | PRN

## 2017-03-21 MED ORDER — GLYCOPYRROLATE 0.2 MG/ML IJ SOLN: 0.2000 mg | INTRAMUSCULAR | 0 refills | Status: AC | PRN

## 2017-03-21 MED ORDER — HYDROCORTISONE 20 MG PO TABS: 20.0000 mg | ORAL_TABLET | Freq: Two times a day (BID) | ORAL | Status: AC

## 2017-03-21 MED ORDER — LEVOTHYROXINE SODIUM 100 MCG PO TABS: 100.0000 ug | ORAL_TABLET | Freq: Every day | ORAL | 0 refills | Status: AC

## 2017-03-21 MED ORDER — LIOTHYRONINE SODIUM 5 MCG PO TABS: 5.0000 ug | ORAL_TABLET | Freq: Three times a day (TID) | ORAL | Status: AC

## 2017-03-21 MED ORDER — BISACODYL 10 MG RE SUPP: 10.0000 mg | Freq: Every day | RECTAL | 0 refills | Status: AC | PRN

## 2017-03-21 MED ORDER — LORAZEPAM 2 MG/ML IJ SOLN: 1.0000 mg | INTRAMUSCULAR | 0 refills | Status: AC | PRN

## 2017-03-21 MED ORDER — ONDANSETRON HCL 4 MG/5ML PO SOLN: 4.0000 mg | Freq: Once | ORAL | 0 refills | Status: AC

## 2017-03-21 NOTE — Clinical Social Work Note (Signed)
CSW facilitated patient discharge including contacting patient family and facility to confirm patient discharge plans. Clinical information faxed to facility and family agreeable with plan. CSW arranged ambulance transport via [facility]. RN to call report prior to discharge [phone]).  CSW will sign off for now as social work intervention is no longer needed. Please consult us again if new needs arise.  Charlynn CourtSarah Marty Sadlowski, CSW [phone]

## 2017-03-21 NOTE — Care Management Important Message (Signed)
Important Message  Patient Details  Name: Glenda SowSarah T Dickson MRN: [id number] Date of Birth: [birth year]   Medicare Important Message Given:  Yes    Glennon Macmerson, Shanyce Daris M, RN 03/21/2017, 4:53 PM

## 2017-03-21 NOTE — Discharge Summary (Addendum)
Physician Discharge Summary  Glenda SowSarah T Dickson  ZOX:[id number]  DOB: [birth year]  DOA: 03/10/2017 PCP: [name], [name], MD  Admit date: 03/10/2017 Discharge date: 03/21/2017  Admitted From: SNF  Disposition: Hospice Facility - [place]   Recommendations for Outpatient Follow-up:  Hospice Care/Comfort measures   Equipment/Devices: O2 2L [place] prn dyspnea  Discharge Condition: Comfort care  CODE STATUS: DNR  Diet recommendation: Dysphagia   Brief/Interim Summary: 83 y.o.femaleALF residentwith history of HTN, HLD, remote left hip fracture, DM, Depression, Systolic CHF (EF 81-19%25-30% 5/18), Chronic anemia (baseline appears to be 11-12), and Afib on Coumadin who presented to [facility] ED with leg pain after several falls, as well as 24hrs of confusion and lethargy.   [facility] ED eval noted a potential new L cerebellar CVA, and xrays noted a comminuted fx of the distal femor metaphysis on the L. [facility]   Subjective: Patient is seen and examined the day of discharge. Today a little bit more alert but still drowsy and confused. Vital signs stable.  Discharge Diagnoses/Hospital Course:  Severe Hypothyroidism / ?Myxedema coma  TSH severely elevated 169.981 Free T4 is low at 0.35 Remains hypotensive but stable in the 90s Recommend to continue Thyroid replacement treatment with T4 and T3 for now.   Dementia  Family report that symptoms started this early this year, which was attributed to delirium and sundowning, the subsequently was found to be on severly hypothyroid  Patient mental status waxing and waning.  Haldol when necessary  ? Cerebellar CVA Ruled out by MRI  Chronic systolic CHF  EF 14-78%25-30% via TTE May 2018 Strict I&O, since admission + >12 L  Daily weight, since admission + > 10 lb  Patient with fluid overload secondary to increasing hepatic failure decreased albumin. Fluid have be d/ced. Unable to diurese patient given hypotension. Trial of  Lasix were given, but renal function worsen. Patient was transitioned to comfort care   Chronic Atrial Fib with RVR (CHA2DS2 - VASc is 8) - severe anasarca  Not on any a/c at this point, non recommended  Amiodarone drip: Discontinued in order to limit fluids Hold all nodal blocking agents secondary to hypotension Unable to diurese. Not candidate for HD.   Hypotension Multifactorial to include systolic CHF, A. fib with RVR, Myxedema  IVF were d/ced due to severe volume overload.  BP has remained very low but stable during last 48 hrs.  Was treated with IV hydrocortisone - will discharge on hydrocortisone taper   Acute renal failure on CKD stage 4(Baseline Cr 1.68) Likely pre-renal azotemia due to intermittent hypotension Cardiorenal syndrome, difficult situation as patient can't received diuretic as she is hypotensive, unable to control hypotension due to fluid overload. sCr continues to worsen  Patient not candidate for hemodialysis   Hyperbilirubinemia/Acute Liver Failurewith hepatic coma/Liver cirrhosis - likely NASH  Abdominal ultrasound consistent with liver cirrhosis see results below.    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 Patient will be discharge to SNF with hospice services   Procedures/Significant Events: 6/18 CT head W Wo contrast:-Recent appearing infarct in the inferior right cerebellar hemisphere. Suspect infarct involving a portion of the right posterior inferior cerebral artery distribution.  -  underlying atrophy with mild periventricular small vessel disease.  6/18 DG hip unilateral or W/O pelvis:-Frontal pelvis as well as frontal and lateral left hip images  or obtained. -Postoperative change in the proximal left femur.-Tip of screw is in the proximal femoral head.  6/18 DG femur:-Comminuted fracture distal femoral metaphysis with posterior displacement of distal major fracture fragment with respect to proximal major fragment 6/20 MRI/MRA brain:-Possible small remote lacunar infarct noted within the right thalamus -No large or proximal arterial branch occlusion within the intracranial circulation. No obvious high-grade or correctable stenosis. 6/20 RUQ abdominal ultrasound:Cholelithiasis without evidence of acute cholecystitis. - Fatty liver with findings suggestive of cirrhosis. -Small ascites. 6/21 Renal ultrasound:Nonobstructive right renal calculus. No hydronephrosis or renal obstruction   Discharge Instructions  You were cared for by a hospitalist during your hospital stay. If you have any questions about your discharge medications or the care you received while you were in the hospital after you are discharged, you can call the unit and asked to speak with the hospitalist on call if the hospitalist that took care of you is not available. Once you are discharged, your primary care physician will handle any further medical issues. Please note that NO REFILLS for any discharge medications will be authorized once you are discharged, as it is imperative that you return to your primary care physician (or establish a relationship with a primary care physician if you do not have one) for your aftercare needs so that they can reassess your need for medications and monitor your lab values.  Discharge Instructions    For home use only DME oxygen    Complete by:  As directed    PRN for dyspnea   Mode or (Route):  Nasal cannula   Liters per Minute:  2   Oxygen delivery system:  Gas     Allergies as of 03/21/2017      Reactions   Benzalkonium Chloride-alcohol Other (See Comments)   Patient/family is not familiar with this allergy   Codeine Itching,  Nausea Only      Medication List    STOP taking these medications   acetaminophen 325 MG tablet Commonly known as:  TYLENOL   atorvastatin 40 MG tablet Commonly known as:  LIPITOR   Calcium Citrate-Vitamin D 315-250 MG-UNIT Tabs   ferrous sulfate 325 (65 FE) MG tablet   furosemide 20 MG tablet Commonly known as:  LASIX   glipiZIDE 2.5 MG 24 hr tablet Commonly known as:  GLUCOTROL XL   metoprolol tartrate 50 MG tablet Commonly known as:  LOPRESSOR   multivitamin tablet   nystatin powder Commonly known as:  MYCOSTATIN/NYSTOP   PARoxetine 20 MG tablet Commonly known as:  PAXIL   potassium chloride 20 MEQ packet Commonly known as:  KLOR-CON   QUEtiapine 50 MG tablet Commonly known as:  SEROQUEL   ramipril 10 MG capsule Commonly known as:  ALTACE   traZODone 150 MG tablet Commonly known as:  DESYREL   warfarin 2.5 MG tablet Commonly known as:  COUMADIN     TAKE these medications   bisacodyl 10 MG suppository Commonly known as:  DULCOLAX Place 1 suppository (10 mg total) rectally daily as needed for moderate constipation.   glycopyrrolate 0.2 MG/ML injection Commonly known as:  ROBINUL Inject 1 mL (0.2 mg total) into the vein every 4 (four) hours as needed (secretions).   hydrocortisone 20 MG tablet Commonly known as:  CORTEF Take 1 tablet (20 mg total) by mouth 2 (two) times  daily.   levothyroxine 100 MCG tablet Commonly known as:  SYNTHROID Take 1 tablet (100 mcg total) by mouth daily.   liothyronine 5 MCG tablet Commonly known as:  CYTOMEL Take 1 tablet (5 mcg total) by mouth every 8 (eight) hours.   LORazepam 2 MG/ML injection Commonly known as:  ATIVAN Inject 0.5 mLs (1 mg total) into the vein every 4 (four) hours as needed for anxiety.   morphine 4 MG/ML injection Inject 0.25-0.5 mLs (1-2 mg total) into the vein every hour as needed for severe pain (dyspnea/air hunger).   ondansetron 4 MG/5ML solution Commonly known as:  ZOFRAN Take 5 mLs  (4 mg total) by mouth once.   RESOURCE THICKENUP CLEAR Powd As needed            Durable Medical Equipment        Start     Ordered   03/21/17 0000  For home use only DME oxygen    Comments:  PRN for dyspnea  Question Answer Comment  Mode or (Route) Nasal cannula   Liters per Minute 2   Oxygen delivery system Gas      03/21/17 1332      Allergies  Allergen Reactions  . Benzalkonium Chloride-Alcohol Other (See Comments)    Patient/family is not familiar with this allergy  . Codeine Itching and Nausea Only    Consultations:  Palliative Care  Critical Care    Procedures/Studies: Dg Chest 1 View  Result Date: 03/10/2017 CLINICAL DATA:  83 year old with multiple recent falls, presenting with jaundice, slurred speech and shortness of breath. Current history of hypertension, diabetes, chronic diastolic CHF. EXAM: CHEST 1 VIEW COMPARISON:  01/14/2017, 10/23/2016 and earlier. FINDINGS: Cardiac silhouette moderately to markedly enlarged, unchanged. Pulmonary vascularity normal without evidence of pulmonary edema. Chronic dense consolidation in the left lower lobe and associated left pleural effusion, unchanged dating back to January, 2018. Lungs otherwise clear. No visible right pleural effusion. Degenerative changes again noted involving the right shoulder. IMPRESSION: Chronic left lower lobe atelectasis and/or pneumonia and left pleural effusion. Stable cardiomegaly. No new abnormalities since 04/24 1,018. Electronically Signed   By: Hulan Saas M.D.   On: 03/10/2017 15:08   Ct Head Wo Contrast  Result Date: 03/10/2017 CLINICAL DATA:  Several recent falls.  Slurred speech. EXAM: CT HEAD WITHOUT CONTRAST TECHNIQUE: Contiguous axial images were obtained from the base of the skull through the vertex without intravenous contrast. COMPARISON:  Head CT October 23, 2016 and brain MRI October 23, 2016 FINDINGS: Brain: There is moderate diffuse atrophy. There is a focal infarct in  the inferior right cerebellar hemisphere with cytotoxic edema in the inferior to mid right cerebellar hemisphere, not present on prior studies. There is no intracranial mass, hemorrhage, extra-axial fluid collection, or midline shift. There is mild small vessel disease in the centra semiovale bilaterally. No other recent/acute appearing infarct is appreciable on this study. Vascular: There is no appreciable hyperdense vessel. There is calcification in each carotid siphon as well as in each distal vertebral artery. Skull: The bony calvarium appears intact. Sinuses/Orbits: There is mucosal thickening in several ethmoid air cells bilaterally. There is diffuse opacification in the right sphenoid sinus. Other paranasal sinuses are clear. Orbits appear symmetric bilaterally. Other: Mastoid air cells are clear. IMPRESSION: Recent appearing infarct in the inferior right cerebellar hemisphere. Suspect infarct involving a portion of the right posterior inferior cerebral artery distribution. No other acute appearing infarct evident. There is underlying atrophy with mild periventricular small vessel disease.  There is no mass, hemorrhage, or extra-axial fluid collection. There are foci of arterial calcification in the anterior and posterior circulations. There is paranasal sinus disease, most marked in the right sphenoid sinus region. Electronically Signed   By: Bretta Bang III M.D.   On: 03/10/2017 14:08   Mr Maxine Glenn Head Wo Contrast  Result Date: 03/12/2017 CLINICAL DATA:  Initial evaluation for acute stroke.  Headache. EXAM: MRI HEAD WITHOUT CONTRAST MRA HEAD WITHOUT CONTRAST TECHNIQUE: Multiplanar, multiecho pulse sequences of the brain and surrounding structures were obtained without intravenous contrast. Angiographic images of the head were obtained using MRA technique without contrast. COMPARISON:  Prior CT from 03/10/2017. FINDINGS: MRI HEAD FINDINGS Brain: Study severely degraded by motion artifact. Diffuse  prominence of the CSF containing spaces compatible generalized cerebral atrophy. No significant cerebral white matter disease for age. No abnormal foci of restricted diffusion to suggest acute or subacute ischemia. Possible small remote lacunar infarct noted within the right thalamus. No other evidence for chronic infarction. No evidence for acute or chronic intracranial hemorrhage. No mass lesion, midline shift or mass effect. Mild ventricular prominence related global parenchymal volume loss of hydrocephalus. No extra-axial fluid collection. Pituitary gland and suprasellar region grossly within normal limits. Midline structures intact and normal. Vascular: Major intracranial vascular flow voids are maintained. Skull and upper cervical spine: Craniocervical junction within normal limits. Visualized upper cervical spine grossly unremarkable. Bone marrow signal intensity within normal limits. No scalp soft tissue abnormality. Sinuses/Orbits: Globes and orbital soft tissues grossly unremarkable. Chronic right sphenoid sinus disease. Paranasal sinuses otherwise clear. Trace bilateral mastoid effusions. Inner ear structures grossly normal. MRA HEAD FINDINGS ANTERIOR CIRCULATION: Study severely degraded by motion artifact. Distal cervical segments of the internal carotid artery is grossly patent with antegrade flow. Normal flow seen within the petrous, cavernous, and supraclinoid segments without obvious stenosis. ICA termini patent bilaterally. A1 segments patent. Anterior communicating artery not well evaluated. Anterior cerebral arteries grossly patent to their distal aspects. M1 segments patent without obvious stenosis, poorly evaluated on this motion degraded exam. No obvious proximal M2 occlusion. Distal MCA branches grossly patent, poorly evaluated on this motion degraded study. POSTERIOR CIRCULATION: Vertebral arteries patent to the vertebrobasilar junction without obvious stenosis. Posterior inferior cerebral  artery is not seen on this motion degraded exam. Basilar artery patent to its distal aspect without obvious stenosis. Superior cerebral arteries not well seen. Both of the posterior cerebral arteries primarily supplied via the basilar. PCAs are patent proximally, not well evaluated distally, particularly on the left. No obvious aneurysm or vascular malformation. IMPRESSION: MRI HEAD IMPRESSION: 1. Moderately motion degraded study. 2. No acute intracranial infarct or other process identified. 3. Generalized age-related cerebral atrophy. MRA HEAD IMPRESSION: 1. Severely limited study due to motion. 2. No large or proximal arterial branch occlusion within the intracranial circulation. No obvious high-grade or correctable stenosis. Further delineation of the intracranial vasculature fairly limited on this motion degraded exam. Electronically Signed   By: Rise Mu M.D.   On: 03/12/2017 00:15   Mr Brain Wo Contrast  Result Date: 03/12/2017 CLINICAL DATA:  Initial evaluation for acute stroke.  Headache. EXAM: MRI HEAD WITHOUT CONTRAST MRA HEAD WITHOUT CONTRAST TECHNIQUE: Multiplanar, multiecho pulse sequences of the brain and surrounding structures were obtained without intravenous contrast. Angiographic images of the head were obtained using MRA technique without contrast. COMPARISON:  Prior CT from 03/10/2017. FINDINGS: MRI HEAD FINDINGS Brain: Study severely degraded by motion artifact. Diffuse prominence of the CSF containing spaces compatible generalized cerebral  atrophy. No significant cerebral white matter disease for age. No abnormal foci of restricted diffusion to suggest acute or subacute ischemia. Possible small remote lacunar infarct noted within the right thalamus. No other evidence for chronic infarction. No evidence for acute or chronic intracranial hemorrhage. No mass lesion, midline shift or mass effect. Mild ventricular prominence related global parenchymal volume loss of hydrocephalus. No  extra-axial fluid collection. Pituitary gland and suprasellar region grossly within normal limits. Midline structures intact and normal. Vascular: Major intracranial vascular flow voids are maintained. Skull and upper cervical spine: Craniocervical junction within normal limits. Visualized upper cervical spine grossly unremarkable. Bone marrow signal intensity within normal limits. No scalp soft tissue abnormality. Sinuses/Orbits: Globes and orbital soft tissues grossly unremarkable. Chronic right sphenoid sinus disease. Paranasal sinuses otherwise clear. Trace bilateral mastoid effusions. Inner ear structures grossly normal. MRA HEAD FINDINGS ANTERIOR CIRCULATION: Study severely degraded by motion artifact. Distal cervical segments of the internal carotid artery is grossly patent with antegrade flow. Normal flow seen within the petrous, cavernous, and supraclinoid segments without obvious stenosis. ICA termini patent bilaterally. A1 segments patent. Anterior communicating artery not well evaluated. Anterior cerebral arteries grossly patent to their distal aspects. M1 segments patent without obvious stenosis, poorly evaluated on this motion degraded exam. No obvious proximal M2 occlusion. Distal MCA branches grossly patent, poorly evaluated on this motion degraded study. POSTERIOR CIRCULATION: Vertebral arteries patent to the vertebrobasilar junction without obvious stenosis. Posterior inferior cerebral artery is not seen on this motion degraded exam. Basilar artery patent to its distal aspect without obvious stenosis. Superior cerebral arteries not well seen. Both of the posterior cerebral arteries primarily supplied via the basilar. PCAs are patent proximally, not well evaluated distally, particularly on the left. No obvious aneurysm or vascular malformation. IMPRESSION: MRI HEAD IMPRESSION: 1. Moderately motion degraded study. 2. No acute intracranial infarct or other process identified. 3. Generalized  age-related cerebral atrophy. MRA HEAD IMPRESSION: 1. Severely limited study due to motion. 2. No large or proximal arterial branch occlusion within the intracranial circulation. No obvious high-grade or correctable stenosis. Further delineation of the intracranial vasculature fairly limited on this motion degraded exam. Electronically Signed   By: Rise Mu M.D.   On: 03/12/2017 00:15   US Renal  Result Date: 03/13/2017 CLINICAL DATA:  Acute renal failure. EXAM: RENAL / URINARY TRACT ULTRASOUND COMPLETE COMPARISON:  None. FINDINGS: Right Kidney: Length: 9 cm. 15 mm nonobstructive calculus is seen in upper pole. Echogenicity within normal limits. No mass or hydronephrosis visualized. Left Kidney: Length: 9.8 cm. 1.3 cm simple cyst is noted in midpole. Echogenicity within normal limits. No mass or hydronephrosis visualized. Bladder: Decompressed secondary to Foley catheter. Small right pleural effusion is noted as well as minimal ascites. IMPRESSION: Nonobstructive right renal calculus. No hydronephrosis or renal obstruction is noted. Small right pleural effusion and minimal ascites. Electronically Signed   By: Lupita Raider, M.D.   On: 03/13/2017 10:37   Dg Chest Port 1 View  Result Date: 03/14/2017 CLINICAL DATA:  PICC placement EXAM: PORTABLE CHEST 1 VIEW COMPARISON:  03/13/2017 FINDINGS: Right PICC has been placed. Tip projects in the lower superior vena cava just above the caval atrial junction, well positioned. There is persistent hazy opacity at the left lung base stable from the prior exam. Remainder of the lungs is clear. IMPRESSION: Right PICC line tip projects in the lower superior vena cava just above the caval atrial junction. Electronically Signed   By: Amie Portland M.D.   On:  03/14/2017 21:20   Dg Chest Port 1 View  Result Date: 03/13/2017 CLINICAL DATA:  Altered mental status, history atrial fibrillation, CHF, diabetes mellitus, hypertension EXAM: PORTABLE CHEST 1 VIEW  COMPARISON:  Portable exam 1142 hours compared 03/11/2017 FINDINGS: Enlargement of cardiac silhouette. Mediastinal contours and pulmonary vascularity normal. Atherosclerotic calcification aorta. Persistent LEFT basilar consolidation and pleural effusion. Remaining lungs clear. No pneumothorax. Bones demineralized with degenerative changes at RIGHT glenohumeral joint. IMPRESSION: Enlargement of cardiac silhouette. Persistent LEFT basilar consolidation and pleural effusion. Aortic Atherosclerosis (ICD10-I70.0). Electronically Signed   By: Ulyses Southward M.D.   On: 03/13/2017 12:02   Dg Chest Port 1 View  Result Date: 03/11/2017 CLINICAL DATA:  The acute respiratory failure with hypoxia, type II diabetes mellitus, hypertension, atrial fibrillation, CHF EXAM: PORTABLE CHEST 1 VIEW COMPARISON:  Portable exam 1809 hours compared 03/10/2017 FINDINGS: Enlargement of cardiac silhouette. Atherosclerotic calcification aorta. Mediastinal contours and pulmonary vascularity otherwise normal for technique. Persistent LEFT pleural effusion with atelectasis versus consolidation in LEFT lower lobe, little changed. Minimal RIGHT basilar atelectasis. Upper lungs clear. No pneumothorax. Bones demineralized with periarticular calcifications at the RIGHT glenohumeral joint region. IMPRESSION: Enlargement of cardiac silhouette. Persistent LEFT lower lobe atelectasis versus consolidation with associated LEFT pleural effusion. Aortic Atherosclerosis (ICD10-I70.0). Electronically Signed   By: Ulyses Southward M.D.   On: 03/11/2017 18:33   Dg Hip Unilat W Or Wo Pelvis 2-3 Views Left  Result Date: 03/10/2017 CLINICAL DATA:  Pain.  Multiple recent falls EXAM: DG HIP (WITH OR WITHOUT PELVIS) 2-3V LEFT COMPARISON:  None. FINDINGS: Frontal pelvis as well as frontal and lateral left hip images or obtained. There is postoperative change in the proximal left femur. Tip of screw is in the proximal femoral head. There is myositis ossificans dorsal to  the greater tuberosity. There is mild symmetric narrowing of both hip joints. There is no acute fracture or dislocation. There is extensive aortoiliac and bilateral common and superficial femoral artery atherosclerosis. IMPRESSION: No acute fracture or dislocation. Mild narrowing of both hip joints. Myositis ossifications superior to the left greater trochanter. Postoperative change on the left. Extensive aortoiliac and femoral artery atherosclerosis. Electronically Signed   By: Bretta Bang III M.D.   On: 03/10/2017 14:43   Dg Femur Min 2 Views Left  Result Date: 03/10/2017 CLINICAL DATA:  Pain following recent fall EXAM: LEFT FEMUR 2 VIEWS COMPARISON:  None. FINDINGS: Frontal and lateral views were obtained. There is a comminuted fracture of the distal femoral metaphysis with posterior displacement of distal major fracture fragment with respect to proximal major fracture fragment. More proximally, there is no acute fracture or dislocation. There is evidence of old trauma with postoperative fixation. There is myositis ossificans superior to the greater trochanter. No dislocation. There is a joint effusion. There is extensive vascular atherosclerosis. IMPRESSION: Comminuted fracture distal femoral metaphysis with posterior displacement of distal major fracture fragment with respect to proximal major fragment. Advanced arthropathy in the knee joint without frank dislocation. There is a moderate joint effusion. Postoperative change proximally without acute fracture or dislocation proximally. There is extensive femoral artery atherosclerosis. Electronically Signed   By: Bretta Bang III M.D.   On: 03/10/2017 14:45   US Abdomen Limited Ruq  Result Date: 03/12/2017 CLINICAL DATA:  83 year old female with jaundice. EXAM: ULTRASOUND ABDOMEN LIMITED RIGHT UPPER QUADRANT COMPARISON:  Abdominal CT dated 05/06/2006 FINDINGS: Gallbladder: Tiny gallbladder stones noted. The gallbladder wall is mildly thickened  measuring 5 mm, possibly related to ascites. Negative sonographic Murphy's sign. Common  bile duct: Diameter: 5 mm Liver: There is heterogeneous and coarsened liver echotexture likely related to underlying fatty infiltration or cirrhosis. There is a small subhepatic ascites. IMPRESSION: 1. Cholelithiasis without definite sonographic evidence of acute cholecystitis. A hepatobiliary scintigraphy may provide better evaluation of the gallbladder if there is a high clinical concern for acute cholecystitis . 2. Fatty liver with findings suggestive of cirrhosis. Correlation with clinical exam and liver function test recommended. MRI may provide better evaluation of the liver if clinically indicated. 3. Small ascites. Electronically Signed   By: Elgie Collard M.D.   On: 03/12/2017 00:38    Discharge Exam: Vitals:   03/21/17 0535 03/21/17 1432  BP: (!) 90/54 (!) 108/44  Pulse: (!) 133 (!) 122  Resp:  17  Temp: 97.5 F (36.4 C) 97.4 F (36.3 C)   Vitals:   03/20/17 0617 03/20/17 1331 03/21/17 0535 03/21/17 1432  BP: (!) 84/58 (!) 110/58 (!) 90/54 (!) 108/44  Pulse: (!) 114 (!) 128 (!) 133 (!) 122  Resp: 15 16  17   Temp: 98.5 F (36.9 C) 97.4 F (36.3 C) 97.5 F (36.4 C) 97.4 F (36.3 C)  TempSrc: Oral Oral Oral Oral  SpO2: 93% 93% 98% 90%  Weight:   110.1 kg (242 lb 12.8 oz)   Height:        General: Drowsy, confused but responsive  Cardiovascular: Distant S1S2 RRR  Respiratory: Breath sounds diminished throughout. Diffuse crackles, no wheezing  Abdominal: Obese , edematous, non tender non distended  Extremities: Anasarca.  The results of significant diagnostics from this hospitalization (including imaging, microbiology, ancillary and laboratory) are listed below for reference.     Microbiology: Recent Results (from the past 240 hour(s))  Culture, blood (Routine X 2) w Reflex to ID Panel     Status: None   Collection Time: 03/11/17  6:38 PM  Result Value Ref Range Status   Specimen  Description BLOOD LEFT HAND  Final   Special Requests IN PEDIATRIC BOTTLE Blood Culture adequate volume  Final   Culture NO GROWTH 5 DAYS  Final   Report Status 03/16/2017 FINAL  Final  Culture, blood (Routine X 2) w Reflex to ID Panel     Status: None   Collection Time: 03/11/17  6:59 PM  Result Value Ref Range Status   Specimen Description BLOOD RIGHT HAND  Final   Special Requests IN PEDIATRIC BOTTLE Blood Culture adequate volume  Final   Culture NO GROWTH 5 DAYS  Final   Report Status 03/16/2017 FINAL  Final  Culture, Urine     Status: None   Collection Time: 03/12/17  9:41 AM  Result Value Ref Range Status   Specimen Description URINE, RANDOM  Final   Special Requests NONE  Final   Culture NO GROWTH  Final   Report Status 03/13/2017 FINAL  Final     Labs: BNP (last 3 results)  Recent Labs  09/29/16 1632 03/11/17 1900  BNP 520.0* 832.6*   Basic Metabolic Panel:  Recent Labs Lab 03/15/17 1055 03/16/17 0331 03/17/17 0428 03/18/17 0526 03/20/17 0353  NA 140 140 138 137 137  K 4.7 4.9 5.4* 5.1 6.2*  CL 108 108 106 107 103  CO2 20* 23 19* 17* 16*  GLUCOSE 171* 157* 198* 175* 162*  BUN 67* 70* 75* 82* 99*  CREATININE 3.99* 4.03* 4.24* 4.81* 5.56*  CALCIUM 7.3* 7.1* 7.0* 6.6* 7.6*  MG 1.8  --   --   --   --  PHOS 5.2* 5.2*  --   --   --    Liver Function Tests:  Recent Labs Lab 03/15/17 1055 03/16/17 0331 03/17/17 0428 03/18/17 0526 03/20/17 0353  AST 545* 798* 876* 537*  --   ALT 297* 459* 528* 373*  --   ALKPHOS 117 119 144* 120  --   BILITOT 15.8* 16.8* 20.2* 16.5*  --   PROT 4.9* 4.9* 5.1* 4.0*  --   ALBUMIN 2.8* 2.5* 2.4* 2.0* 2.2*   No results for input(s): LIPASE, AMYLASE in the last 168 hours.  Recent Labs Lab 03/17/17 0428 03/20/17 0353  AMMONIA 43* 33   CBC:  Recent Labs Lab 03/15/17 1055 03/16/17 0331 03/17/17 0428 03/20/17 0353  WBC 8.8 7.5 9.2 8.1  NEUTROABS  --   --   --  6.7  HGB 8.3* 7.9* 8.7* 8.0*  HCT 25.5* 24.1*  26.0* 23.0*  MCV 107.6* 104.8* 102.0* 99.6  PLT 120* 106* 101* 76*   Cardiac Enzymes: No results for input(s): CKTOTAL, CKMB, CKMBINDEX, TROPONINI in the last 168 hours. BNP: Invalid input(s): POCBNP CBG:  Recent Labs Lab 03/20/17 1214 03/20/17 1644 03/20/17 2126 03/21/17 0805 03/21/17 1151  GLUCAP 162* 157* 137* 148* 164*   D-Dimer No results for input(s): DDIMER in the last 72 hours. Hgb A1c No results for input(s): HGBA1C in the last 72 hours. Lipid Profile No results for input(s): CHOL, HDL, LDLCALC, TRIG, CHOLHDL, LDLDIRECT in the last 72 hours. Thyroid function studies No results for input(s): TSH, T4TOTAL, T3FREE, THYROIDAB in the last 72 hours.  Invalid input(s): FREET3 Anemia work up No results for input(s): VITAMINB12, FOLATE, FERRITIN, TIBC, IRON, RETICCTPCT in the last 72 hours. Urinalysis    Component Value Date/Time   COLORURINE AMBER (A) 03/12/2017 0941   APPEARANCEUR HAZY (A) 03/12/2017 0941   LABSPEC 1.014 03/12/2017 0941   PHURINE 5.0 03/12/2017 0941   GLUCOSEU NEGATIVE 03/12/2017 0941   HGBUR NEGATIVE 03/12/2017 0941   BILIRUBINUR NEGATIVE 03/12/2017 0941   KETONESUR NEGATIVE 03/12/2017 0941   PROTEINUR 30 (A) 03/12/2017 0941   NITRITE NEGATIVE 03/12/2017 0941   LEUKOCYTESUR MODERATE (A) 03/12/2017 0941   Sepsis Labs Invalid input(s): PROCALCITONIN,  WBC,  LACTICIDVEN Microbiology Recent Results (from the past 240 hour(s))  Culture, blood (Routine X 2) w Reflex to ID Panel     Status: None   Collection Time: 03/11/17  6:38 PM  Result Value Ref Range Status   Specimen Description BLOOD LEFT HAND  Final   Special Requests IN PEDIATRIC BOTTLE Blood Culture adequate volume  Final   Culture NO GROWTH 5 DAYS  Final   Report Status 03/16/2017 FINAL  Final  Culture, blood (Routine X 2) w Reflex to ID Panel     Status: None   Collection Time: 03/11/17  6:59 PM  Result Value Ref Range Status   Specimen Description BLOOD RIGHT HAND  Final   Special  Requests IN PEDIATRIC BOTTLE Blood Culture adequate volume  Final   Culture NO GROWTH 5 DAYS  Final   Report Status 03/16/2017 FINAL  Final  Culture, Urine     Status: None   Collection Time: 03/12/17  9:41 AM  Result Value Ref Range Status   Specimen Description URINE, RANDOM  Final   Special Requests NONE  Final   Culture NO GROWTH  Final   Report Status 03/13/2017 FINAL  Final    Time coordinating discharge: 35 minutes  SIGNED:  Latrelle Dodrill, MD  [facility] 03/21/2017, 3:39 PM  Pager please text page via  [email or web address] Password [email or web address]

## 2017-03-21 NOTE — Consult Note (Signed)
   [facility]   03/21/2017  Jeani SowSarah T Reis August 30, 1928 [id number]  [facility]. Spoke with social worker regarding the [facility] and will be discharged there with Hospice.  Patient will receive care management services from Hospice in their program. Will update [facility].  [facility] will close from [facility].  For questions, please contact:  Charlesetta ShanksVictoria Khadeejah Castner, RN BSN CCM [facility] Liaison  [phone] business mobile phone Toll free office [phone]

## 2017-03-21 NOTE — Clinical Social Work Note (Addendum)
Patient's family now considering [facility] [place]. CSW called and they do have a bed available today. Patient's family want to discuss with NP with palliative team before making a decision. CSW paged NP to make aware. RNCM aware.  Charlynn CourtSarah Vance Hochmuth, CSW [phone]  2:45 pm Patient's daughter called CSW and said they do want to pursue the hosp[facility] facility [facility] CSW paged MD, NP with palliative team, and RNCM to notify. CS[facility] [place] [place] [facility] [place] [facility] [place].  Charlynn CourtSarah Adonias Demore, CSW [phone]  3:37 pm [facility] can [facility] to [facility] [place] today. Family will complete paperwork at the facility. MD will update discharge summary.  Charlynn CourtSarah Amon Costilla, CSW [phone]

## 2017-03-21 NOTE — NC FL2 (Signed)
[place] MEDICAID FL2 LEVEL OF CARE SCREENING TOOL     IDENTIFICATION  Patient Name: Glenda Dickson Birthdate: 1928/06/13 Sex: female Admission Date (Current Location): 03/10/2017  [facility] [place] and [place] Number:  [employer] and Address:  [place], [address] [place], [place] [address]      Provider Number: [id number]  Attending Physician Name and Address:  Randel Pigg, Dorma Russell, MD  Relative Name and Phone Number:       Current Level of Care: Hospital Recommended Level of Care: [facility] (with hospice) Prior Approval Number:    Date Approved/Denied:   PASRR Number:    Discharge Plan: Other (Comment) [facility] with hospice)    Current Diagnoses: Patient Active Problem List   Diagnosis Date Noted  . Palliative care by specialist   . Acute respiratory failure with hypoxia (HCC)   . Jaundice   . Elevated lactic acid level   . Hypotension, unspecified   . Myxedema coma (HCC)   . Unspecified cerebral artery occlusion with cerebral infarction   . Acute kidney failure, unspecified (HCC)   . Chronic systolic congestive heart failure (HCC)   . Acidosis   . Anticoagulant long-term use   . Closed fracture of left femur (HCC)   . Pain in joint, shoulder region 03/11/2017  . Hypothyroidism 03/11/2017  . Other closed fracture of lower end of femur 03/10/2017  . Hyperkalemia 03/10/2017  . Acute renal failure superimposed on stage 4 chronic kidney disease (HCC) 03/10/2017  . Hyperbilirubinemia 03/10/2017  . Cerebellar infarct (HCC) 03/10/2017  . Dementia 10/24/2016  . Pressure injury of skin 09/30/2016  . CAP (community acquired pneumonia) 09/30/2016  . Supratherapeutic INR 09/29/2016  . Congestive heart failure, unspecified 09/29/2016  . Protein calorie malnutrition (HCC) 09/29/2016  . Leg swelling 09/29/2016  . Goals of care, counseling/discussion 11/06/2015  . Chronic kidney disease, stage IV (severe) (HCC) 08/24/2012  .  Cerebrovascular disease 07/20/2012  . Anemia 06/26/2012  . Hypertension   . Diabetes mellitus type II, non insulin dependent (HCC)   . Hyperlipidemia   . Atrial fibrillation (HCC)     Orientation RESPIRATION BLADDER Height & Weight     Self  O2 (Nasal Canula 4 L) Continent, Indwelling catheter Weight: 242 lb 12.8 oz (110.1 kg) Height:  5\' 5"  (165.1 cm)  BEHAVIORAL SYMPTOMS/MOOD NEUROLOGICAL BOWEL NUTRITION STATUS   (None)  (Dementia) Incontinent Diet (DYS 2, Fluid nectar thick: Ok to have soft foods from home; ice chips as desired, sit upright.)  AMBULATORY STATUS COMMUNICATION OF NEEDS Skin     Verbally (Delayed responses) Bruising, Other (Comment) (MASD, Weeping.)                       Personal Care Assistance Level of Assistance              Functional Limitations Info  Sight, Hearing, Speech Sight Info: Adequate Hearing Info: Adequate Speech Info: Adequate    SPECIAL CARE FACTORS FREQUENCY  Blood pressure                    Contractures Contractures Info: Not present    Additional Factors Info  Code Status, Allergies Code Status Info: DNR Allergies Info: Benzalkonium Chloride-alcohol, Codeine           Current Medications (03/21/2017):  This is the current hospital active medication list Current Facility-Administered Medications  Medication Dose Route Frequency Provider Last Rate Last Dose  . bisacodyl (  DULCOLAX) suppository 10 mg  10 mg Rectal Daily PRN Pearson GrippeKim, James, MD   10 mg at 03/13/17 1751  . chlorhexidine (PERIDEX) 0.12 % solution 15 mL  15 mL Mouth Rinse BID Lonia BloodMcClung, Jeffrey T, MD   15 mL at 03/21/17 1053  . glycopyrrolate (ROBINUL) injection 0.2 mg  0.2 mg Intravenous Q4H PRN Alita ChyleMason, Megan M, NP      . hydrocortisone sodium succinate (SOLU-CORTEF) 100 MG injection 50 mg  50 mg Intravenous Q8H [place] BloodMcClung, Jeffrey T, MD   50 mg at 03/21/17 1303  . insulin aspart (novoLOG) injection 0-9 Units  0-9 Units Subcutaneous TID WC [place]onia BloodMcClung, Jeffrey T, MD    2 Units at 03/21/17 1303  . levothyroxine (SYNTHROID, LEVOTHROID) injection 100 mcg  100 mcg Intravenous Daily [place] BloodMcClung, Jeffrey T, MD   100 mcg at 03/21/17 1053  . liothyronine (CYTOMEL) tablet 5 mcg  5 mcg Oral Q8H [place] BloodMcClung, Jeffrey T, MD   5 mcg at 03/21/17 1304  . LORazepam (ATIVAN) injection 1 mg  1 mg Intravenous Q4H PRN Alita ChyleMason, Megan M, NP      . MEDLINE mouth rinse  15 mL Mouth Rinse BID Jonah BlueYates, Jennifer, MD   15 mL at 03/21/17 1054  . morphine 4 MG/ML injection 1-2 mg  1-2 mg Intravenous Q1H PRN Alita ChyleMason, Megan M, NP      . ondansetron [facility](ZOFRAN) injection 4 mg  4 mg Intravenous Q6H PRN Jonah BlueYates, Jennifer, MD   4 mg at 03/17/17 1856  . polyethylene glycol (MIRALAX / GLYCOLAX) packet 17 g  17 g Oral Daily PRN Pearson GrippeKim, James, MD      . RESOURCE THICKENUP CLEAR   Oral PRN Jetty DuhamelMcClung, Jeffrey T, MD      . sodium chloride flush (NS) 0.9 % injection 10-40 mL  10-40 mL Intracatheter Q12H Drema DallasWoods, Curtis J, MD   10 mL at 03/21/17 1054  . sodium chloride flush (NS) 0.9 % injection 10-40 mL  10-40 mL Intracatheter PRN Drema DallasWoods, Curtis J, MD   30 mL at 03/18/17 1800     Discharge Medications: Please see discharge summary for a list of discharge medications.  Relevant Imaging Results:  Relevant Lab Results:   Additional Information SS#: [id number]  Margarito LinerSarah C Gaynor Genco, LCSW

## 2017-03-21 NOTE — Progress Notes (Signed)
report given to Ty HiltsKaren Woods, RN. [facility] [place] [facility]

## 2017-03-21 NOTE — Progress Notes (Signed)
Pt stable per MD for return to prior facility with Hospice Follow up.  Megan with Palliative Medicine Team met with family; they are agreeable to discharge, though apprehensive.  Facility willing and able to care for pt at discharge, per Jinny Blossom, NP.  Notified CSW Sharlee to coordinate return to facility; also made MD aware of arrangements.    Reinaldo Raddle, RN, BSN  Trauma/Neuro ICU Case Manager [phone]

## 2017-03-21 NOTE — Progress Notes (Signed)
Daily Progress Note   Patient Name: Glenda Dickson       Date: 03/21/2017 DOB: 10/07/1927  Age: 83 y.o. MRN#: [id number] Attending Physician: Randel Pigg, Dorma Russell, MD Primary Care Physician: Benita Stabile, MD Admit Date: 03/10/2017  Reason for Consultation/Follow-up: Establishing goals of care  Subjective/GOC: 0930: Upon arrival to room, patient awake and alert. Answers questions and follows commands. Denies pain or discomfort.   1045: Spoke with son Zenaida Niece) and daughter Aggie Cosier) in family waiting area. Verlon Au, RN at nursing facility, on phone. Again discussed in detail diagnoses, interventions, poor prognosis, and focus on comfort measures only. Discussed hospice options in detail--back to facility with hospice OR inpatient hospice facility. Discussed medications for symptom management. Family decided to return to nursing facility with hospice services. Therapeutic listening and emotional/spiritual support provided.    1405: Received call from daughter. Concern with her being stable for transfer and now would like hospice facility. Instructed RN to take vitals signs (BP 108/44). Patient is currently awake and alert. No s/s of distress. Discussed inpatient hospice facility. Patient stable for transfer.       Length of Stay: 11  Current Medications: Scheduled Meds:  . chlorhexidine  15 mL Mouth Rinse BID  . hydrocortisone sod succinate (SOLU-CORTEF) inj  50 mg Intravenous Q8H  . insulin aspart  0-9 Units Subcutaneous TID WC  . levothyroxine  100 mcg Intravenous Daily  . liothyronine  5 mcg Oral Q8H  . mouth rinse  15 mL Mouth Rinse BID  . sodium chloride flush  10-40 mL Intracatheter Q12H    Continuous Infusions:   PRN Meds: bisacodyl, glycopyrrolate, LORazepam, morphine injection,  ondansetron (ZOFRAN) IV, polyethylene glycol, RESOURCE THICKENUP CLEAR, sodium chloride flush  Physical Exam  Constitutional: She is easily aroused. She appears ill.  HENT:  Head: Normocephalic and atraumatic.  Cardiovascular: Exam reveals distant heart sounds.   Pulmonary/Chest: No accessory muscle usage. No tachypnea. No respiratory distress. She has decreased breath sounds.  Shallow respirations  Abdominal: Bowel sounds are decreased.  Musculoskeletal: She exhibits edema (anasarca).  Neurological: She is easily aroused.  Drowsy, decreased responsiveness. Opens eyes to voice.   Skin: Skin is warm.  jaundice  Psychiatric: She is inattentive.  Nursing note and vitals reviewed.          Vital Signs: BP (!) 90/54 (  BP Location: Left Arm)   Pulse (!) 133   Temp 97.5 F (36.4 C) (Oral)   Resp 16   Ht 5\' 5"  (1.651 m)   Wt 110.1 kg (242 lb 12.8 oz)   SpO2 98%   BMI 40.40 kg/m  SpO2: SpO2: 98 % O2 Device: O2 Device: Nasal Cannula O2 Flow Rate: O2 Flow Rate (L/min): 4 L/min  Intake/output summary:   Intake/Output Summary (Last 24 hours) at 03/21/17 1001 Last data filed at 03/21/17 0300  Gross per 24 hour  Intake                0 ml  Output               50 ml  Net              -50 ml   LBM: Last BM Date: 03/18/17 Baseline Weight: Weight: 88 kg (194 lb) Most recent weight: Weight: 110.1 kg (242 lb 12.8 oz)       Palliative Assessment/Data: PPS 20%   Flowsheet Rows     Most Recent Value  Intake Tab  Referral Department  Hospitalist  Unit at Time of Referral  Med/Surg Unit  Palliative Care Primary Diagnosis  Other (Comment)  Date Notified  03/18/17  Palliative Care Type  New Palliative care  Reason for referral  Clarify Goals of Care  Date of Admission  03/10/17  Date first seen by Palliative Care  03/19/17  # of days Palliative referral response time  1 Day(s)  # of days IP prior to Palliative referral  8  Clinical Assessment  Palliative Performance Scale Score   20%  Psychosocial & Spiritual Assessment  Palliative Care Outcomes  Patient/Family meeting held?  Yes  Who was at the meeting?  daughter, granddaughter  Palliative Care Outcomes  Clarified goals of care, Provided psychosocial or spiritual support, Provided end of life care assistance, Improved pain interventions, Improved non-pain symptom therapy      Patient Active Problem List   Diagnosis Date Noted  . Palliative care by specialist   . Acute respiratory failure with hypoxia (HCC)   . Jaundice   . Elevated lactic acid level   . Hypotension, unspecified   . Myxedema coma (HCC)   . Unspecified cerebral artery occlusion with cerebral infarction   . Acute kidney failure, unspecified (HCC)   . Chronic systolic congestive heart failure (HCC)   . Acidosis   . Anticoagulant long-term use   . Closed fracture of left femur (HCC)   . Pain in joint, shoulder region 03/11/2017  . Hypothyroidism 03/11/2017  . Other closed fracture of lower end of femur 03/10/2017  . Hyperkalemia 03/10/2017  . Acute renal failure superimposed on stage 4 chronic kidney disease (HCC) 03/10/2017  . Hyperbilirubinemia 03/10/2017  . Cerebellar infarct (HCC) 03/10/2017  . Dementia 10/24/2016  . Pressure injury of skin 09/30/2016  . CAP (community acquired pneumonia) 09/30/2016  . Supratherapeutic INR 09/29/2016  . Congestive heart failure, unspecified 09/29/2016  . Protein calorie malnutrition (HCC) 09/29/2016  . Leg swelling 09/29/2016  . Goals of care, counseling/discussion 11/06/2015  . Chronic kidney disease, stage IV (severe) (HCC) 08/24/2012  . Cerebrovascular disease 07/20/2012  . Anemia 06/26/2012  . Hypertension   . Diabetes mellitus type II, non insulin dependent (HCC)   . Hyperlipidemia   . Atrial fibrillation [facility](HCC)     Palliative Care Assessment & Plan   Patient Profile: 83 y.o. female  with past medical history of hypertension, hyperlipidemia,  DM, CHF, cardiomyopathy with EF 25-30%,  depression, dementia, afib, anemia, and hip fracture admitted on 03/10/2017 with leg pain s/p fall, confusion, and lethargy. Sent from [facility] ED with new left cerebellar CVA and xray revealing left distal femur metaphysis fracture. Transfer[facility]. MRI negative. Found to have severely elevated TSH, hypotension, and hypothermia. Diagnosed with myxedema coma. Labs also revealed acute kidney failure on chronic kidney disease and hepatic failure. Renal u/s negative. Abdominal u/s reveals cirrhosis. Underlying heart failure and fluid overload. Hospitalist's have spoke with family who agreed with DNR/DNI with focus on comfort. Poor prognosis. Palliative medicine consultation for goals of care.   Assessment: Severe hypothyroidism  Myxedema coma Chronic systolic CHF Afib RVR Hypotension Hyperbilirubinemia  Acute liver failure with hepatic coma Acute renal failure on chronic kidney disease Left distal femur fracture  Recommendations/Plan:  DNR/DNI  Comfort measures only. No escalation of care.  Medications as needed to ensure comfort. Patient has been agitated, especially at night per family.   Family has chosen for transfer to residential hospice facility. Discussed with SW.     Code Status: DNR/DNI   Code Status Orders        Start     Ordered   03/16/17 1213  Do not attempt resuscitation (DNR)  Continuous    Question Answer Comment  In the event of cardiac or respiratory ARREST Do not call a "code blue"   In the event of cardiac or respiratory ARREST Do not perform Intubation, CPR, defibrillation or ACLS   In the event of cardiac or respiratory ARREST Use medication by any route, position, wound care, and other measures to relive pain and suffering. May use oxygen, suction and manual treatment of airway obstruction as needed for comfort.      03/16/17 1212    Code Status History    Date Active Date Inactive Code Status Order ID Comments User Context   03/10/2017 10:58 PM  03/16/2017 12:12 PM Full Code [id number]  Jonah Blue, MD Inpatient   09/29/2016  7:08 PM 10/02/2016  9:35 PM Full Code [id number]  Briscoe Deutscher, MD ED    Advance Directive Documentation     Most Recent Value  Type of Advance Directive  Healthcare Power of Attorney  Pre-existing out of facility DNR order (yellow form or pink MOST form)  -  "MOST" Form in Place?  -       Prognosis:   < 2 weeks: likely days with multiorgan failure-acute kidney failure with worsening kidney functions, liver failure, chronic congestive heart failure with fluid overload and unable to diureses due to kidneys/hypotension, and myxedema coma. Poor nutritional and functional status.    Discharge Planning:  Hospice facility   Care plan was discussed with patient, son, daughter, and granddaughter at bedside, Dr. Edward Jolly, RN, SW, RN CM  Thank you for allowing the Palliative Medicine Team to assist in the care of this patient.   Time In/Out: 0930-1000 [id number]    Total Time Prolonged Time Billed yes      Greater than 50%  of this time was spent counseling and coordinating care related to the above assessment and plan.  Vennie Homans, FNP-C Palliative Medicine Team  Phone: [phone] Fax: [phone]  Please contact Palliative Medicine Team phone at [phone] for questions and concerns.

## 2017-03-21 NOTE — Clinical Social Work Note (Signed)
Clinical Social Work Assessment  Patient Details  Name: Glenda Dickson MRN: [id number] Date of Birth: 03-07-1928  Date of referral:  03/21/17               Reason for consult:  Discharge Planning                Permission sought to share information with:  Facility Medical sales representativeContact Representative, Family Supports Permission granted to share information::  Yes, Verbal Permission Granted  Name::     Forrest Moroneresa Jarrett  Agency::  [facility]  Relationship::  Daughter  Contact Information:  [phone]  Housing/Transportation Living arrangements for the past 2 months:   (Family Care Home) Source of Information:  Medical Team, Adult Children Patient Interpreter Needed:  None Criminal Activity/Legal Involvement Pertinent to Current Situation/Hospitalization:  No - Comment as needed Significant Relationships:  Adult Children, Other Family Members Lives with:  Facility Resident Do you feel safe going back to the place where you live?  Yes Need for family participation in patient care:  Yes (Comment)  Care giving concerns:  [facility].   Social Worker assessment / plan:  Patient oriented to self only. Patient's daughter at bedside. CSW introduced role and explained that discharge planning would be discussed. Patient's daughter confirmed that [facility] and the plan is for her to return there with hospice. The patient's daughter is very concerned that the patient is not ready for discharge yet. CSW discussed with MD who stated that her vitals have been at a stable level for her condition for 48 hours and she is stable for discharge. CSW [facility] Verlon Au(Leslie: [phone]) who stated that she will need an FL2 and discharge summary faxed to her when patient is discharged. No further concerns. CSW encouraged patient's daughter to contact CSW as needed. CSW will continue to follow patient and her family for support and  facilitate [facility].  Employment status:  Retired Health and safety inspectornsurance information:  Medicare PT Recommendations:  Not assessed at this time Information / Referral to community resources:  Other (Comment Required) ([facility])  Patient/Family's Response to care:  Patient oriented to self only. Patient's daughter agreeable to [facility] with [facility] but is concerned she is not stable for discharge yet. Patient's family supportive and involved in patient's care. Patient's daughter appreciated social work intervention.  Patient/Family's Understanding of and Emotional Response to Diagnosis, Current Treatment, and Prognosis:  Patient oriented to self only. Patient's daughter has a good understanding of the reason for admission and prognosis. Patient's daughter appears pleased with hospital care.  Emotional Assessment Appearance:  Appears stated age Attitude/Demeanor/Rapport:  Unable to Assess Affect (typically observed):  Unable to Assess Orientation:  Oriented to Self Alcohol / Substance use:  Never Used Psych involvement (Current and /or in the community):  No (Comment)  Discharge Needs  Concerns to be addressed:  Care Coordination Readmission within the last 30 days:  No Current discharge risk:  Cognitively Impaired, Terminally ill Barriers to Discharge:  No Barriers Identified   Margarito LinerSarah C Obdulio Mash, LCSW 03/21/2017, 1:21 PM

## 2017-03-25 ENCOUNTER — Other Ambulatory Visit: Payer: Self-pay | Admitting: Licensed Clinical Social Worker

## 2017-03-25 ENCOUNTER — Encounter: Payer: Self-pay | Admitting: Licensed Clinical Social Worker

## 2017-03-25 NOTE — Patient Outreach (Signed)
Assessment:  CSW received communication from [facility] regarding client status. [facility] reported to CSW that [facility] [place].  Thus, since client is now [facility] [place], CSW is [facility] on 03/25/[facility].  Plan:  CSW is [facility] on 03/25/17 since [facility] [place]  CSW to inform [name], Case Management Assistant, that CSW discharged client on [facility].  CSW to fax physician case closure letter to Dr. Margo AyeHall informing Dr. Margo AyeHall that CSW discharged client [facility] 03/25/17 [facility].  Kelton PillarMichael S.Chandler Swiderski MSW, LCSW Licensed Clinical Social Worker [facility] Care [facility] [phone]

## 2017-03-26 DIAGNOSIS — K7210 Chronic hepatic failure without coma: Secondary | ICD-10-CM | POA: Diagnosis not present

## 2017-04-04 ENCOUNTER — Ambulatory Visit: Payer: Medicare Other | Admitting: Licensed Clinical Social Worker

## 2017-04-23 DIAGNOSIS — 419620001 Death: Secondary | SNOMED CT | POA: Diagnosis not present

## 2017-04-23 DEATH — deceased

## 2017-05-02 ENCOUNTER — Ambulatory Visit: Payer: Medicare Other | Admitting: Cardiovascular Disease

## 2018-05-08 IMAGING — CR DG CHEST 1V PORT
1 series · 1 of 1 positions shown · non-contrast
Comparison: None.

CLINICAL DATA: 88-year-old with progressive generalized weakness
over the past 2 weeks associated with bilateral lower extremity
weeping edema.

EXAM:
PORTABLE CHEST 1 VIEW

[ap]
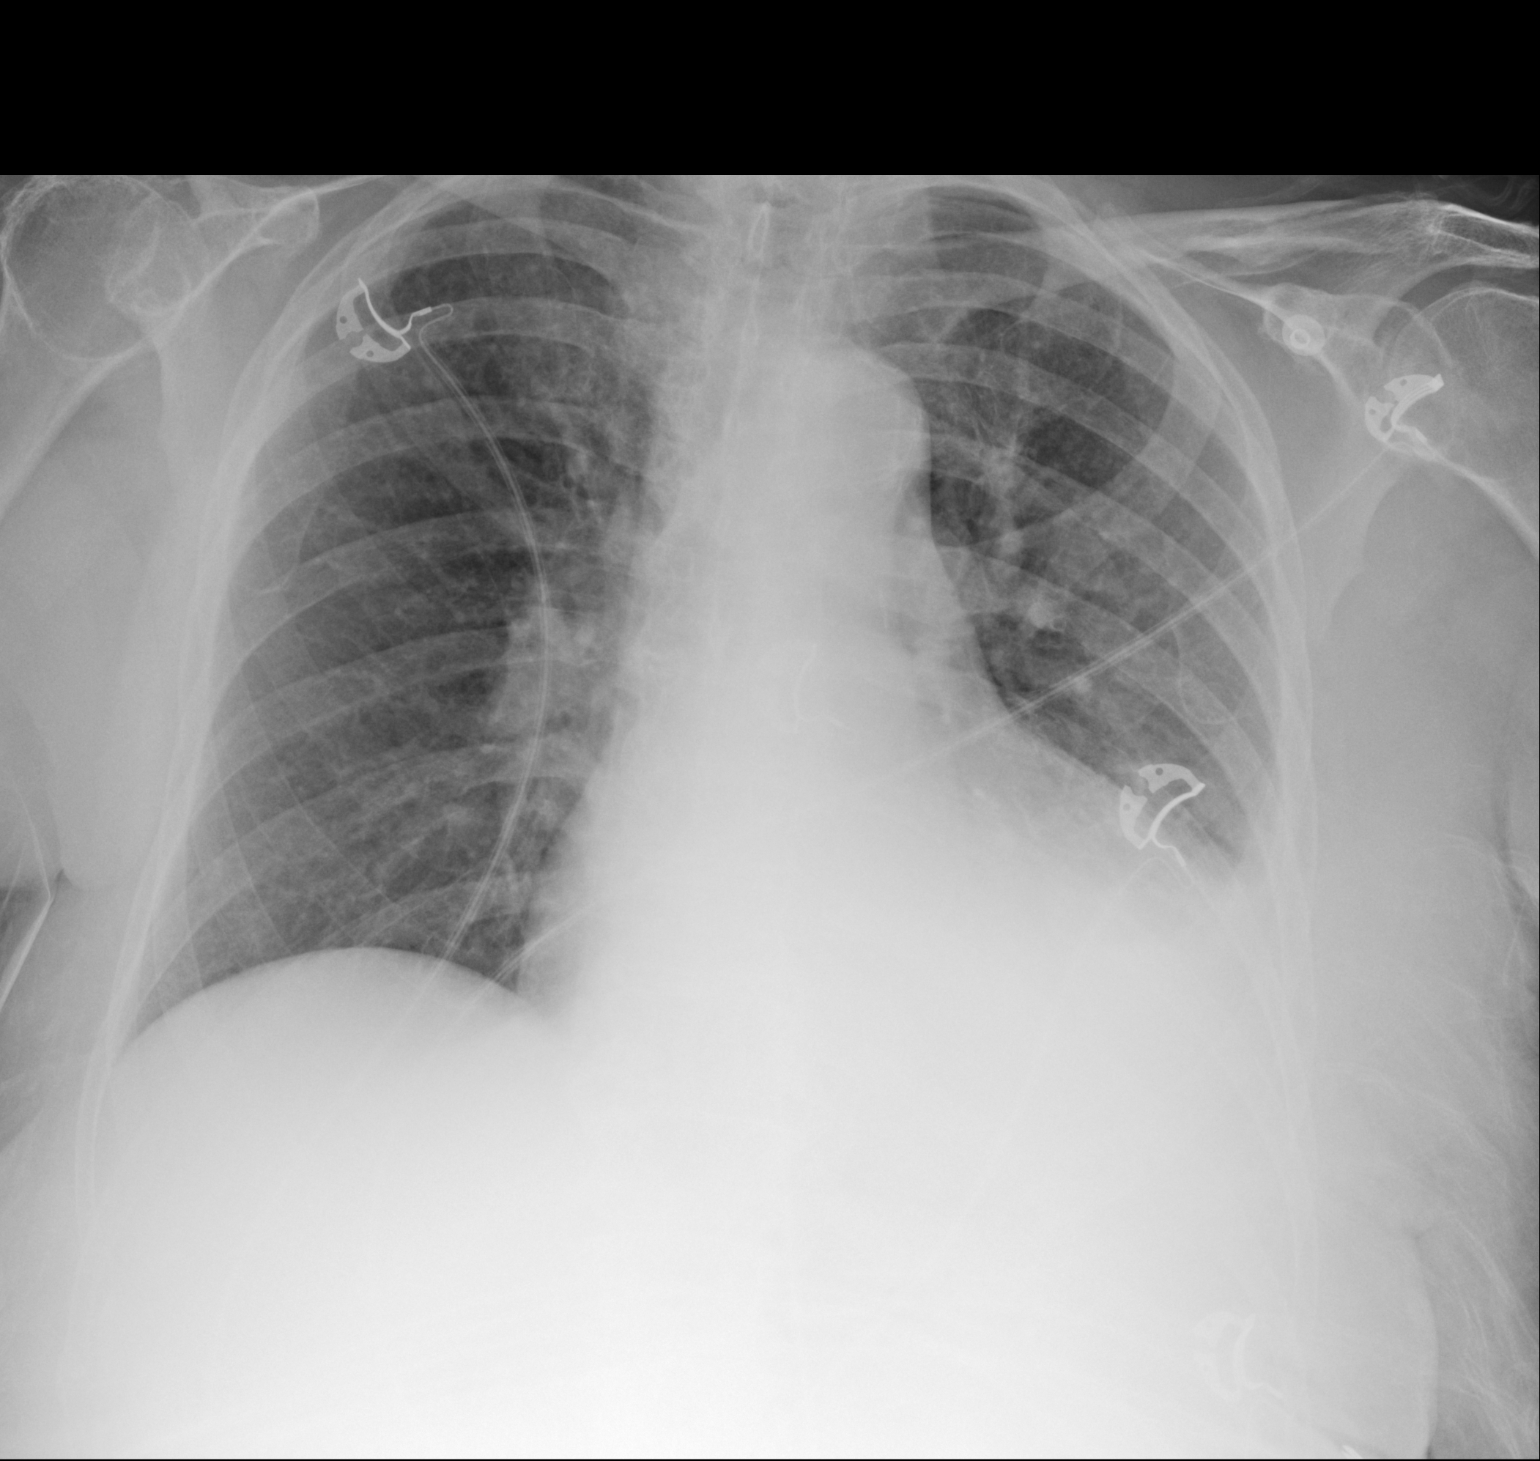

[1 of 1 positions shown; findings below may reference images not displayed]

FINDINGS: Cardiac silhouette mildly enlarged for the AP portable technique,
unchanged. Thoracic aorta atherosclerotic, unchanged. Hilar and
mediastinal contours otherwise unremarkable. Dense consolidation in
the left lower lobe associated with a left pleural effusion. Lungs
otherwise clear. No visible right pleural effusion. Pulmonary
vascularity normal. Severe degenerative changes involving the right
shoulder joint with dense calcifications involving the capsule
and/or the subacromial bursa.
IMPRESSION: 1. Left lower lobe pneumonia and associated left pleural effusion.
2. Stable mild cardiomegaly without evidence of pulmonary edema.
3. Thoracic aortic atherosclerosis.

## 2018-10-17 IMAGING — DX DG FEMUR 2+V*L*
4 series · 4 of 4 positions shown · non-contrast
Comparison: None.

CLINICAL DATA: Pain following recent fall

EXAM:
LEFT FEMUR 2 VIEWS

[femur ap (1 of 2)]
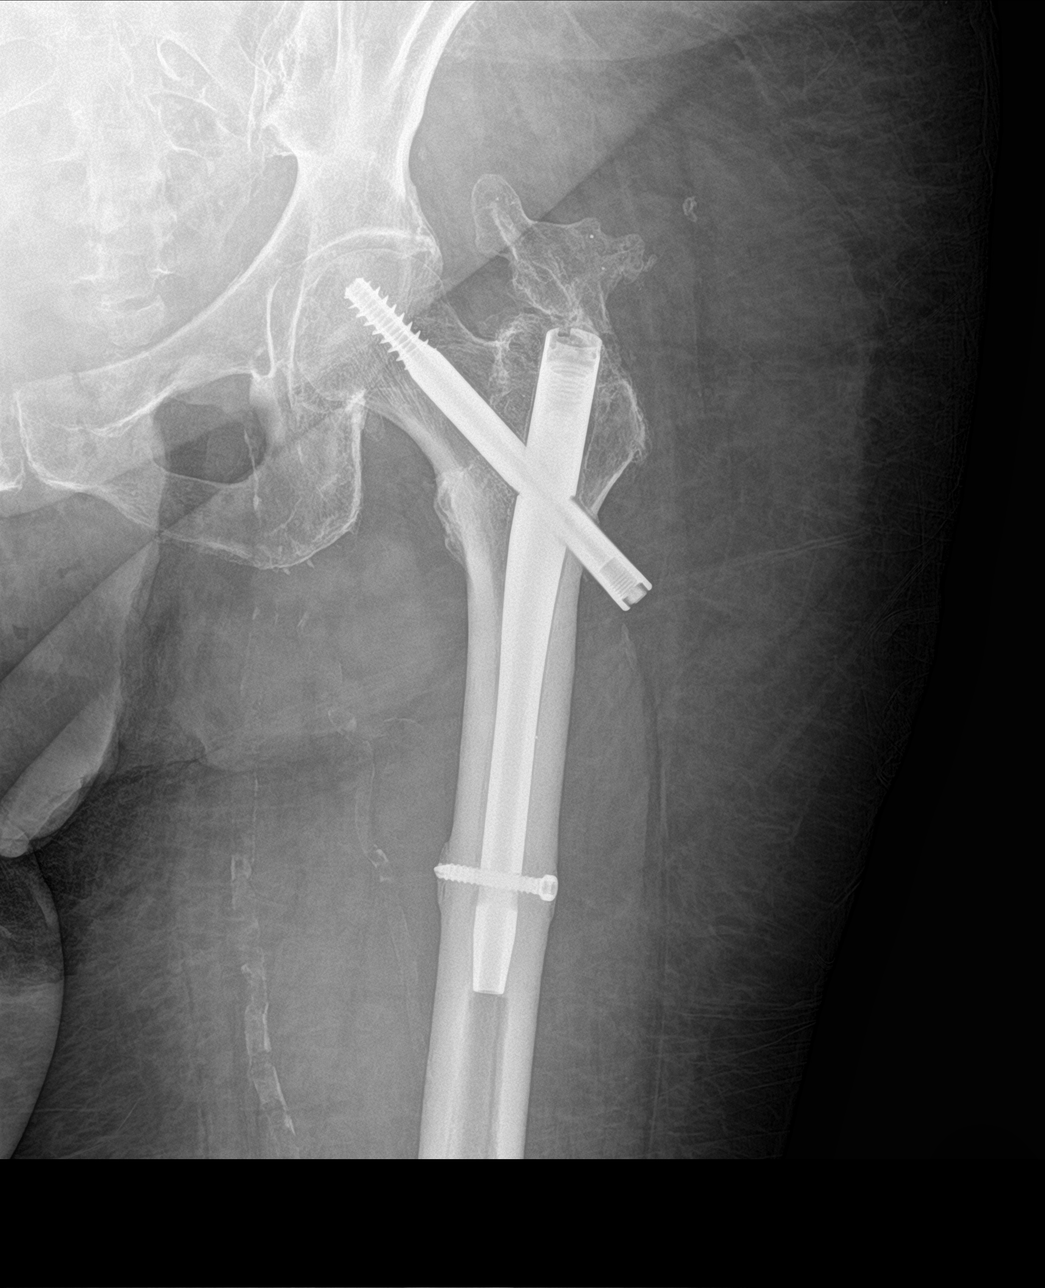

[femur ap (2 of 2)]
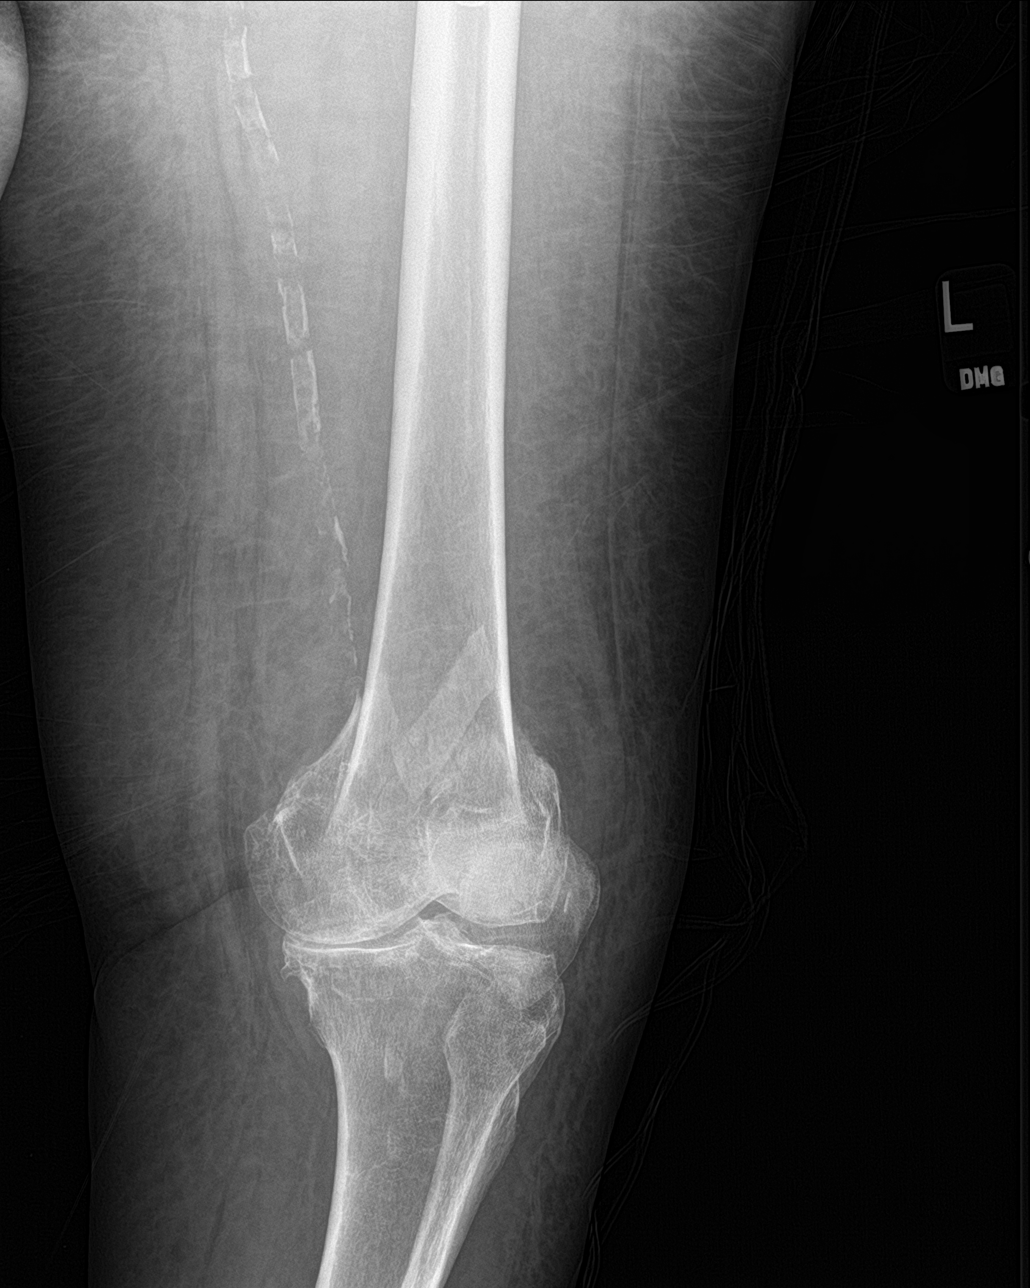

[femur lat (1 of 2)]
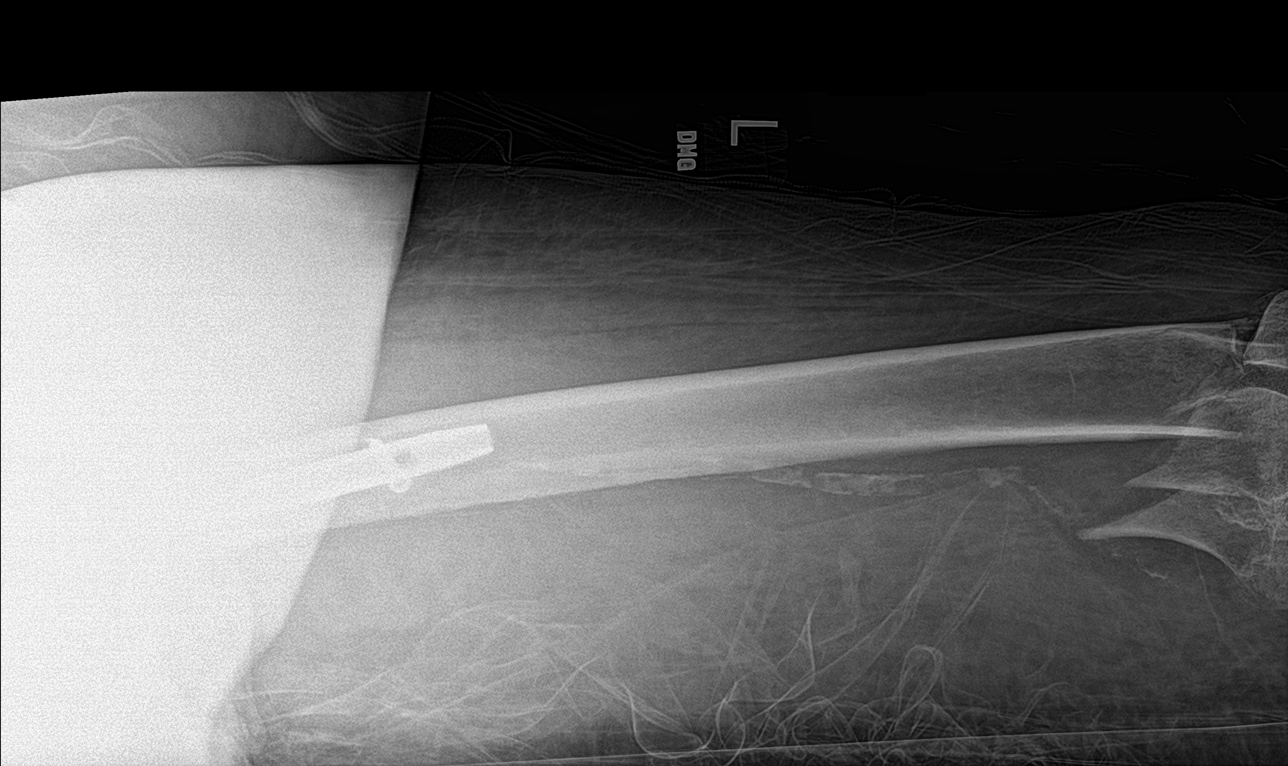

[femur lat (2 of 2)]
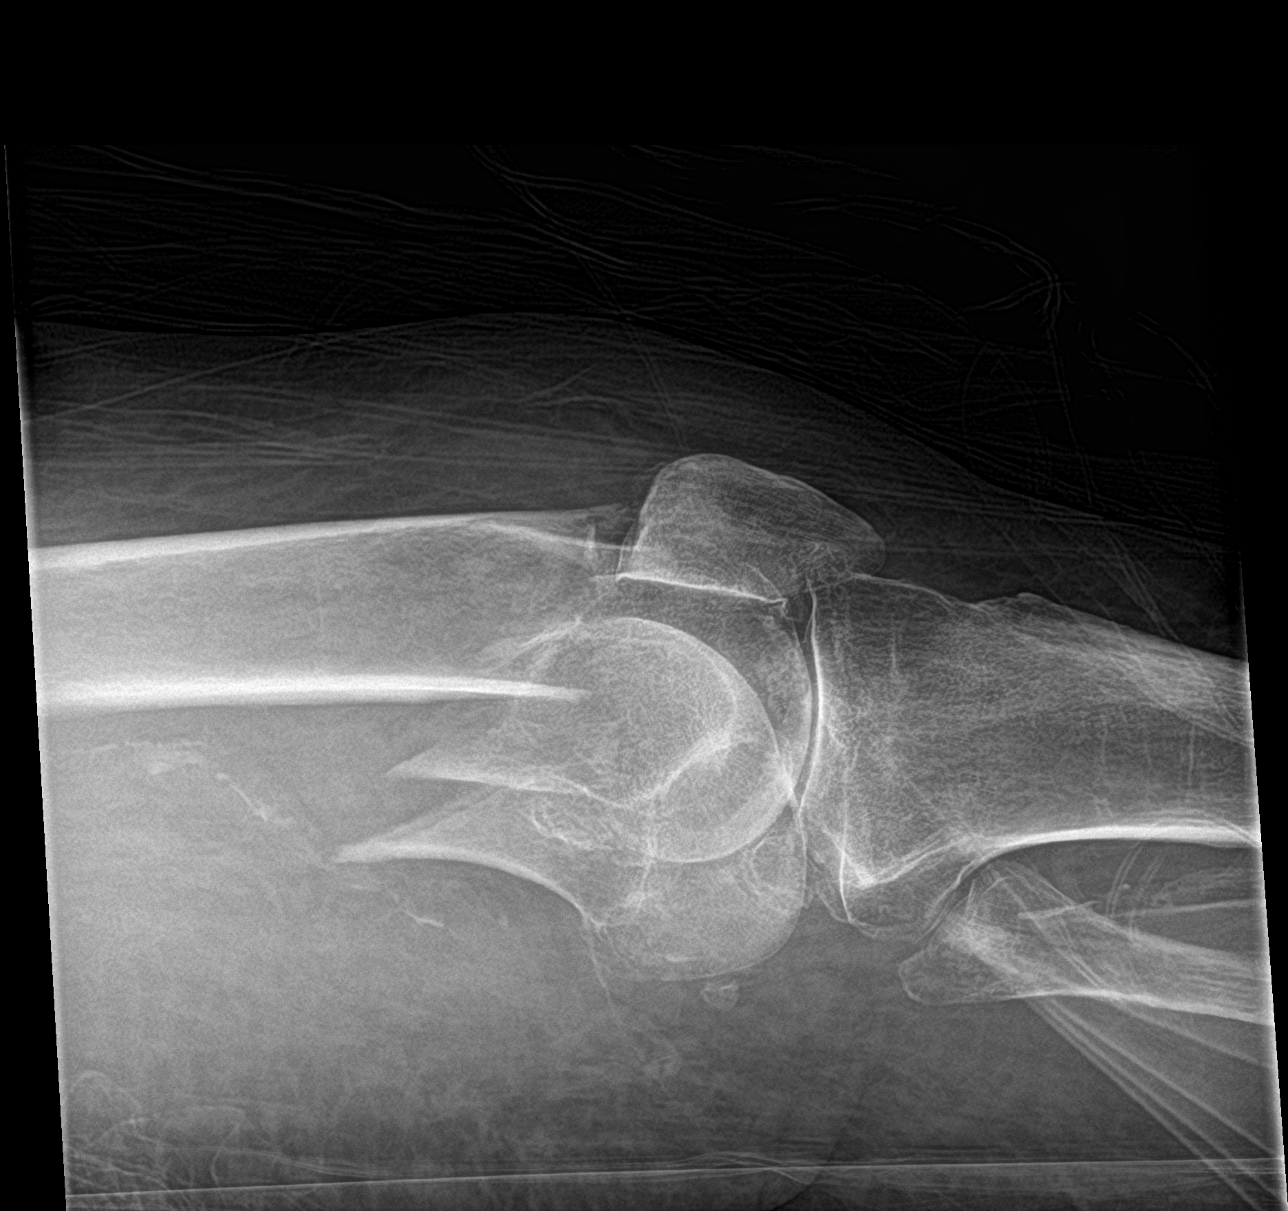

[4 of 4 positions shown; findings below may reference images not displayed]

FINDINGS: Frontal and lateral views were obtained. There is a comminuted
fracture of the distal femoral metaphysis with posterior
displacement of distal major fracture fragment with respect to
proximal major fracture fragment. More proximally, there is no acute
fracture or dislocation. There is evidence of old trauma with
postoperative fixation. There is myositis ossificans superior to the
greater trochanter. No dislocation. There is a joint effusion. There
is extensive vascular atherosclerosis.
IMPRESSION: Comminuted fracture distal femoral metaphysis with posterior
displacement of distal major fracture fragment with respect to
proximal major fragment. Advanced arthropathy in the knee joint
without frank dislocation. There is a moderate joint effusion.
Postoperative change proximally without acute fracture or
dislocation proximally. There is extensive femoral artery
atherosclerosis.

## 2018-10-17 IMAGING — DX DG HIP (WITH OR WITHOUT PELVIS) 2-3V*L*
3 series · 4 of 4 positions shown · non-contrast
Comparison: None.

CLINICAL DATA: Pain.  Multiple recent falls

EXAM:
DG HIP (WITH OR WITHOUT PELVIS) 2-3V LEFT

[hip ap]
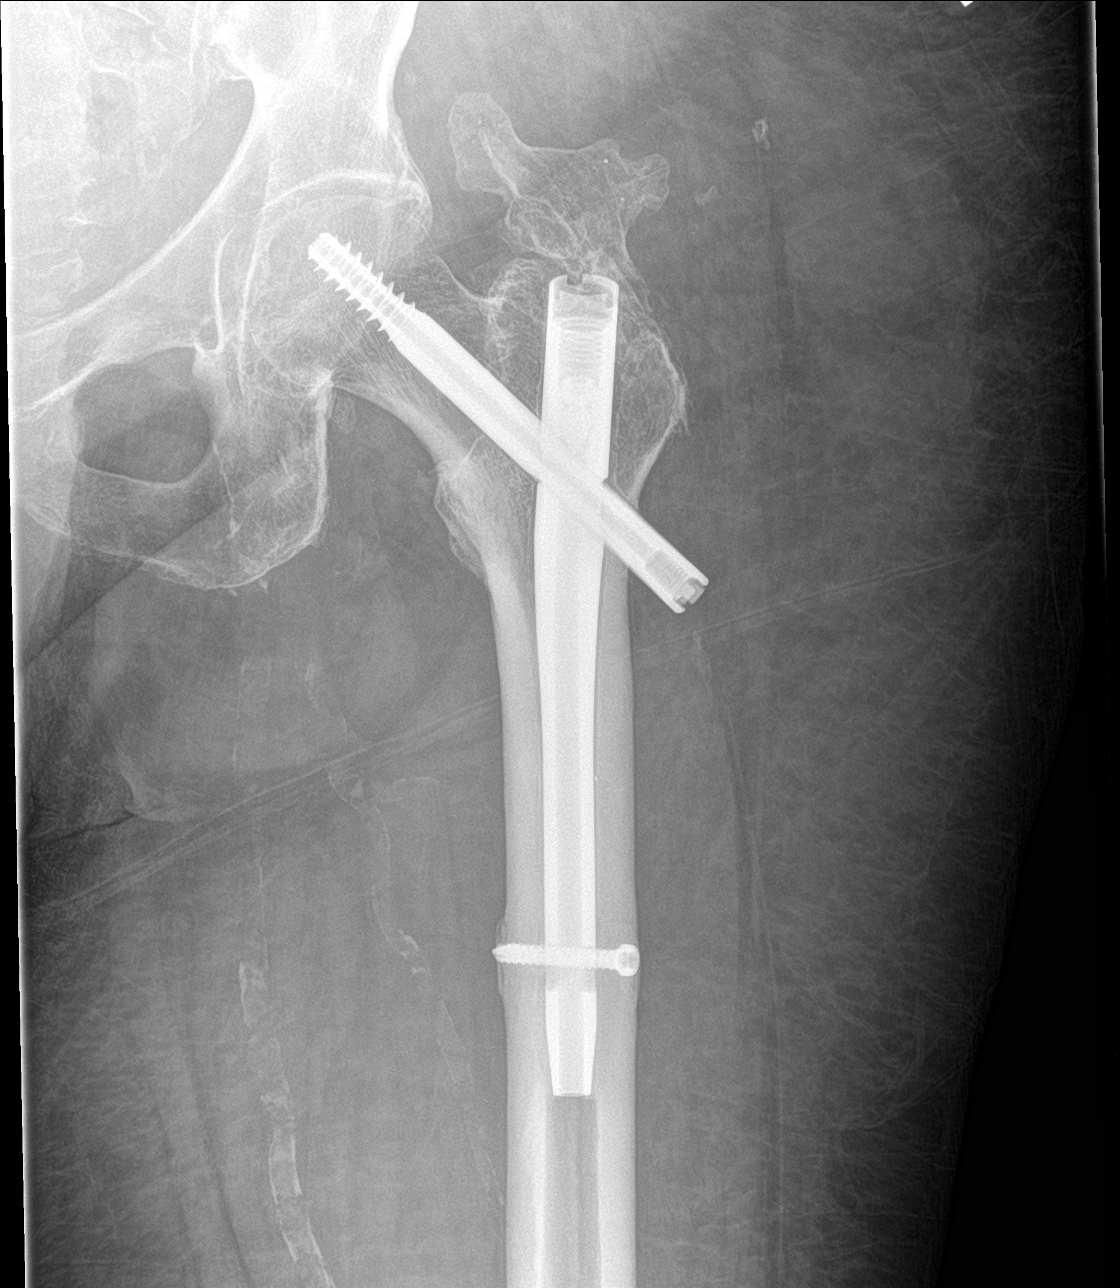

[Series 3: hip lat · 0.14mm/px · 2 of 2 slices shown]
[im 1/2]
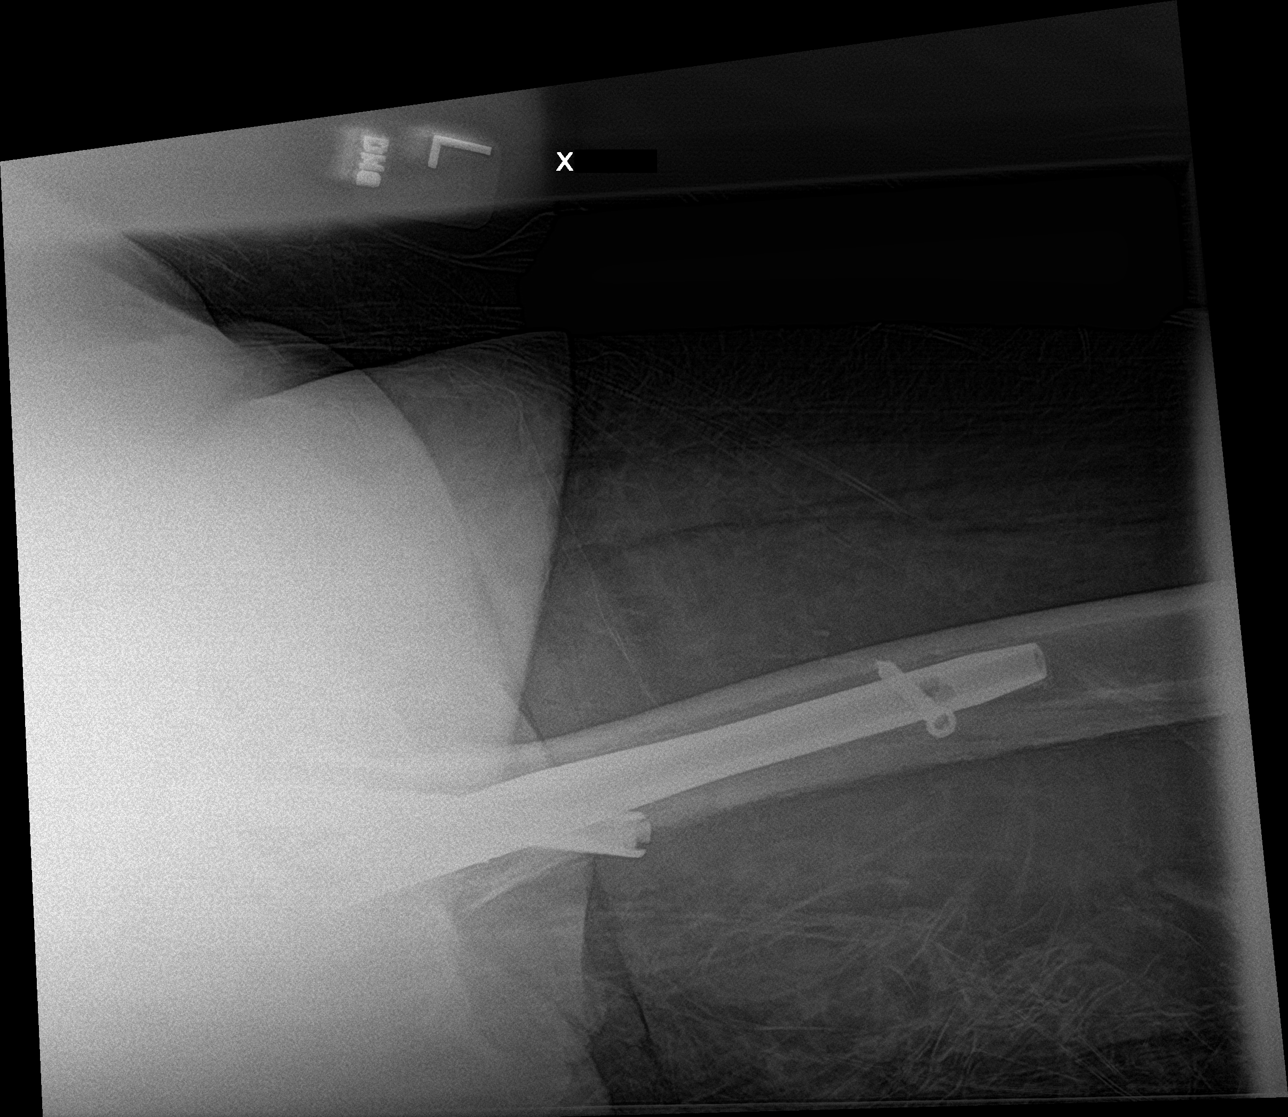
[im 2/2]
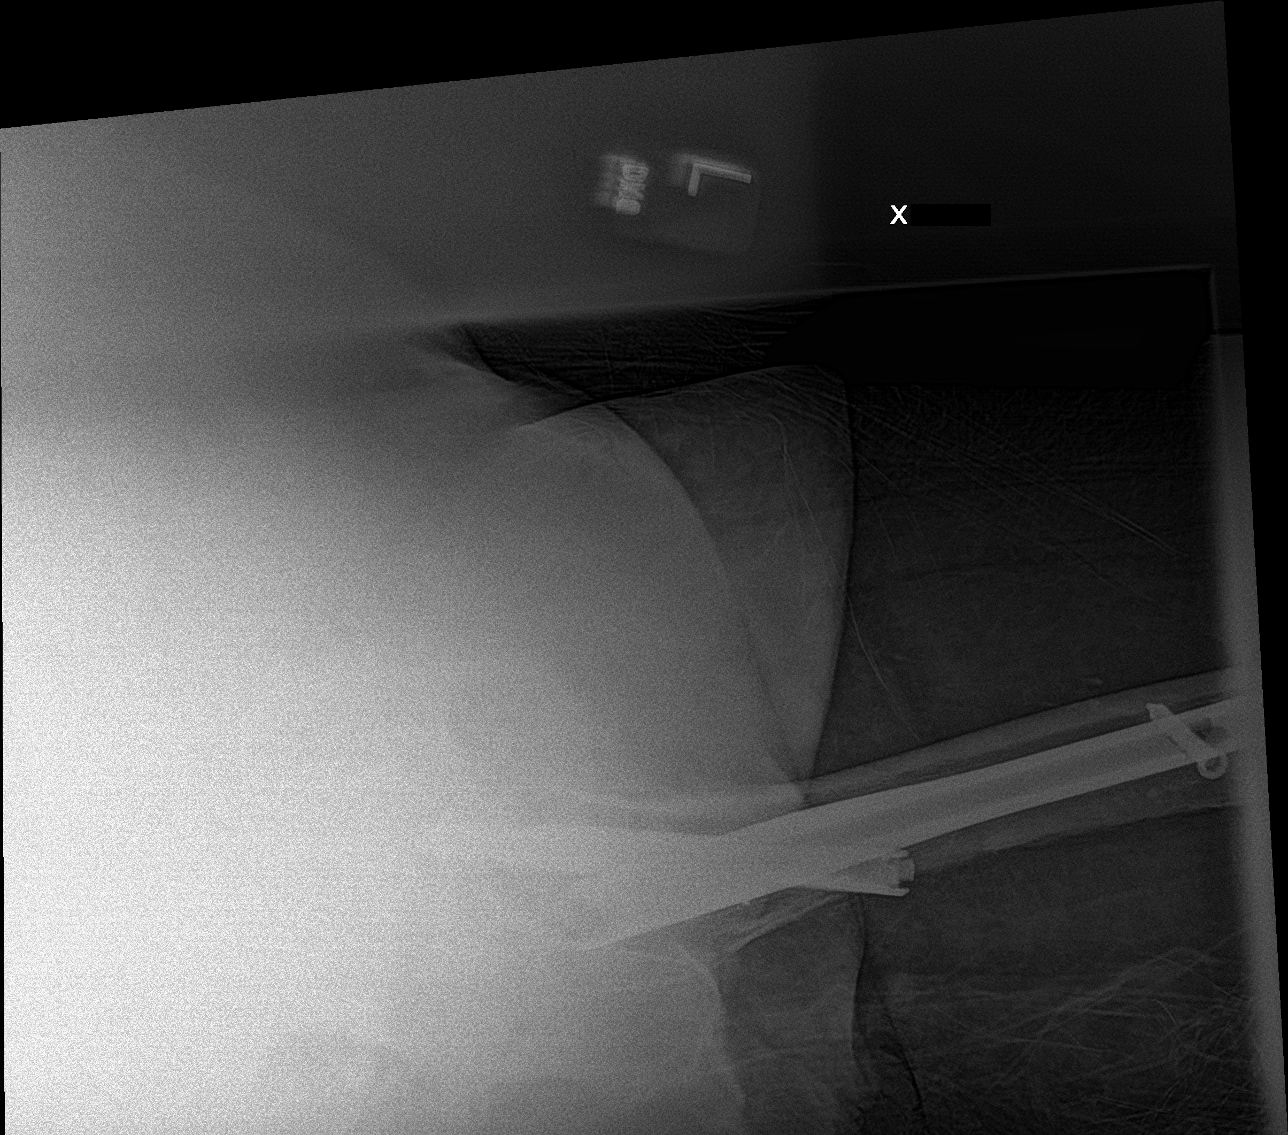

[pelvis ap]
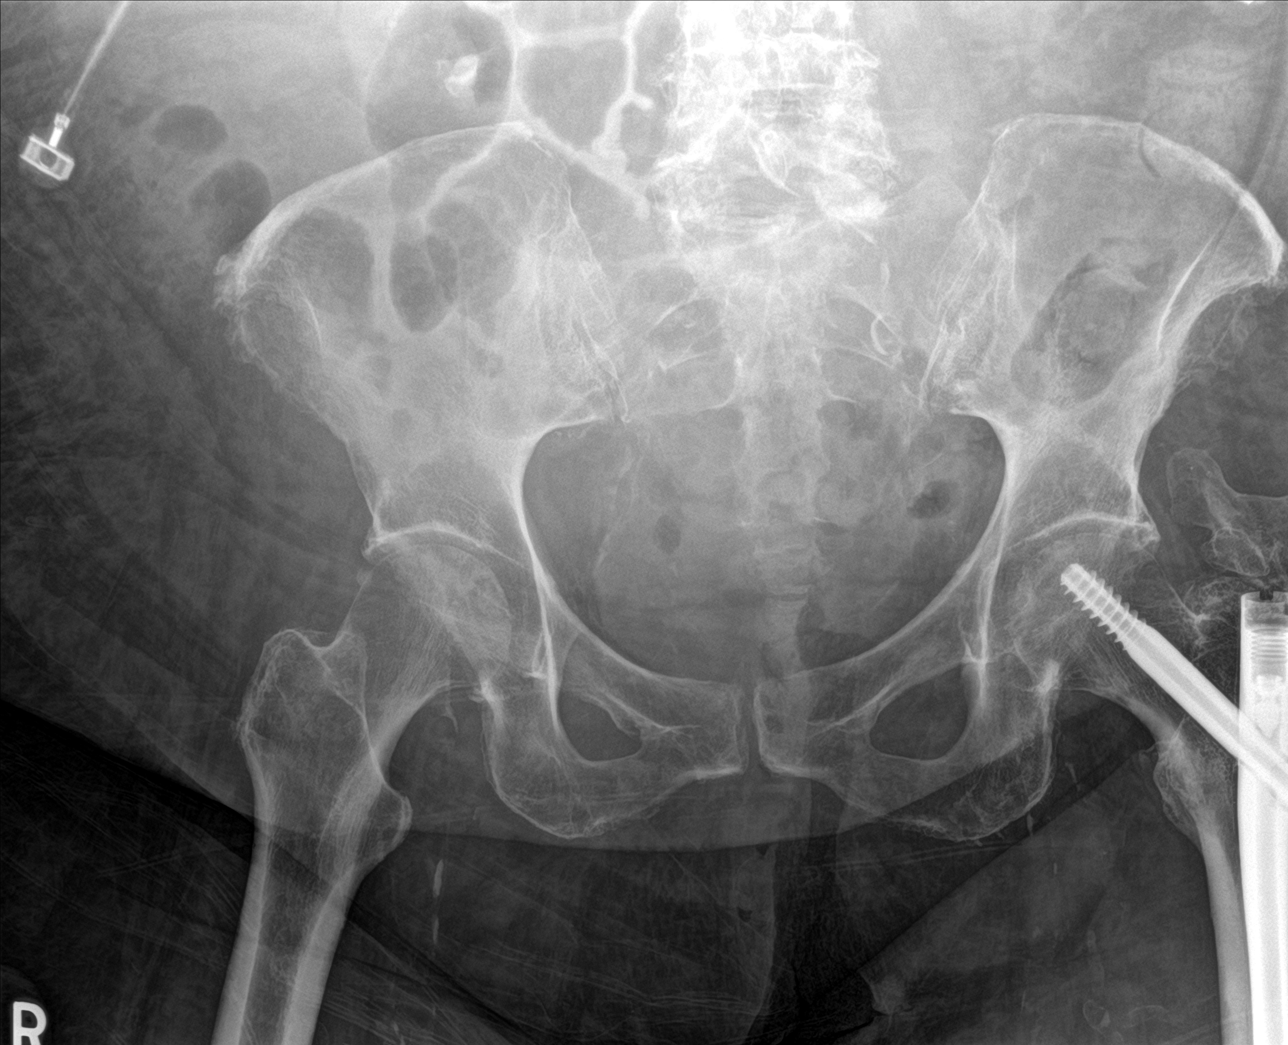

[4 of 4 positions shown; findings below may reference images not displayed]

FINDINGS: Frontal pelvis as well as frontal and lateral left hip images or
obtained. There is postoperative change in the proximal left femur.
Tip of screw is in the proximal femoral head. There is myositis
ossificans dorsal to the greater tuberosity. There is mild symmetric
narrowing of both hip joints. There is no acute fracture or
dislocation. There is extensive aortoiliac and bilateral common and
superficial femoral artery atherosclerosis.
IMPRESSION: No acute fracture or dislocation. Mild narrowing of both hip joints.
Myositis ossifications superior to the left greater trochanter.
Postoperative change on the left. Extensive aortoiliac and femoral
artery atherosclerosis.
